# Patient Record
Sex: Male | Born: 1950 | Race: Black or African American | Hispanic: No | Marital: Married | Smoking: Former smoker
Health system: Southern US, Community
[De-identification: ages and names within clinical notes are randomized; demographics above are authoritative.]

## PROBLEM LIST (undated history)

## (undated) DIAGNOSIS — R0602 Shortness of breath: Secondary | ICD-10-CM

## (undated) DIAGNOSIS — I639 Cerebral infarction, unspecified: Secondary | ICD-10-CM

## (undated) DIAGNOSIS — I10 Essential (primary) hypertension: Secondary | ICD-10-CM

## (undated) DIAGNOSIS — R569 Unspecified convulsions: Secondary | ICD-10-CM

## (undated) DIAGNOSIS — R51 Headache: Secondary | ICD-10-CM

## (undated) DIAGNOSIS — F1010 Alcohol abuse, uncomplicated: Secondary | ICD-10-CM

## (undated) DIAGNOSIS — D649 Anemia, unspecified: Secondary | ICD-10-CM

## (undated) DIAGNOSIS — T8859XA Other complications of anesthesia, initial encounter: Secondary | ICD-10-CM

## (undated) DIAGNOSIS — J449 Chronic obstructive pulmonary disease, unspecified: Secondary | ICD-10-CM

## (undated) DIAGNOSIS — N189 Chronic kidney disease, unspecified: Secondary | ICD-10-CM

## (undated) DIAGNOSIS — M4800 Spinal stenosis, site unspecified: Secondary | ICD-10-CM

## (undated) DIAGNOSIS — K219 Gastro-esophageal reflux disease without esophagitis: Secondary | ICD-10-CM

## (undated) DIAGNOSIS — Z9889 Other specified postprocedural states: Secondary | ICD-10-CM

## (undated) DIAGNOSIS — F32A Depression, unspecified: Secondary | ICD-10-CM

## (undated) HISTORY — PX: HERNIA REPAIR: SHX51

## (undated) HISTORY — PX: COLON SURGERY: SHX602

---
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
## 2010-03-25 ENCOUNTER — Inpatient Hospital Stay (HOSPITAL_COMMUNITY)
Admission: EM | Admit: 2010-03-25 | Discharge: 2010-03-27 | Payer: Self-pay | Source: Home / Self Care | Attending: Pulmonary Disease | Admitting: Pulmonary Disease

## 2010-03-26 ENCOUNTER — Encounter (INDEPENDENT_AMBULATORY_CARE_PROVIDER_SITE_OTHER): Payer: Self-pay | Admitting: Pulmonary Disease

## 2010-06-09 LAB — CULTURE, BLOOD (ROUTINE X 2): Culture  Setup Time: 201112272320

## 2010-06-09 LAB — BASIC METABOLIC PANEL
BUN: 17 mg/dL (ref 6–23)
BUN: 19 mg/dL (ref 6–23)
BUN: 26 mg/dL — ABNORMAL HIGH (ref 6–23)
BUN: 35 mg/dL — ABNORMAL HIGH (ref 6–23)
BUN: 45 mg/dL — ABNORMAL HIGH (ref 6–23)
BUN: 8 mg/dL (ref 6–23)
CO2: 14 mEq/L — ABNORMAL LOW (ref 19–32)
CO2: 17 mEq/L — ABNORMAL LOW (ref 19–32)
CO2: 18 mEq/L — ABNORMAL LOW (ref 19–32)
CO2: 19 mEq/L (ref 19–32)
CO2: 19 mEq/L (ref 19–32)
CO2: 7 mEq/L — CL (ref 19–32)
Calcium: 8.8 mg/dL (ref 8.4–10.5)
Calcium: 8.8 mg/dL (ref 8.4–10.5)
Calcium: 8.9 mg/dL (ref 8.4–10.5)
Calcium: 8.9 mg/dL (ref 8.4–10.5)
Chloride: 108 mEq/L (ref 96–112)
Chloride: 111 mEq/L (ref 96–112)
Chloride: 117 mEq/L — ABNORMAL HIGH (ref 96–112)
Chloride: 118 mEq/L — ABNORMAL HIGH (ref 96–112)
Creatinine, Ser: 1.07 mg/dL (ref 0.4–1.5)
Creatinine, Ser: 1.19 mg/dL (ref 0.4–1.5)
Creatinine, Ser: 1.44 mg/dL (ref 0.4–1.5)
Creatinine, Ser: 2.14 mg/dL — ABNORMAL HIGH (ref 0.4–1.5)
GFR calc Af Amer: >60 mL/min (ref >60)
GFR calc non Af Amer: 41 mL/min — ABNORMAL LOW (ref >60)
GFR calc non Af Amer: >60 mL/min (ref >60)
GFR calc non Af Amer: >60 mL/min (ref >60)
GFR calc non Af Amer: >60 mL/min (ref >60)
Glucose, Bld: 107 mg/dL — ABNORMAL HIGH (ref 70–99)
Glucose, Bld: 159 mg/dL — ABNORMAL HIGH (ref 70–99)
Glucose, Bld: 161 mg/dL — ABNORMAL HIGH (ref 70–99)
Glucose, Bld: 190 mg/dL — ABNORMAL HIGH (ref 70–99)
Glucose, Bld: 254 mg/dL — ABNORMAL HIGH (ref 70–99)
Glucose, Bld: 92 mg/dL (ref 70–99)
Potassium: 3.1 mEq/L — ABNORMAL LOW (ref 3.5–5.1)
Potassium: 3.5 mEq/L (ref 3.5–5.1)
Potassium: 4.4 mEq/L (ref 3.5–5.1)
Sodium: 137 mEq/L (ref 135–145)
Sodium: 137 mEq/L (ref 135–145)
Sodium: 145 mEq/L (ref 135–145)

## 2010-06-09 LAB — RAPID URINE DRUG SCREEN, HOSP PERFORMED
Amphetamines: NOT DETECTED
Barbiturates: NOT DETECTED
Opiates: NOT DETECTED
Tetrahydrocannabinol: NOT DETECTED

## 2010-06-09 LAB — COMPREHENSIVE METABOLIC PANEL
ALT: 21 U/L (ref 0–53)
Albumin: 3.6 g/dL (ref 3.5–5.2)
BUN: 53 mg/dL — ABNORMAL HIGH (ref 6–23)
Calcium: 9.7 mg/dL (ref 8.4–10.5)
Total Bilirubin: 1.9 mg/dL — ABNORMAL HIGH (ref 0.3–1.2)
Total Protein: 8.3 g/dL (ref 6.0–8.3)

## 2010-06-09 LAB — URINE CULTURE

## 2010-06-09 LAB — PROTIME-INR
INR: 1.03 (ref 0.00–1.49)
Prothrombin Time: 13.7 seconds (ref 11.6–15.2)

## 2010-06-09 LAB — GLUCOSE, CAPILLARY
Glucose-Capillary: 124 mg/dL — ABNORMAL HIGH (ref 70–99)
Glucose-Capillary: 127 mg/dL — ABNORMAL HIGH (ref 70–99)
Glucose-Capillary: 150 mg/dL — ABNORMAL HIGH (ref 70–99)
Glucose-Capillary: 157 mg/dL — ABNORMAL HIGH (ref 70–99)
Glucose-Capillary: 159 mg/dL — ABNORMAL HIGH (ref 70–99)
Glucose-Capillary: 162 mg/dL — ABNORMAL HIGH (ref 70–99)
Glucose-Capillary: 171 mg/dL — ABNORMAL HIGH (ref 70–99)
Glucose-Capillary: 196 mg/dL — ABNORMAL HIGH (ref 70–99)
Glucose-Capillary: 208 mg/dL — ABNORMAL HIGH (ref 70–99)
Glucose-Capillary: 246 mg/dL — ABNORMAL HIGH (ref 70–99)
Glucose-Capillary: 255 mg/dL — ABNORMAL HIGH (ref 70–99)
Glucose-Capillary: 269 mg/dL — ABNORMAL HIGH (ref 70–99)
Glucose-Capillary: 353 mg/dL — ABNORMAL HIGH (ref 70–99)
Glucose-Capillary: 368 mg/dL — ABNORMAL HIGH (ref 70–99)
Glucose-Capillary: >600 mg/dL (ref 70–99)

## 2010-06-09 LAB — CBC
HCT: 40.3 % (ref 39.0–52.0)
Hemoglobin: 14 g/dL (ref 13.0–17.0)
Hemoglobin: 16.1 g/dL (ref 13.0–17.0)
MCH: 32.4 pg (ref 26.0–34.0)
MCHC: 33.9 g/dL (ref 30.0–36.0)
MCV: 92 fL (ref 78.0–100.0)
Platelets: 245 10*3/uL (ref 150–400)
RBC: 4.38 MIL/uL (ref 4.22–5.81)
RBC: 4.97 MIL/uL (ref 4.22–5.81)
RDW: 13.7 % (ref 11.5–15.5)
WBC: 14.8 10*3/uL — ABNORMAL HIGH (ref 4.0–10.5)
WBC: 19.7 10*3/uL — ABNORMAL HIGH (ref 4.0–10.5)

## 2010-06-09 LAB — POCT I-STAT, CHEM 8
BUN: 53 mg/dL — ABNORMAL HIGH (ref 6–23)
Calcium, Ion: 1.14 mmol/L (ref 1.12–1.32)
Glucose, Bld: 691 mg/dL (ref 70–99)
Hemoglobin: 17.3 g/dL — ABNORMAL HIGH (ref 13.0–17.0)
Sodium: 131 mEq/L — ABNORMAL LOW (ref 135–145)
TCO2: 8 mmol/L (ref 0–100)

## 2010-06-09 LAB — BRAIN NATRIURETIC PEPTIDE: Pro B Natriuretic peptide (BNP): 66 pg/mL (ref 0.0–100.0)

## 2010-06-09 LAB — DIFFERENTIAL
Basophils Absolute: 0 10*3/uL (ref 0.0–0.1)
Eosinophils Relative: 0 % (ref 0–5)
Lymphocytes Relative: 10 % — ABNORMAL LOW (ref 12–46)

## 2010-06-09 LAB — AMYLASE: Amylase: 146 U/L — ABNORMAL HIGH (ref 0–105)

## 2010-06-09 LAB — PHOSPHORUS: Phosphorus: 3.6 mg/dL (ref 2.3–4.6)

## 2010-06-09 LAB — CARDIAC PANEL(CRET KIN+CKTOT+MB+TROPI)
Relative Index: 2.8 — ABNORMAL HIGH (ref 0.0–2.5)
Troponin I: 0.06 ng/mL (ref 0.00–0.06)

## 2010-06-09 LAB — POCT I-STAT 3, ART BLOOD GAS (G3+)
TCO2: 5 mmol/L (ref 0–100)
pCO2 arterial: 13.4 mmHg — CL (ref 35.0–45.0)
pO2, Arterial: 148 mmHg — ABNORMAL HIGH (ref 80.0–100.0)

## 2010-06-09 LAB — MAGNESIUM: Magnesium: 1.8 mg/dL (ref 1.5–2.5)

## 2010-06-09 LAB — LEGIONELLA ANTIGEN, URINE

## 2010-06-09 LAB — URINALYSIS, ROUTINE W REFLEX MICROSCOPIC: Leukocytes, UA: NEGATIVE

## 2010-06-09 LAB — TROPONIN I: Troponin I: 0.06 ng/mL (ref 0.00–0.06)

## 2010-06-09 LAB — URINE MICROSCOPIC-ADD ON

## 2010-06-09 LAB — CK TOTAL AND CKMB (NOT AT ARMC): Total CK: 282 U/L — ABNORMAL HIGH (ref 7–232)

## 2010-06-26 ENCOUNTER — Encounter (INDEPENDENT_AMBULATORY_CARE_PROVIDER_SITE_OTHER): Payer: Self-pay | Admitting: Family Medicine

## 2010-06-26 LAB — CONVERTED CEMR LAB
ALT: 15 units/L (ref 0–53)
AST: 24 units/L (ref 0–37)
Albumin: 4.3 g/dL (ref 3.5–5.2)
Alkaline Phosphatase: 49 units/L (ref 39–117)
LDL Cholesterol: 99 mg/dL (ref 0–99)
PSA: 1.78 ng/mL (ref <=4.00)
Potassium: 4.8 meq/L (ref 3.5–5.3)
Sodium: 139 meq/L (ref 135–145)
Total Bilirubin: 0.6 mg/dL (ref 0.3–1.2)
Total Protein: 6.9 g/dL (ref 6.0–8.3)
Triglycerides: 57 mg/dL (ref <150)
VLDL: 11 mg/dL (ref 0–40)

## 2011-06-04 ENCOUNTER — Observation Stay (HOSPITAL_COMMUNITY)
Admission: EM | Admit: 2011-06-04 | Discharge: 2011-06-04 | Disposition: A | Discharge status: Home Health | Payer: Self-pay | Attending: Emergency Medicine | Admitting: Emergency Medicine

## 2011-06-04 ENCOUNTER — Encounter (HOSPITAL_COMMUNITY): Payer: Self-pay | Admitting: Emergency Medicine

## 2011-06-04 DIAGNOSIS — Z9119 Patient's noncompliance with other medical treatment and regimen: Secondary | ICD-10-CM | POA: Insufficient documentation

## 2011-06-04 DIAGNOSIS — Z794 Long term (current) use of insulin: Secondary | ICD-10-CM | POA: Insufficient documentation

## 2011-06-04 DIAGNOSIS — E860 Dehydration: Principal | ICD-10-CM | POA: Insufficient documentation

## 2011-06-04 DIAGNOSIS — I10 Essential (primary) hypertension: Secondary | ICD-10-CM | POA: Insufficient documentation

## 2011-06-04 DIAGNOSIS — R112 Nausea with vomiting, unspecified: Secondary | ICD-10-CM | POA: Insufficient documentation

## 2011-06-04 DIAGNOSIS — Z79899 Other long term (current) drug therapy: Secondary | ICD-10-CM | POA: Insufficient documentation

## 2011-06-04 DIAGNOSIS — Z91199 Patient's noncompliance with other medical treatment and regimen due to unspecified reason: Secondary | ICD-10-CM | POA: Insufficient documentation

## 2011-06-04 DIAGNOSIS — E119 Type 2 diabetes mellitus without complications: Secondary | ICD-10-CM | POA: Insufficient documentation

## 2011-06-04 DIAGNOSIS — R51 Headache: Secondary | ICD-10-CM | POA: Insufficient documentation

## 2011-06-04 HISTORY — DX: Essential (primary) hypertension: I10

## 2011-06-04 LAB — POCT I-STAT 3, VENOUS BLOOD GAS (G3P V)
Bicarbonate: 27.3 mEq/L — ABNORMAL HIGH (ref 20.0–24.0)
TCO2: 29 mmol/L (ref 0–100)
pH, Ven: 7.354 — ABNORMAL HIGH (ref 7.250–7.300)
pO2, Ven: 19 mmHg — CL (ref 30.0–45.0)

## 2011-06-04 LAB — DIFFERENTIAL
Basophils Absolute: 0 10*3/uL (ref 0.0–0.1)
Basophils Relative: 0 % (ref 0–1)
Eosinophils Absolute: 0.1 10*3/uL (ref 0.0–0.7)
Monocytes Relative: 7 % (ref 3–12)
Neutro Abs: 4.1 10*3/uL (ref 1.7–7.7)
Neutrophils Relative %: 66 % (ref 43–77)

## 2011-06-04 LAB — GLUCOSE, RANDOM: Glucose, Bld: 228 mg/dL — ABNORMAL HIGH (ref 70–99)

## 2011-06-04 LAB — GLUCOSE, CAPILLARY
Glucose-Capillary: 211 mg/dL — ABNORMAL HIGH (ref 70–99)
Glucose-Capillary: 211 mg/dL — ABNORMAL HIGH (ref 70–99)

## 2011-06-04 LAB — URINE MICROSCOPIC-ADD ON

## 2011-06-04 LAB — COMPREHENSIVE METABOLIC PANEL
AST: 15 U/L (ref 0–37)
Albumin: 3.7 g/dL (ref 3.5–5.2)
Alkaline Phosphatase: 78 U/L (ref 39–117)
BUN: 14 mg/dL (ref 6–23)
Chloride: 94 mEq/L — ABNORMAL LOW (ref 96–112)
Creatinine, Ser: 0.99 mg/dL (ref 0.50–1.35)
Potassium: 3.8 mEq/L (ref 3.5–5.1)
Total Bilirubin: 0.5 mg/dL (ref 0.3–1.2)
Total Protein: 7.5 g/dL (ref 6.0–8.3)

## 2011-06-04 LAB — CBC
MCH: 33 pg (ref 26.0–34.0)
MCHC: 35.6 g/dL (ref 30.0–36.0)
RDW: 13.4 % (ref 11.5–15.5)

## 2011-06-04 LAB — URINALYSIS, ROUTINE W REFLEX MICROSCOPIC
Glucose, UA: >1000 mg/dL — AB
Leukocytes, UA: NEGATIVE
pH: 6.5 (ref 5.0–8.0)

## 2011-06-04 MED ORDER — SODIUM CHLORIDE 0.9 % IV SOLN
INTRAVENOUS | Status: DC
  Administered 2011-06-04: 12:00:00 via INTRAVENOUS

## 2011-06-04 MED ORDER — SODIUM CHLORIDE 0.9 % IV BOLUS (SEPSIS)
1000.0000 mL | Freq: Once | INTRAVENOUS | Status: AC
  Administered 2011-06-04: 1000 mL via INTRAVENOUS

## 2011-06-04 MED ORDER — ONDANSETRON HCL 4 MG/2ML IJ SOLN
4.0000 mg | Freq: Once | INTRAMUSCULAR | Status: AC
  Administered 2011-06-04: 4 mg via INTRAVENOUS
  Filled 2011-06-04: qty 2

## 2011-06-04 MED ORDER — SODIUM CHLORIDE 0.9 % IV SOLN
INTRAVENOUS | Status: DC
  Filled 2011-06-04: qty 1

## 2011-06-04 MED ORDER — SODIUM CHLORIDE 0.9 % IV SOLN: INTRAVENOUS | Status: DC

## 2011-06-04 MED ORDER — ACETAMINOPHEN 500 MG PO TABS
1000.0000 mg | ORAL_TABLET | ORAL | Status: AC
  Administered 2011-06-04: 1000 mg via ORAL
  Filled 2011-06-04: qty 2

## 2011-06-04 MED ORDER — ONDANSETRON HCL 4 MG/2ML IJ SOLN: 4.0000 mg | Freq: Four times a day (QID) | INTRAMUSCULAR | Status: DC | PRN

## 2011-06-04 MED ORDER — METFORMIN HCL 1000 MG PO TABS: 1000.0000 mg | ORAL_TABLET | Freq: Two times a day (BID) | ORAL | Status: DC

## 2011-06-04 MED ORDER — DEXTROSE 50 % IV SOLN: 25.0000 mL | INTRAVENOUS | Status: DC | PRN

## 2011-06-04 MED ORDER — DEXTROSE-NACL 5-0.45 % IV SOLN: INTRAVENOUS | Status: DC

## 2011-06-04 MED ORDER — HYDROMORPHONE HCL PF 1 MG/ML IJ SOLN
1.0000 mg | Freq: Once | INTRAMUSCULAR | Status: AC
  Administered 2011-06-04: 1 mg via INTRAVENOUS
  Filled 2011-06-04: qty 1

## 2011-06-04 MED ORDER — ONDANSETRON HCL 4 MG/2ML IJ SOLN
4.0000 mg | Freq: Once | INTRAMUSCULAR | Status: AC
  Administered 2011-06-04: 4 mg via INTRAVENOUS

## 2011-06-04 MED ORDER — GLYBURIDE 5 MG PO TABS: 5.0000 mg | ORAL_TABLET | Freq: Two times a day (BID) | ORAL | Status: DC

## 2011-06-04 MED ORDER — LABETALOL HCL 100 MG PO TABS: 100.0000 mg | ORAL_TABLET | Freq: Two times a day (BID) | ORAL | Status: DC

## 2011-06-04 MED ORDER — LOSARTAN POTASSIUM-HCTZ 50-12.5 MG PO TABS: 1.0000 | ORAL_TABLET | Freq: Every day | ORAL | Status: DC

## 2011-06-04 MED ORDER — ONDANSETRON HCL 4 MG/2ML IJ SOLN
INTRAMUSCULAR | Status: AC
  Administered 2011-06-04: 4 mg via INTRAVENOUS
  Filled 2011-06-04: qty 2

## 2011-06-04 MED ORDER — INSULIN REGULAR BOLUS VIA INFUSION
0.0000 [IU] | Freq: Three times a day (TID) | INTRAVENOUS | Status: DC
  Filled 2011-06-04: qty 10

## 2011-06-04 NOTE — ED Notes (Signed)
Pt was moved to CDU 1 , pt claimed that his headache is better, denies any nausea at present. Family is with the patient

## 2011-06-04 NOTE — ED Notes (Signed)
MD at bedside. 

## 2011-06-04 NOTE — ED Notes (Signed)
cbg 211

## 2011-06-04 NOTE — ED Provider Notes (Signed)
History     CSN: [id number]  Arrival date & time 06/04/11  0711   First MD Initiated Contact with Patient 06/04/11 [phone]      Chief Complaint  Patient presents with  . Hyperglycemia    HPI The patient presents with headache and concerns over his hyperglycemia.  The patient is an insulin-dependent diabetic.  He notes that over the past weeks, possibly for as long as one month he has not been able to obtain all his medication.  He notes no notable changes in his health until yesterday, when he gradually developed a headache.  This occurred approximately 12 hours ago.  Since onset the headache has been persistent, diffuse, throbbing.  No relief with anything.  No confusion, no disorientation.  Patient does complain of nausea, and vomited one time, just prior to arrival.  He denies any new abdominal pain, dyspnea chest pain, ataxia, extremity weakness. Notably, the patient had one similar episode in the distant past, but notes that he generally is in good health. Past Medical History  Diagnosis Date  . Diabetes mellitus   . Hypertension     History reviewed. No pertinent past surgical history.  No family history on file.  History  Substance Use Topics  . Smoking status: Never Smoker   . Smokeless tobacco: Not on file  . Alcohol Use: Yes      Review of Systems  Constitutional:       Per HPI, otherwise negative  HENT:       Per HPI, otherwise negative  Eyes: Negative.   Respiratory:       Per HPI, otherwise negative  Cardiovascular:       Per HPI, otherwise negative  Gastrointestinal: Positive for nausea and vomiting. Negative for diarrhea.  Genitourinary: Negative.   Musculoskeletal:       Per HPI, otherwise negative  Skin: Negative.   Neurological: Negative for syncope.    Allergies  Other  Home Medications   Current Outpatient Rx  Name Route Sig Dispense Refill  . GLYBURIDE 5 MG PO TABS Oral Take 5 mg by mouth 2 (two) times daily with a meal.    . INSULIN  ISOPHANE & REGULAR (70-30) 100 UNIT/ML [place] SUSP Subcutaneous Inject 10 Units into the skin 2 (two) times daily with a meal.    . LABETALOL HCL 100 MG PO TABS Oral Take 100 mg by mouth 2 (two) times daily.    Marland Kitchen LOSARTAN POTASSIUM-HCTZ 50-12.5 MG PO TABS Oral Take 1 tablet by mouth daily.    Marland Kitchen METFORMIN HCL 500 MG PO TABS Oral Take 1,000 mg by mouth 2 (two) times daily with a meal.      BP 184/102  Pulse 92  Temp(Src) 97.4 F (36.3 C) (Oral)  Resp 20  SpO2 100%  Physical Exam  Nursing note and vitals reviewed. Constitutional: He is oriented to person, place, and time. He appears well-developed. No distress.  HENT:  Head: Normocephalic and atraumatic.  Eyes: Conjunctivae and EOM are normal.  Cardiovascular: Normal rate and regular rhythm.   Pulmonary/Chest: Effort normal. No stridor. No respiratory distress.  Abdominal: He exhibits no distension.  Musculoskeletal: He exhibits no edema.  Neurological: He is alert and oriented to person, place, and time. He exhibits normal muscle tone. Coordination normal.  Skin: Skin is warm and dry.  Psychiatric: He has a normal mood and affect.    ED Course  Procedures (including critical care time)  Labs Reviewed  GLUCOSE, CAPILLARY - Abnormal; Notable for  the following:    Glucose-Capillary 211 (*)    All other components within normal limits  CBC  DIFFERENTIAL  COMPREHENSIVE METABOLIC PANEL  URINALYSIS, ROUTINE W REFLEX MICROSCOPIC   No results found.   No diagnosis found.  Pulse oximetry 100% room air normal   MDM  This insulin-dependent diabetic presents with concerns over hyperglycemia, headache.  The patient notes a blood glucose of nearly 500 at home prior to arrival.  On my exam the patient is in no distress, though he is mildly hypertensive.  Patient's initial blood glucose here is greater than 250.  Patient is no neurologic findings.  The patient's labs are notable for electrolyte abnormalities consistent with dehydration,  prolonged hyperglycemia, though the patient is not notably acidotic.  The patient's endorsement of chronic medication noncompliance is consistent with these findings.  The patient had some relief of his headache with fluids, narcotics, but remained symptomatically after initial IV fluid resuscitation.  This progression, or lack thereof is consistent with the aforementioned medication noncompliance, dehydration.  Although the patient's symptoms are likely directly attributable to his hyperglycemia, he does not meet the strict criteria for hyperglycemia protocol.  He was transferred to the clinical decision unit under the dehydration protocol, under which he qualifies.  He was transferred to this unit in stable condition.       Carmin Muskrat, MD 06/04/11 [phone]

## 2011-06-04 NOTE — ED Provider Notes (Signed)
Please see my initial note.  Carmin Muskrat, MD 06/04/11 1526

## 2011-06-04 NOTE — ED Notes (Signed)
Patient is resting comfortably. 

## 2011-06-04 NOTE — ED Provider Notes (Signed)
Patient was sent to CDU for holding for ongoing IV fluid hydration and for ongoing pain management of his headache. Patient states his headache has resolved. He has received 2 and half liters of fluid thus far. His blood sugars have decreased significantly. Patient is requesting refills of his diabetes by mouth medications as well as his hypertension medications. Patient states she's feeling much better in light home. He is afebrile, nontoxic-appearing, no neuro focal findings. He denies chest pain or abdominal pain.Eben Burow, PA 06/04/11 1144

## 2011-06-04 NOTE — ED Notes (Signed)
PT. REPORTS ELEVATED BLOOD SUGAR THIS MORNING AT HOME = 478 , ALSO REPORTS HEADACHE / LEFT EYE PAIN .

## 2011-06-04 NOTE — ED Notes (Signed)
Pt cbg 254 

## 2011-06-04 NOTE — ED Notes (Signed)
Pt received to RM 9 with c/o headache onset last night, vomiting this morning and elevated blood sugar x a couple of days. Pt denies any abdominal pain, no fever nor diarrhea. Pt is A/A/Ox4, skin is warm and dry, respiration is even and unlabored. Family is at the bedside.

## 2011-06-04 NOTE — ED Notes (Signed)
Report given to Annette RN

## 2011-06-04 NOTE — Discharge Instructions (Signed)
It is very important to follow up with Dr Leward Quan for ongoing management of your diabetes and high blood pressure but return to ER for emergent changing or worsening of symptoms.   Diabetes, Frequently Asked Questions WHAT IS DIABETES? Most of the food we eat is turned into glucose (sugar). Our bodies use it for energy. The pancreas makes a hormone called insulin. It helps glucose get into the cells of our bodies. When you have diabetes, your body either does not make enough insulin or cannot use its own insulin as well as it should. This causes sugars to build up in your blood. WHAT ARE THE SYMPTOMS OF DIABETES?  Frequent urination.   Excessive thirst.   Unexplained weight loss.   Extreme hunger.   Blurred vision.   Tingling or numbness in hands or feet.   Feeling very tired much of the time.   Dry, itchy skin.   Sores that are slow to heal.   Yeast infections.  WHAT ARE THE TYPES OF DIABETES? Type 1 Diabetes   About 10% of affected people have this type.   Usually occurs before the age of 3.   Usually occurs in thin to normal weight people.  Type 2 Diabetes  About 90% of affected people have this type.   Usually occurs after the age of 53.   Usually occurs in overweight people.   More likely to have:   A family history of diabetes.   A history of diabetes during pregnancy (gestational diabetes).   High blood pressure.   High cholesterol and triglycerides.  Gestational Diabetes  Occurs in about 4% of pregnancies.   Usually goes away after the baby is born.   More likely to occur in women with:   Family history of diabetes.   Previous gestational diabetes.   Obese.   Over 84 years old.  WHAT IS PRE-DIABETES? Pre-diabetes means your blood glucose is higher than normal, but lower than the diabetes range. It also means you are at risk of getting type 2 diabetes and heart disease. If you are told you have pre-diabetes, have your blood glucose checked  again in 1 to 2 years. WHAT IS THE TREATMENT FOR DIABETES? Treatment is aimed at keeping blood glucose near normal levels at all times. Learning how to manage this yourself is important in treating diabetes. Depending on the type of diabetes you have, your treatment will include one or more of the following:  Monitoring your blood glucose.   Meal planning.   Exercise.   Oral medicine (pills) or insulin.  CAN DIABETES BE PREVENTED? With type 1 diabetes, prevention is more difficult, because the triggers that cause it are not yet known. With type 2 diabetes, prevention is more likely, with lifestyle changes:  Maintain a healthy weight.   Eat healthy.   Exercise.  IS THERE A CURE FOR DIABETES? No, there is no cure for diabetes. There is a lot of research going on that is looking for a cure, and progress is being made. Diabetes can be treated and controlled. People with diabetes can manage their diabetes and lead normal, active lives. SHOULD I BE TESTED FOR DIABETES? If you are at least 61 years old, you should be tested for diabetes. You should be tested again every 3 years. If you are 59 or older and overweight, you may want to get tested more often. If you are younger than 30, overweight, and have one or more of the following risk factors, you should be tested:  Family history of diabetes.   Inactive lifestyle.   High blood pressure.  WHAT ARE SOME OTHER SOURCES FOR INFORMATION ON DIABETES? The following organizations may help in your search for more information on diabetes: National Diabetes Education Program (NDEP) Internet: BloggerBowl.es American Diabetes Association Internet: http://www.diabetes.org  Juvenile Diabetes Foundation International Internet: AffordableSalon.es Document Released: 03/19/2003 Document Revised: 03/05/2011 Document Reviewed: 01/11/2009 [facility] Patient Information 2012 Lupus.

## 2011-08-19 ENCOUNTER — Observation Stay (HOSPITAL_COMMUNITY)
Admission: EM | Admit: 2011-08-19 | Discharge: 2011-08-21 | Disposition: A | Discharge status: Home / Self Care | Payer: Self-pay | Attending: Internal Medicine | Admitting: Internal Medicine

## 2011-08-19 ENCOUNTER — Inpatient Hospital Stay (HOSPITAL_COMMUNITY): Payer: Self-pay

## 2011-08-19 ENCOUNTER — Encounter (HOSPITAL_COMMUNITY): Payer: Self-pay | Admitting: *Deleted

## 2011-08-19 ENCOUNTER — Emergency Department (HOSPITAL_COMMUNITY): Payer: Self-pay

## 2011-08-19 DIAGNOSIS — F17200 Nicotine dependence, unspecified, uncomplicated: Secondary | ICD-10-CM | POA: Insufficient documentation

## 2011-08-19 DIAGNOSIS — Z23 Encounter for immunization: Secondary | ICD-10-CM | POA: Insufficient documentation

## 2011-08-19 DIAGNOSIS — F1010 Alcohol abuse, uncomplicated: Secondary | ICD-10-CM | POA: Insufficient documentation

## 2011-08-19 DIAGNOSIS — I10 Essential (primary) hypertension: Secondary | ICD-10-CM | POA: Insufficient documentation

## 2011-08-19 DIAGNOSIS — E1165 Type 2 diabetes mellitus with hyperglycemia: Secondary | ICD-10-CM

## 2011-08-19 DIAGNOSIS — R5381 Other malaise: Secondary | ICD-10-CM | POA: Insufficient documentation

## 2011-08-19 DIAGNOSIS — R55 Syncope and collapse: Principal | ICD-10-CM | POA: Insufficient documentation

## 2011-08-19 DIAGNOSIS — R0602 Shortness of breath: Secondary | ICD-10-CM | POA: Insufficient documentation

## 2011-08-19 DIAGNOSIS — E119 Type 2 diabetes mellitus without complications: Secondary | ICD-10-CM | POA: Diagnosis present

## 2011-08-19 DIAGNOSIS — R51 Headache: Secondary | ICD-10-CM | POA: Insufficient documentation

## 2011-08-19 HISTORY — DX: Headache: R51

## 2011-08-19 HISTORY — DX: Shortness of breath: R06.02

## 2011-08-19 HISTORY — DX: Gastro-esophageal reflux disease without esophagitis: K21.9

## 2011-08-19 LAB — CARDIAC PANEL(CRET KIN+CKTOT+MB+TROPI)
CK, MB: 1.9 ng/mL (ref 0.3–4.0)
Relative Index: INVALID (ref 0.0–2.5)
Total CK: 69 U/L (ref 7–232)
Total CK: 69 U/L (ref 7–232)
Troponin I: <0.3 ng/mL (ref <0.30)

## 2011-08-19 LAB — GLUCOSE, CAPILLARY
Glucose-Capillary: 327 mg/dL — ABNORMAL HIGH (ref 70–99)
Glucose-Capillary: 206 mg/dL — ABNORMAL HIGH (ref 70–99)
Glucose-Capillary: 327 mg/dL — ABNORMAL HIGH (ref 70–99)

## 2011-08-19 LAB — CBC
Hemoglobin: 14 g/dL (ref 13.0–17.0)
Hemoglobin: 14.1 g/dL (ref 13.0–17.0)
MCH: 32 pg (ref 26.0–34.0)
MCH: 32.2 pg (ref 26.0–34.0)
MCHC: 35.3 g/dL (ref 30.0–36.0)
MCV: 91.1 fL (ref 78.0–100.0)
Platelets: 249 10*3/uL (ref 150–400)
RBC: 4.38 MIL/uL (ref 4.22–5.81)
RBC: 4.38 MIL/uL (ref 4.22–5.81)
WBC: 3.6 10*3/uL — ABNORMAL LOW (ref 4.0–10.5)

## 2011-08-19 LAB — DIFFERENTIAL
Eosinophils Absolute: 0 10*3/uL (ref 0.0–0.7)
Lymphs Abs: 1.6 10*3/uL (ref 0.7–4.0)
Neutro Abs: 1.6 10*3/uL — ABNORMAL LOW (ref 1.7–7.7)
Neutrophils Relative %: 43 % (ref 43–77)

## 2011-08-19 LAB — BASIC METABOLIC PANEL
CO2: 27 mEq/L (ref 19–32)
Calcium: 9.6 mg/dL (ref 8.4–10.5)
Chloride: 93 mEq/L — ABNORMAL LOW (ref 96–112)
Creatinine, Ser: 0.98 mg/dL (ref 0.50–1.35)
Glucose, Bld: 305 mg/dL — ABNORMAL HIGH (ref 70–99)

## 2011-08-19 LAB — CREATININE, SERUM: Creatinine, Ser: 0.97 mg/dL (ref 0.50–1.35)

## 2011-08-19 MED ORDER — LABETALOL HCL 100 MG PO TABS
100.0000 mg | ORAL_TABLET | Freq: Two times a day (BID) | ORAL | Status: DC
  Administered 2011-08-19 – 2011-08-21 (×4): 100 mg via ORAL
  Filled 2011-08-19 (×5): qty 1

## 2011-08-19 MED ORDER — ENOXAPARIN SODIUM 40 MG/0.4ML ~~LOC~~ SOLN
40.0000 mg | SUBCUTANEOUS | Status: DC
  Administered 2011-08-19 – 2011-08-20 (×2): 40 mg via SUBCUTANEOUS
  Filled 2011-08-19 (×3): qty 0.4

## 2011-08-19 MED ORDER — HYDROCHLOROTHIAZIDE 12.5 MG PO CAPS
12.5000 mg | ORAL_CAPSULE | Freq: Every day | ORAL | Status: DC
  Administered 2011-08-19 – 2011-08-21 (×3): 12.5 mg via ORAL
  Filled 2011-08-19 (×3): qty 1

## 2011-08-19 MED ORDER — VITAMIN B-1 100 MG PO TABS
100.0000 mg | ORAL_TABLET | Freq: Every day | ORAL | Status: DC
  Administered 2011-08-19 – 2011-08-21 (×3): 100 mg via ORAL
  Filled 2011-08-19 (×3): qty 1

## 2011-08-19 MED ORDER — SODIUM CHLORIDE 0.9 % IJ SOLN: 3.0000 mL | Freq: Two times a day (BID) | INTRAMUSCULAR | Status: DC

## 2011-08-19 MED ORDER — ACETAMINOPHEN 325 MG PO TABS: 650.0000 mg | ORAL_TABLET | Freq: Four times a day (QID) | ORAL | Status: DC | PRN

## 2011-08-19 MED ORDER — PNEUMOCOCCAL VAC POLYVALENT 25 MCG/0.5ML IJ INJ
0.5000 mL | INJECTION | Freq: Once | INTRAMUSCULAR | Status: AC
  Administered 2011-08-19: 0.5 mL via INTRAMUSCULAR
  Filled 2011-08-19: qty 0.5

## 2011-08-19 MED ORDER — LOSARTAN POTASSIUM 50 MG PO TABS
50.0000 mg | ORAL_TABLET | Freq: Every day | ORAL | Status: DC
  Administered 2011-08-19 – 2011-08-21 (×3): 50 mg via ORAL
  Filled 2011-08-19 (×3): qty 1

## 2011-08-19 MED ORDER — SODIUM CHLORIDE 0.9 % IV SOLN: 250.0000 mL | INTRAVENOUS | Status: DC | PRN

## 2011-08-19 MED ORDER — SODIUM CHLORIDE 0.9 % IJ SOLN
3.0000 mL | Freq: Two times a day (BID) | INTRAMUSCULAR | Status: DC
  Administered 2011-08-19 – 2011-08-21 (×4): 3 mL via INTRAVENOUS

## 2011-08-19 MED ORDER — ALUM & MAG HYDROXIDE-SIMETH 200-200-20 MG/5ML PO SUSP: 30.0000 mL | Freq: Four times a day (QID) | ORAL | Status: DC | PRN

## 2011-08-19 MED ORDER — ONDANSETRON HCL 4 MG/2ML IJ SOLN: 4.0000 mg | Freq: Four times a day (QID) | INTRAMUSCULAR | Status: DC | PRN

## 2011-08-19 MED ORDER — FOLIC ACID 1 MG PO TABS
1.0000 mg | ORAL_TABLET | Freq: Every day | ORAL | Status: DC
  Administered 2011-08-19 – 2011-08-21 (×3): 1 mg via ORAL
  Filled 2011-08-19 (×3): qty 1

## 2011-08-19 MED ORDER — LOSARTAN POTASSIUM-HCTZ 50-12.5 MG PO TABS: 1.0000 | ORAL_TABLET | Freq: Every day | ORAL | Status: DC

## 2011-08-19 MED ORDER — ACETAMINOPHEN 650 MG RE SUPP: 650.0000 mg | Freq: Four times a day (QID) | RECTAL | Status: DC | PRN

## 2011-08-19 MED ORDER — INSULIN ASPART 100 UNIT/ML ~~LOC~~ SOLN: 0.0000 [IU] | Freq: Three times a day (TID) | SUBCUTANEOUS | Status: DC

## 2011-08-19 MED ORDER — ASPIRIN EC 81 MG PO TBEC
81.0000 mg | DELAYED_RELEASE_TABLET | Freq: Every day | ORAL | Status: DC
  Administered 2011-08-19 – 2011-08-21 (×3): 81 mg via ORAL
  Filled 2011-08-19 (×3): qty 1

## 2011-08-19 MED ORDER — INSULIN GLARGINE 100 UNIT/ML ~~LOC~~ SOLN
5.0000 [IU] | Freq: Every day | SUBCUTANEOUS | Status: DC
  Administered 2011-08-19: 5 [IU] via SUBCUTANEOUS

## 2011-08-19 MED ORDER — INSULIN ASPART 100 UNIT/ML ~~LOC~~ SOLN
0.0000 [IU] | Freq: Three times a day (TID) | SUBCUTANEOUS | Status: DC
  Administered 2011-08-19: 3 [IU] via SUBCUTANEOUS
  Administered 2011-08-20: 5 [IU] via SUBCUTANEOUS

## 2011-08-19 MED ORDER — ADULT MULTIVITAMIN W/MINERALS CH
1.0000 | ORAL_TABLET | Freq: Every day | ORAL | Status: DC
  Administered 2011-08-19 – 2011-08-21 (×3): 1 via ORAL
  Filled 2011-08-19 (×3): qty 1

## 2011-08-19 MED ORDER — ONDANSETRON HCL 4 MG PO TABS: 4.0000 mg | ORAL_TABLET | Freq: Four times a day (QID) | ORAL | Status: DC | PRN

## 2011-08-19 MED ORDER — HYDROCODONE-ACETAMINOPHEN 5-325 MG PO TABS: 1.0000 | ORAL_TABLET | ORAL | Status: DC | PRN

## 2011-08-19 MED ORDER — SENNA 8.6 MG PO TABS
1.0000 | ORAL_TABLET | Freq: Two times a day (BID) | ORAL | Status: DC
  Administered 2011-08-19 – 2011-08-21 (×4): 8.6 mg via ORAL
  Filled 2011-08-19 (×5): qty 1

## 2011-08-19 MED ORDER — MORPHINE SULFATE 4 MG/ML IJ SOLN
4.0000 mg | Freq: Once | INTRAMUSCULAR | Status: AC
  Administered 2011-08-19: 4 mg via INTRAVENOUS
  Filled 2011-08-19: qty 1

## 2011-08-19 MED ORDER — INSULIN ASPART 100 UNIT/ML ~~LOC~~ SOLN
0.0000 [IU] | Freq: Once | SUBCUTANEOUS | Status: AC
  Administered 2011-08-19: 7 [IU] via SUBCUTANEOUS

## 2011-08-19 MED ORDER — SODIUM CHLORIDE 0.9 % IJ SOLN: 3.0000 mL | INTRAMUSCULAR | Status: DC | PRN

## 2011-08-19 NOTE — ED Provider Notes (Signed)
History     CSN: [id number]  Arrival date & time 08/19/11  [id number]   First MD Initiated Contact with Patient 08/19/11 [phone]      Chief Complaint  Patient presents with  . Loss of Consciousness  . Dizziness    Patient is a 61 y.o. male presenting with syncope. The history is provided by the patient.  Loss of Consciousness This is a new problem. Episode onset: this morning. The problem occurs constantly. The problem has been gradually improving. Associated symptoms include headaches and shortness of breath. Pertinent negatives include no chest pain and no abdominal pain. The symptoms are aggravated by nothing. The symptoms are relieved by nothing. He has tried rest for the symptoms. The treatment provided mild relief.  This patient reports he got up in the middle of night for water, and the next thing he knew " I was in the floor" He does not recall anything prior to falling He now feels well, but does report mild headache recently and felt SOB while getting water. No current CP No abdominal pain No focal weakness No new visual changes No drug abuse reported He is unsure if he had a seizure  Past Medical History  Diagnosis Date  . Diabetes mellitus   . Hypertension     History reviewed. No pertinent past surgical history.  No family history on file.  History  Substance Use Topics  . Smoking status: Never Smoker   . Smokeless tobacco: Not on file  . Alcohol Use: Yes     occ      Review of Systems  Respiratory: Positive for shortness of breath.   Cardiovascular: Positive for syncope. Negative for chest pain.  Gastrointestinal: Negative for abdominal pain.  Neurological: Positive for headaches.  All other systems reviewed and are negative.    Allergies  Other  Home Medications   Current Outpatient Rx  Name Route Sig Dispense Refill  . INSULIN GLARGINE 100 UNIT/ML [place] SOLN Subcutaneous Inject 5 Units into the skin at bedtime.    . INSULIN ISOPHANE & REGULAR  (70-30) 100 UNIT/ML [place] SUSP Subcutaneous Inject 10 Units into the skin 2 (two) times daily with a meal.    . LABETALOL HCL 100 MG PO TABS Oral Take 100 mg by mouth 2 (two) times daily.    Marland Kitchen LOSARTAN POTASSIUM-HCTZ 50-12.5 MG PO TABS Oral Take 1 tablet by mouth daily.    Marland Kitchen METFORMIN HCL 500 MG PO TABS Oral Take 1,000 mg by mouth 2 (two) times daily with a meal.      BP 152/89  Pulse 81  Temp(Src) 98.2 F (36.8 C) (Oral)  Resp 11  SpO2 100%  Physical Exam CONSTITUTIONAL: Well developed/well nourished HEAD AND FACE: Normocephalic/atraumatic EYES: EOMI/PERRL ENMT: Mucous membranes moist, no visible signs of trauma NECK: supple no meningeal signs SPINE:entire spine nontender, No bruising/crepitance/stepoffs noted to spine CV: S1/S2 noted, no murmurs/rubs/gallops noted LUNGS: Lungs are clear to auscultation bilaterally, no apparent distress ABDOMEN: soft, nontender, no rebound or guarding GU:no cva tenderness NEURO: Pt is awake/alert, moves all extremitiesx4 No arm/leg drift.  Face is symmetric.   EXTREMITIES: pulses normal, full ROM, no tenderness, no deformity SKIN: warm, color normal PSYCH: no abnormalities of mood noted  ED Course  Procedures   Labs Reviewed  CBC  DIFFERENTIAL  BASIC METABOLIC PANEL  POCT I-STAT TROPONIN I   Dg Chest 2 View  08/19/2011  *RADIOLOGY REPORT*  Clinical Data: Shortness of breath and headache.  CHEST - 2 VIEW  Comparison: 03/18/2010  Findings: The lungs are clear without focal consolidation, edema, effusion or pneumothorax.  Cardiopericardial silhouette is within normal limits for size.  Imaged bony structures of the thorax are intact.  IMPRESSION: Normal exam.  Original Report Authenticated By: ERIC A. MANSELL, M.D.   9:52 AM Wife arrived, she reports he was lying on floor when she found him, he has some "twitching" of his left arm/leg and she had to "shake him" to arouse him, and he was confused afterwards.  She reports he has headaches for  "awhile"   Will obtain CT head Pt stable at this time, no distress Also, of note last echo in system reports EF at 60%  11:29 AM Initial workup negative but given syncopal event without prodrome will admit D/w dr Sloan Leiter, triad to admit      MDM  Nursing notes reviewed and considered in documentation xrays reviewed and considered All labs/vitals reviewed and considered        Date: 08/19/2011  Rate: 70  Rhythm: normal sinus rhythm  QRS Axis: normal  Intervals: normal  ST/T Wave abnormalities: nonspecific ST changes  Conduction Disutrbances:none  Narrative Interpretation:   Old EKG Reviewed: changes noted No STEMI noted, but does have some elevation in V3, but no reciprocal changes No CP at time of interpretation   Sharyon Cable, MD 08/19/11 1130

## 2011-08-19 NOTE — Progress Notes (Signed)
  Echocardiogram 2D Echocardiogram has been performed.  Loys Hoselton, Orlena Sheldon 08/19/2011, 6:38 PM

## 2011-08-19 NOTE — ED Notes (Signed)
Pt states last nite he just became sob all the sudden and then it resolved.  This morning he got hot and had syncopal event unwitnessed, but wife heard him fall.  She states he was out for 15 minutes.   Pt cannot remember event.  12 lead negative.  [id number].

## 2011-08-19 NOTE — H&P (Signed)
PATIENT DETAILS Name: Tyler Patterson Age: 61 y.o. Sex: male Date of Birth: 1950-10-02 Admit Date: 08/19/2011 PCP:Pcp Not In System   CHIEF COMPLAINT:  Loss of consciousness  HPI: Patient is a 61 year old black male with a past medical history of diabetes, hypertension, former heavy alcoholic who presents to the hospital today with the above-noted complaints. The patient woke up early this morning, walked up to the kitchen and passed out. He claims when he woke up he was feeling slightly dizzy. He denies any chest pain or shortness of breath. Per the history obtained by the emergency department physician, there was some questionable jerky movement of his right extremities. He apparently was confused for a few minutes, however during my evaluation he is completely awake and alert. His speech is clear. He claims that he used to drink heavily he has passed out a couple of times he denies any prior history of seizure or significant head injury. He  also denies any headache, neck pain, blurry vision, chest pain, abdominal pain, nausea, vomiting, diarrhea or dysuria.  ALLERGIES:   Allergies  Allergen Reactions  . Other     Unknown medication that he is allergic to. Starts with an F    PAST MEDICAL HISTORY: Past Medical History  Diagnosis Date  . Diabetes mellitus   . Hypertension     PAST SURGICAL HISTORY: History reviewed. No pertinent past surgical history.  MEDICATIONS AT HOME: Prior to Admission medications   Medication Sig Start Date End Date Taking? Authorizing Provider  insulin glargine (LANTUS) 100 UNIT/ML injection Inject 5 Units into the skin at bedtime.   Yes Historical Provider, MD  insulin NPH-insulin regular (NOVOLIN 70/30) (70-30) 100 UNIT/ML injection Inject 10 Units into the skin 2 (two) times daily with a meal.   Yes Historical Provider, MD  labetalol (NORMODYNE) 100 MG tablet Take 100 mg by mouth 2 (two) times daily.   Yes Historical Provider, MD    losartan-hydrochlorothiazide (HYZAAR) 50-12.5 MG per tablet Take 1 tablet by mouth daily.   Yes Historical Provider, MD  metFORMIN (GLUCOPHAGE) 500 MG tablet Take 1,000 mg by mouth 2 (two) times daily with a meal.   Yes Historical Provider, MD    FAMILY HISTORY: No family history on file.  SOCIAL HISTORY:  reports that he has never smoked. He does not have any smokeless tobacco history on file. He reports that he drinks alcohol. He reports that he does not use illicit drugs.  REVIEW OF SYSTEMS:  Constitutional:   No  weight loss, night sweats,  Fevers, chills, fatigue.  HEENT:    No headaches, Difficulty swallowing,Tooth/dental problems,Sore throat,  No sneezing, itching, ear ache, nasal congestion, post nasal drip,   Cardio-vascular: No chest pain,  Orthopnea, PND, swelling in lower extremities, anasarca, dizziness, palpitations  GI:  No heartburn, indigestion, abdominal pain, nausea, vomiting, diarrhea, change in  bowel habits, loss of appetite  Resp: No shortness of breath with exertion or at rest.  No excess mucus, no productive cough, No non-productive cough,  No coughing up of blood.No change in color of mucus.No wheezing.No chest wall deformity  Skin:  no rash or lesions.  GU:  no dysuria, change in color of urine, no urgency or frequency.  No flank pain.  Musculoskeletal: No joint pain or swelling.  No decreased range of motion.  No back pain.  Psych: No change in mood or affect. No depression or anxiety.  No memory loss.   PHYSICAL EXAM: Blood pressure 147/78, pulse 68, temperature  98.5 F (36.9 C), temperature source Oral, resp. rate 18, weight 80.4 kg (177 lb 4 oz), SpO2 100.00%.  General appearance :Awake, alert, not in any distress. Speech Clear. Not toxic Looking HEENT: Atraumatic and Normocephalic, pupils equally reactive to light and accomodation Neck: supple, no JVD. No cervical lymphadenopathy.  Chest:Good air entry bilaterally, no added sounds   CVS: S1 S2 regular, no murmurs.  Abdomen: Bowel sounds present, Non tender and not distended with no gaurding, rigidity or rebound. Extremities: B/L Lower Ext shows no edema, both legs are warm to touch, with  dorsalis pedis pulses palpable. Neurology: Awake alert, and oriented X 3, CN II-XII intact, Non focal, Deep Tendon Reflex-2+ all over, plantar's downgoing B/L, sensory exam is grossly intact.  Skin:No Rash Wounds:N/A  LABS ON ADMISSION:   Basename 08/19/11 0852  NA 133*  K 4.3  CL 93*  CO2 27  GLUCOSE 305*  BUN 17  CREATININE 0.98  CALCIUM 9.6  MG --  PHOS --   No results found for this basename: AST:2,ALT:2,ALKPHOS:2,BILITOT:2,PROT:2,ALBUMIN:2 in the last 72 hours No results found for this basename: LIPASE:2,AMYLASE:2 in the last 72 hours  Basename 08/19/11 1357 08/19/11 0852  WBC 4.6 3.6*  NEUTROABS -- 1.6*  HGB 14.1 14.0  HCT 39.9 40.2  MCV 91.1 91.8  PLT 231 249   No results found for this basename: CKTOTAL:3,CKMB:3,CKMBINDEX:3,TROPONINI:3 in the last 72 hours No results found for this basename: DDIMER:2 in the last 72 hours No components found with this basename: POCBNP:3   RADIOLOGIC STUDIES ON ADMISSION: Dg Chest 2 View  08/19/2011  *RADIOLOGY REPORT*  Clinical Data: Shortness of breath and headache.  CHEST - 2 VIEW  Comparison: 03/18/2010  Findings: The lungs are clear without focal consolidation, edema, effusion or pneumothorax.  Cardiopericardial silhouette is within normal limits for size.  Imaged bony structures of the thorax are intact.  IMPRESSION: Normal exam.  Original Report Authenticated By: ERIC A. MANSELL, M.D.   Ct Head Wo Contrast  08/19/2011  *RADIOLOGY REPORT*  Clinical Data: Unwitnessed syncopal episode.  CT HEAD WITHOUT CONTRAST  Technique:  Contiguous axial images were obtained from the base of the skull through the vertex without contrast.  Comparison: None.  Findings: There is no evidence of acute intracranial hemorrhage, mass lesion,  brain edema or extra-axial fluid collection.  The ventricles and subarachnoid spaces are appropriately sized for age. There is no CT evidence of acute cortical infarction.  The visualized paranasal sinuses are clear. The calvarium is intact.  IMPRESSION: No significant or reversible intracranial process.  Original Report Authenticated By: Vivia Ewing, M.D.    ASSESSMENT AND PLAN: Present on Admission:  .Syncope -Not sure whether this is secondary to orthostatics, or possibly a seizure episode-related to ?ETOH use -Patient will be admitted, he will be monitored in the telemetry unit.  -MRI of the brain, EEG and a 2-D echocardiogram will be obtained.  -Further plans will depend on these results.   .DM (diabetes mellitus) -We'll place him on his usual dosing of Lantus and sliding scale insulin. Hold metformin on admission and only resume on discharge.  Marland KitchenHTN (hypertension) -We'll resume both the labetolol and losartan/hydrochlorothiazide  History of EtOH use -Patient claims that he used to drink much more a few years ago, he currently drinks only the weekends. He claims that his last drink was Saturday, currently is awake and alert, and has no clinical signs of any alcohol withdrawal.  Further plan will depend as patient's clinical course evolves and further  radiologic and laboratory data become available. Patient will be monitored closely.   DVT Prophylaxis: Lovenox  Code Status: Full code  Total time spent for admission equals 45 minutes.  Oren Binet 08/19/2011, 2:20 PM

## 2011-08-19 NOTE — Progress Notes (Signed)
Utilization review completed.  

## 2011-08-20 ENCOUNTER — Observation Stay (HOSPITAL_COMMUNITY): Payer: Self-pay

## 2011-08-20 DIAGNOSIS — I10 Essential (primary) hypertension: Secondary | ICD-10-CM

## 2011-08-20 DIAGNOSIS — R55 Syncope and collapse: Secondary | ICD-10-CM

## 2011-08-20 DIAGNOSIS — E1165 Type 2 diabetes mellitus with hyperglycemia: Secondary | ICD-10-CM

## 2011-08-20 LAB — COMPREHENSIVE METABOLIC PANEL
ALT: 11 U/L (ref 0–53)
Alkaline Phosphatase: 66 U/L (ref 39–117)
CO2: 25 mEq/L (ref 19–32)
Calcium: 9.9 mg/dL (ref 8.4–10.5)
Chloride: 89 mEq/L — ABNORMAL LOW (ref 96–112)
GFR calc Af Amer: >90 mL/min (ref >90)
GFR calc non Af Amer: >90 mL/min (ref >90)
Glucose, Bld: 296 mg/dL — ABNORMAL HIGH (ref 70–99)
Potassium: 3.4 mEq/L — ABNORMAL LOW (ref 3.5–5.1)
Sodium: 131 mEq/L — ABNORMAL LOW (ref 135–145)
Total Bilirubin: 0.6 mg/dL (ref 0.3–1.2)

## 2011-08-20 LAB — CBC
MCH: 32.1 pg (ref 26.0–34.0)
MCHC: 34.4 g/dL (ref 30.0–36.0)
Platelets: 242 10*3/uL (ref 150–400)
RDW: 13.4 % (ref 11.5–15.5)

## 2011-08-20 LAB — PROTIME-INR
INR: 0.9 (ref 0.00–1.49)
Prothrombin Time: 12.3 seconds (ref 11.6–15.2)

## 2011-08-20 LAB — GLUCOSE, CAPILLARY
Glucose-Capillary: 267 mg/dL — ABNORMAL HIGH (ref 70–99)
Glucose-Capillary: 438 mg/dL — ABNORMAL HIGH (ref 70–99)
Glucose-Capillary: 193 mg/dL — ABNORMAL HIGH (ref 70–99)

## 2011-08-20 LAB — CARDIAC PANEL(CRET KIN+CKTOT+MB+TROPI): Relative Index: INVALID (ref 0.0–2.5)

## 2011-08-20 MED ORDER — INSULIN ASPART 100 UNIT/ML ~~LOC~~ SOLN
0.0000 [IU] | Freq: Three times a day (TID) | SUBCUTANEOUS | Status: DC
  Administered 2011-08-20: 3 [IU] via SUBCUTANEOUS
  Administered 2011-08-21: 5 [IU] via SUBCUTANEOUS
  Administered 2011-08-21: 8 [IU] via SUBCUTANEOUS

## 2011-08-20 MED ORDER — POTASSIUM CHLORIDE CRYS ER 20 MEQ PO TBCR
40.0000 meq | EXTENDED_RELEASE_TABLET | Freq: Every day | ORAL | Status: DC
  Administered 2011-08-20 – 2011-08-21 (×2): 40 meq via ORAL
  Filled 2011-08-20 (×2): qty 2

## 2011-08-20 MED ORDER — INSULIN ASPART 100 UNIT/ML ~~LOC~~ SOLN: 0.0000 [IU] | Freq: Every day | SUBCUTANEOUS | Status: DC

## 2011-08-20 MED ORDER — INSULIN GLARGINE 100 UNIT/ML ~~LOC~~ SOLN
10.0000 [IU] | Freq: Every day | SUBCUTANEOUS | Status: DC
  Administered 2011-08-20: 10 [IU] via SUBCUTANEOUS

## 2011-08-20 MED ORDER — INSULIN ASPART 100 UNIT/ML ~~LOC~~ SOLN
15.0000 [IU] | Freq: Once | SUBCUTANEOUS | Status: AC
  Administered 2011-08-20: 15 [IU] via SUBCUTANEOUS

## 2011-08-20 MED ORDER — INSULIN NPH (HUMAN) (ISOPHANE) 100 UNIT/ML ~~LOC~~ SUSP
15.0000 [IU] | Freq: Once | SUBCUTANEOUS | Status: AC
  Administered 2011-08-20: 15 [IU] via SUBCUTANEOUS
  Filled 2011-08-20: qty 10

## 2011-08-20 NOTE — Progress Notes (Signed)
Inpatient Diabetes Program Recommendations  AACE/ADA: New Consensus Statement on Inpatient Glycemic Control (2009)  Target Ranges:  Prepandial:   less than 140 mg/dL      Peak postprandial:   less than 180 mg/dL (1-2 hours)      Critically ill patients:  140 - 180 mg/dL   Reason for Visit: Hyperglycemia in 300's and 400's Patient may benefit from IV insulin drip at this point, otherwise:  Inpatient Diabetes Program Recommendations Insulin - Meal Coverage: Please add 4 units tidwc HgbA1C: Please check current value.  Last value at 11.9% on 02/2010 Diet: Added carb modified med to diet orders, as heart does not have any carbohydrate restriction.  Note: Thank you, Rosita Kea, RN, CNS, Diabetes Coordinator [phone])

## 2011-08-20 NOTE — Progress Notes (Signed)
Call received from Marcelino Scot with [facility]- pt does have orange card (to be recertified) with [facility] and has upcoming appointment with [facility] MD in June. CM with [facility] to follow pt as outpt.

## 2011-08-20 NOTE — Procedures (Signed)
EEG NUMBER:  REFERRING PHYSICIAN:  Oren Binet, MD  HISTORY:  A 61 year old male with episode of loss of consciousness.  MEDICATIONS:  Aspirin, Lovenox, Folvite, Microzide, NovoLog, Lantus, Normodyne, Cozaar.  CONDITIONS OF RECORDING:  This is a 16 channel EEG carried out with the patient in the awake and drowsy states.  DESCRIPTION:  The waking background activity consists of a low-voltage, symmetrical, fairly well-organized, 9 Hz alpha activity seen from the parieto-occipital and posterotemporal regions.  Low-voltage, fast activity, poorly organized was seen anteriorly at times, superimposed on more posterior rhythms.  A mixture of theta and alpha was seen from the central and temporal regions.  The patient drowses briefly with slowing to low voltage theta and beta activity.  Stage II sleep was not obtained.  Hypoventilation was performed and produced a moderate buildup, but failed to elicit any abnormalities.  Intermittent photic stimulation was performed, but failed to elicit any change in the tracing.  IMPRESSION:  This is a normal EEG.          ______________________________ Alexis Goodell, MD    [id number] D:  08/20/2011 18:45:14  T:  08/20/2011 [id number]  Job #:  [id number]

## 2011-08-20 NOTE — Care Management Note (Signed)
  Page 1 of 1   08/20/2011     2:51:50 PM   CARE MANAGEMENT NOTE 08/20/2011  Patient:  Tyler Patterson, Tyler Patterson   Account Number:  [id number]  Date Initiated:  08/20/2011  Documentation initiated by:  Marvetta Gibbons  Subjective/Objective Assessment:   Pt admwitted with syncope     Action/Plan:   PTA pt lived at home, independent with ADLs   Anticipated DC Date:  08/21/2011   Anticipated DC Plan:  [place]  CM consult  Medication Assistance      Choice offered to / List presented to:             Status of service:  In process, will continue to follow Medicare Important Message given?   (If response is "NO", the following Medicare IM given date fields will be blank) Date Medicare IM given:   Date Additional Medicare IM given:    Discharge Disposition:    Per UR Regulation:  Reviewed for med. necessity/level of care/duration of stay  If discussed at [place] of Stay Meetings, dates discussed:    Comments:  PCP- [facility]-  08/20/11- 1445- Marvetta Gibbons RN, BSN [phone] Spoke with pt at bedside- per conversation pt did confirm that he is followed by [facility] services- pt states that his eligibity appointment for his orange card is next wed, and his upcoming MD appointment is for June 10. Pt states that he should have transportation home. Pt does express that he needs help with medications until he is seen by [facility]- checked with pharmacy and pt is eligible for assistance with indigent fund-  **MD please write for both 3 day and 30 day on any po medications at discharge. NCM will follow for d/c needs.

## 2011-08-20 NOTE — Progress Notes (Signed)
Subjective: No new issues.  Objective: Vital signs in last 24 hours: Temp:  [97.4 F (36.3 C)-98.5 F (36.9 C)] 98.1 F (36.7 C) (05/23 1146) Pulse Rate:  [63-84] 78  (05/23 1146) Resp:  [16-20] 16  (05/23 1146) BP: (104-148)/(67-82) 124/72 mmHg (05/23 1146) SpO2:  [99 %-100 %] 100 % (05/23 1146) Weight:  [80.287 kg (177 lb)] 80.287 kg (177 lb) (05/23 0511) Weight change:  Last BM Date: 08/20/11  Intake/Output from previous day: 05/22 0701 - 05/23 0700 In: 360 [P.O.:360] Out: -      Physical Exam: General: Comfortable, alert, communicative, fully oriented, not short of breath at rest.  HEENT:  No clinical pallor, no jaundice, no conjunctival injection or discharge. Hydration status is fair. NECK:  Supple, JVP not seen, no carotid bruits, no palpable lymphadenopathy, no palpable goiter. CHEST:  Clinically clear to auscultation, no wheezes, no crackles. HEART:  Sounds 1 and 2 heard, normal, regular, no murmurs. ABDOMEN:  Full, soft, non-tender, no palpable organomegaly, no palpable masses, normal bowel sounds. GENITALIA:  Not examined. LOWER EXTREMITIES:  No pitting edema, palpable peripheral pulses. MUSCULOSKELETAL SYSTEM:  Unremarkable. CENTRAL NERVOUS SYSTEM:  No focal neurologic deficit on gross examination.  Lab Results:  Basename 08/20/11 0608 08/19/11 1357  WBC 4.4 4.6  HGB 14.2 14.1  HCT 41.3 39.9  PLT 242 231    Basename 08/20/11 0608 08/19/11 1357 08/19/11 0852  NA 131* -- 133*  K 3.4* -- 4.3  CL 89* -- 93*  CO2 25 -- 27  GLUCOSE 296* -- 305*  BUN 13 -- 17  CREATININE 0.90 0.97 --  CALCIUM 9.9 -- 9.6   No results found for this or any previous visit (from the past 240 hour(s)).   Studies/Results: Dg Chest 2 View  08/19/2011  *RADIOLOGY REPORT*  Clinical Data: Shortness of breath and headache.  CHEST - 2 VIEW  Comparison: 03/18/2010  Findings: The lungs are clear without focal consolidation, edema, effusion or pneumothorax.  Cardiopericardial  silhouette is within normal limits for size.  Imaged bony structures of the thorax are intact.  IMPRESSION: Normal exam.  Original Report Authenticated By: ERIC A. MANSELL, M.D.   Ct Head Wo Contrast  08/19/2011  *RADIOLOGY REPORT*  Clinical Data: Unwitnessed syncopal episode.  CT HEAD WITHOUT CONTRAST  Technique:  Contiguous axial images were obtained from the base of the skull through the vertex without contrast.  Comparison: None.  Findings: There is no evidence of acute intracranial hemorrhage, mass lesion, brain edema or extra-axial fluid collection.  The ventricles and subarachnoid spaces are appropriately sized for age. There is no CT evidence of acute cortical infarction.  The visualized paranasal sinuses are clear. The calvarium is intact.  IMPRESSION: No significant or reversible intracranial process.  Original Report Authenticated By: Vivia Ewing, M.D.   Mr Brain Wo Contrast  08/20/2011  *RADIOLOGY REPORT*  Clinical Data: Dizziness.  Passing out.  High blood pressure. Diabetes.  MRI HEAD WITHOUT CONTRAST  Technique:  Multiplanar, multiecho pulse sequences of the brain and surrounding structures were obtained according to standard protocol without intravenous contrast.  Comparison: 08/19/2011.  Findings: No acute infarct.  No intracranial hemorrhage.  Prominent white matter type changes most notable periventricular region suggestive of result of small vessel disease in this diabetic hypertensive patient.  Global mild atrophy without hydrocephalus.  Major intracranial vascular structures are patent.  No intracranial mass lesion detected on this unenhanced exam.  Opacification left maxillary sinus with central inspissated material.  This demonstrates restricted motion  suggesting this may represent acute on top of chronic inflammation.  Cervical medullary junction, pituitary region and pineal region unremarkable.  Minimal exophthalmos.  IMPRESSION: No acute infarct.  Prominent white matter type  changes most notable periventricular region suggestive of result of small vessel disease in this diabetic hypertensive patient.  Opacification left maxillary sinus with central inspissated material.  This demonstrates restricted motion suggesting this may represent acute on top of chronic inflammation.  Original Report Authenticated By: Doug Sou, M.D.   Portable Chest 1 View  08/19/2011  *RADIOLOGY REPORT*  Clinical Data: Shortness of breath and weakness.  PORTABLE CHEST - 1 VIEW  Comparison: 08/19/2011  Findings: 1423 hours. The lungs are clear without focal infiltrate, edema, pneumothorax or pleural effusion. The cardiopericardial silhouette is within normal limits for size. Imaged bony structures of the thorax are intact. Telemetry leads overlie the chest.  IMPRESSION: Low volume film without acute cardiopulmonary process.  Original Report Authenticated By: ERIC A. MANSELL, M.D.    Medications: Scheduled Meds:   . aspirin EC  81 mg Oral Daily  . enoxaparin  40 mg Subcutaneous Q24H  . folic acid  1 mg Oral Daily  . hydrochlorothiazide  12.5 mg Oral Daily  . insulin aspart  0-15 Units Subcutaneous TID WC  . insulin aspart  0-5 Units Subcutaneous QHS  . insulin aspart  0-9 Units Subcutaneous Once  . insulin aspart  15 Units Subcutaneous Once  . insulin glargine  10 Units Subcutaneous QHS  . insulin NPH  15 Units Subcutaneous Once  . labetalol  100 mg Oral BID  . losartan  50 mg Oral Daily  . mulitivitamin with minerals  1 tablet Oral Daily  . pneumococcal 23 valent vaccine  0.5 mL Intramuscular Once  . senna  1 tablet Oral BID  . sodium chloride  3 mL Intravenous Q12H  . sodium chloride  3 mL Intravenous Q12H  . thiamine  100 mg Oral Daily  . DISCONTD: insulin aspart  0-9 Units Subcutaneous TID WC  . DISCONTD: insulin glargine  5 Units Subcutaneous QHS  . DISCONTD: losartan-hydrochlorothiazide  1 tablet Oral Daily   Continuous Infusions:  PRN Meds:.sodium chloride,  acetaminophen, acetaminophen, alum & mag hydroxide-simeth, HYDROcodone-acetaminophen, ondansetron (ZOFRAN) IV, ondansetron, sodium chloride  Assessment/Plan: Active Problems. 1. Syncope: Patient presented following a syncopal episode. Circumstances are unclear, as there is no observer account, but patient had no tongue-biting or incontinence. He has had syncopal episodes in the past, when according to him, he was a "heavy " drinker. He now claims to have markedly reduced, but drinks about a 12-pack of beer on weekends. Last ETOH intake was on 08/15/11. Perhaps this was a vaso-vagal episode, or indeed a seizure episode. Brain MRI is devoid of acute abnormalities, 2D echocardiogram showed normal left ventricular cavity size was normal, ejection fraction was 60% and there were no regional wall motion abnormalities. Awaiting Carotid/Vertebral artery duplex, and EEG. No arrhythmias have been recorded on telemetry. 2. DM (diabetes mellitus): This is uncontrolled. We are adjusting SSI/Lantus accordingly. Checking HBA1C. Patient apparently follows up at [facility]. Metformin will be restarted.  3. HTN (hypertension): This is controlled on pre-admission Labetolol and Losartan/Hydrochlorothiazide.  4. ETOH abuse: Patient still drinks rather heavily, about a 12-pack of beer on weekends. Last ETOH intake was on 08/15/11. He has shown no evidence of alcohol withdrawal phenomena, during this hospitalization.  Comment: Possible discharge on 08/21/11.    LOS: 1 day   Rhapsody Wolven,CHRISTOPHER 08/20/2011, 1:22 PM

## 2011-08-20 NOTE — Progress Notes (Signed)
CSW received referral that patient has trouble paying for medications. CSW will inform rn case Freight forwarder. No further csw needs at this time, sign off.   Marland KitchenDorathy Kinsman, [place] .08/20/2011 13:27pm

## 2011-08-20 NOTE — Progress Notes (Signed)
Pt CBG elevated at 327. NP on call made aware. No new orders received will cont to monitor pt.

## 2011-08-20 NOTE — Progress Notes (Signed)
Utilization review completed.  

## 2011-08-21 DIAGNOSIS — E1165 Type 2 diabetes mellitus with hyperglycemia: Secondary | ICD-10-CM

## 2011-08-21 DIAGNOSIS — R55 Syncope and collapse: Secondary | ICD-10-CM

## 2011-08-21 DIAGNOSIS — I10 Essential (primary) hypertension: Secondary | ICD-10-CM

## 2011-08-21 LAB — BASIC METABOLIC PANEL
BUN: 15 mg/dL (ref 6–23)
CO2: 27 mEq/L (ref 19–32)
Calcium: 9.8 mg/dL (ref 8.4–10.5)
Chloride: 93 mEq/L — ABNORMAL LOW (ref 96–112)
Creatinine, Ser: 1.01 mg/dL (ref 0.50–1.35)

## 2011-08-21 LAB — GLUCOSE, CAPILLARY
Glucose-Capillary: 219 mg/dL — ABNORMAL HIGH (ref 70–99)
Glucose-Capillary: 255 mg/dL — ABNORMAL HIGH (ref 70–99)

## 2011-08-21 LAB — HEMOGLOBIN A1C: Hgb A1c MFr Bld: 11.1 % — ABNORMAL HIGH (ref <5.7)

## 2011-08-21 MED ORDER — FOLIC ACID 1 MG PO TABS: 1.0000 mg | ORAL_TABLET | Freq: Every day | ORAL | Status: AC

## 2011-08-21 MED ORDER — INSULIN GLARGINE 100 UNIT/ML ~~LOC~~ SOLN: 10.0000 [IU] | Freq: Every day | SUBCUTANEOUS | Status: DC

## 2011-08-21 MED ORDER — THIAMINE HCL 100 MG PO TABS: 100.0000 mg | ORAL_TABLET | Freq: Every day | ORAL | Status: AC

## 2011-08-21 MED ORDER — INSULIN GLARGINE 100 UNIT/ML ~~LOC~~ SOLN
10.0000 [IU] | Freq: Two times a day (BID) | SUBCUTANEOUS | Status: DC
  Administered 2011-08-21: 10 [IU] via SUBCUTANEOUS

## 2011-08-21 MED ORDER — INSULIN ASPART 100 UNIT/ML ~~LOC~~ SOLN
4.0000 [IU] | Freq: Three times a day (TID) | SUBCUTANEOUS | Status: DC
  Administered 2011-08-21: 4 [IU] via SUBCUTANEOUS

## 2011-08-21 MED ORDER — ASPIRIN 81 MG PO TBEC: 81.0000 mg | DELAYED_RELEASE_TABLET | Freq: Every day | ORAL | Status: AC

## 2011-08-21 NOTE — Progress Notes (Signed)
Pt received discharge instructions with no further questions. Stable for DC. Will follow up with [facility] - has appointment on June 10. Knows when to call the doctor/911. IV DC'd. Transported by volunteer via wheelchair. Friend to drive home. Lillia Mountain, RN

## 2011-08-21 NOTE — Discharge Summary (Signed)
Physician Discharge Summary  Patient ID: Tyler Patterson MRN: [id number] DOB/AGE: [birth year] 61 y.o.  Admit date: 08/19/2011 Discharge date: 08/21/2011  Primary Care Physician:  Pcp Not In System [facility]. Discharge Diagnoses:    Patient Active Problem List  Diagnoses  . Syncope  . DM (diabetes mellitus)  . HTN (hypertension)    Medication List  As of 08/21/2011 12:39 PM   STOP taking these medications         insulin NPH-insulin regular (70-30) 100 UNIT/ML injection         TAKE these medications         aspirin 81 MG EC tablet   Take 1 tablet (81 mg total) by mouth daily.      folic acid 1 MG tablet   Commonly known as: FOLVITE   Take 1 tablet (1 mg total) by mouth daily.      insulin glargine 100 UNIT/ML injection   Commonly known as: LANTUS   Inject 10 Units into the skin at bedtime.      labetalol 100 MG tablet   Commonly known as: NORMODYNE   Take 100 mg by mouth 2 (two) times daily.      losartan-hydrochlorothiazide 50-12.5 MG per tablet   Commonly known as: HYZAAR   Take 1 tablet by mouth daily.      metFORMIN 500 MG tablet   Commonly known as: GLUCOPHAGE   Take 1,000 mg by mouth 2 (two) times daily with a meal.      thiamine 100 MG tablet   Take 1 tablet (100 mg total) by mouth daily.             Disposition and Follow-up:  Follow up with PMD.  Consults:  None.   Significant Diagnostic Studies:  Dg Chest 2 View  08/19/2011  *RADIOLOGY REPORT*  Clinical Data: Shortness of breath and headache.  CHEST - 2 VIEW  Comparison: 03/18/2010  Findings: The lungs are clear without focal consolidation, edema, effusion or pneumothorax.  Cardiopericardial silhouette is within normal limits for size.  Imaged bony structures of the thorax are intact.  IMPRESSION: Normal exam.  Original Report Authenticated By: ERIC A. MANSELL, M.D.   Ct Head Wo Contrast  08/19/2011  *RADIOLOGY REPORT*  Clinical Data: Unwitnessed syncopal episode.  CT HEAD WITHOUT CONTRAST   Technique:  Contiguous axial images were obtained from the base of the skull through the vertex without contrast.  Comparison: None.  Findings: There is no evidence of acute intracranial hemorrhage, mass lesion, brain edema or extra-axial fluid collection.  The ventricles and subarachnoid spaces are appropriately sized for age. There is no CT evidence of acute cortical infarction.  The visualized paranasal sinuses are clear. The calvarium is intact.  IMPRESSION: No significant or reversible intracranial process.  Original Report Authenticated By: Vivia Ewing, M.D.   Mr Brain Wo Contrast  08/20/2011  *RADIOLOGY REPORT*  Clinical Data: Dizziness.  Passing out.  High blood pressure. Diabetes.  MRI HEAD WITHOUT CONTRAST  Technique:  Multiplanar, multiecho pulse sequences of the brain and surrounding structures were obtained according to standard protocol without intravenous contrast.  Comparison: 08/19/2011.  Findings: No acute infarct.  No intracranial hemorrhage.  Prominent white matter type changes most notable periventricular region suggestive of result of small vessel disease in this diabetic hypertensive patient.  Global mild atrophy without hydrocephalus.  Major intracranial vascular structures are patent.  No intracranial mass lesion detected on this unenhanced exam.  Opacification left maxillary sinus with central inspissated material.  This demonstrates restricted motion suggesting this may represent acute on top of chronic inflammation.  Cervical medullary junction, pituitary region and pineal region unremarkable.  Minimal exophthalmos.  IMPRESSION: No acute infarct.  Prominent white matter type changes most notable periventricular region suggestive of result of small vessel disease in this diabetic hypertensive patient.  Opacification left maxillary sinus with central inspissated material.  This demonstrates restricted motion suggesting this may represent acute on top of chronic inflammation.   Original Report Authenticated By: Doug Sou, M.D.   Portable Chest 1 View  08/19/2011  *RADIOLOGY REPORT*  Clinical Data: Shortness of breath and weakness.  PORTABLE CHEST - 1 VIEW  Comparison: 08/19/2011  Findings: 1423 hours. The lungs are clear without focal infiltrate, edema, pneumothorax or pleural effusion. The cardiopericardial silhouette is within normal limits for size. Imaged bony structures of the thorax are intact. Telemetry leads overlie the chest.  IMPRESSION: Low volume film without acute cardiopulmonary process.  Original Report Authenticated By: ERIC A. MANSELL, M.D.    Brief H and P: For complete details, refer to admission H and P. However, in brief, this is a 61 year old male,  with history of diabetes, hypertension, alcohol abuse, presenting following a syncopal episode, which occurred after woke up early in the morning, walked up to the kitchen and passed out. He claims when he woke up he was feeling slightly dizzy. Symptoms had resulved at the time of evaluation, by Hospitalist. He was admitted for further evaluation, investigation and management.  Physical Exam: On 08/21/11. General: Comfortable, alert, communicative, fully oriented, not short of breath at rest.  HEENT: No clinical pallor, no jaundice, no conjunctival injection or discharge. Hydration status is fair.  NECK: Supple, JVP not seen, no carotid bruits, no palpable lymphadenopathy, no palpable goiter.  CHEST: Clinically clear to auscultation, no wheezes, no crackles.  HEART: Sounds 1 and 2 heard, normal, regular, no murmurs.  ABDOMEN: Full, soft, non-tender, no palpable organomegaly, no palpable masses, normal bowel sounds.  GENITALIA: Not examined.  LOWER EXTREMITIES: No pitting edema, palpable peripheral pulses.  MUSCULOSKELETAL SYSTEM: Unremarkable.  CENTRAL NERVOUS SYSTEM: No focal neurologic deficit on gross examination.  Hospital Course:  Active Problems.  1. Syncope: Patient presented following a  syncopal episode. Circumstances are unclear, as there is no observer account, but patient had no tongue-biting or incontinence. He has had syncopal episodes in the past, when according to him, he was a "heavy " drinker. He now claims to have markedly reduced, but drinks about a 12-pack of beer on weekends. Last ETOH intake was on 08/15/11. Perhaps this was a vaso-vagal episode, or indeed a seizure episode. Brain MRI is devoid of acute abnormalities, 2D echocardiogram showed normal left ventricular cavity size was normal, ejection fraction was 60% and there were no regional wall motion abnormalities. Carotid/Vertebral artery duplex showed no evidence of significant ICA stenosis and EEG was normal. No arrhythmias have been recorded on telemetry. 2. DM (diabetes mellitus): This was uncontrolled at presentation, with CBGs, up into the 400s, on 08/20/11. We are adjusted SSI/Lantus accordingly, and as of 08/21/11, CBS were better controlled, in the low 200s. HBA1C was 11.1. Patient apparently follows up at [facility]. Metformin was continued.  3. HTN (hypertension): This is controlled on pre-admission Labetolol and Losartan/Hydrochlorothiazide.  4. ETOH abuse: Patient still drinks rather heavily, about a 12-pack of beer on weekends. Last ETOH intake was on 08/15/11. He has shown no evidence of alcohol withdrawal phenomena, during this hospitalization.   Comment: Stable for discharge  on 08/21/11.   Time spent on Discharge: 40 mins.  Signed: Jessi Jessop,CHRISTOPHER 08/21/2011, 12:39 PM

## 2011-08-21 NOTE — Consult Note (Signed)
Pt says he smokes 3 black and mild cigars a day and they are wine flavored. He states that he knows he has to quit but not certain when he is ready to do this. Pt in contemplation stage. He voices understanding of risk factors. Referred to 1-800 quit now for f/u and support. Discussed oral fixation substitutes, second hand smoke and in home smoking policy. Reviewed and gave pt Written education/contact information.

## 2011-08-21 NOTE — Progress Notes (Signed)
Bilateral carotid artery duplex completed.  Preliminary report is no evidence of significant ICA stenosis.

## 2013-05-13 ENCOUNTER — Inpatient Hospital Stay (HOSPITAL_COMMUNITY)
Admission: EM | Admit: 2013-05-13 | Discharge: 2013-05-15 | DRG: 312 | Disposition: A | Discharge status: Home / Self Care | Payer: Medicaid Other | Attending: Internal Medicine | Admitting: Internal Medicine

## 2013-05-13 ENCOUNTER — Emergency Department (HOSPITAL_COMMUNITY): Payer: Medicaid Other

## 2013-05-13 ENCOUNTER — Encounter (HOSPITAL_COMMUNITY): Payer: Self-pay | Admitting: Emergency Medicine

## 2013-05-13 DIAGNOSIS — R531 Weakness: Secondary | ICD-10-CM

## 2013-05-13 DIAGNOSIS — R519 Headache, unspecified: Secondary | ICD-10-CM

## 2013-05-13 DIAGNOSIS — Z794 Long term (current) use of insulin: Secondary | ICD-10-CM

## 2013-05-13 DIAGNOSIS — R55 Syncope and collapse: Principal | ICD-10-CM | POA: Diagnosis present

## 2013-05-13 DIAGNOSIS — R51 Headache: Secondary | ICD-10-CM

## 2013-05-13 DIAGNOSIS — K219 Gastro-esophageal reflux disease without esophagitis: Secondary | ICD-10-CM | POA: Diagnosis present

## 2013-05-13 DIAGNOSIS — I10 Essential (primary) hypertension: Secondary | ICD-10-CM

## 2013-05-13 DIAGNOSIS — M6281 Muscle weakness (generalized): Secondary | ICD-10-CM

## 2013-05-13 DIAGNOSIS — E119 Type 2 diabetes mellitus without complications: Secondary | ICD-10-CM | POA: Diagnosis present

## 2013-05-13 DIAGNOSIS — Z79899 Other long term (current) drug therapy: Secondary | ICD-10-CM

## 2013-05-13 DIAGNOSIS — F17200 Nicotine dependence, unspecified, uncomplicated: Secondary | ICD-10-CM | POA: Diagnosis present

## 2013-05-13 DIAGNOSIS — W19XXXA Unspecified fall, initial encounter: Secondary | ICD-10-CM

## 2013-05-13 DIAGNOSIS — F1010 Alcohol abuse, uncomplicated: Secondary | ICD-10-CM | POA: Diagnosis present

## 2013-05-13 DIAGNOSIS — M542 Cervicalgia: Secondary | ICD-10-CM

## 2013-05-13 HISTORY — DX: Headache: R51

## 2013-05-13 HISTORY — DX: Headache, unspecified: R51.9

## 2013-05-13 LAB — RAPID URINE DRUG SCREEN, HOSP PERFORMED
Amphetamines: NOT DETECTED
Barbiturates: NOT DETECTED
Benzodiazepines: NOT DETECTED
Cocaine: NOT DETECTED
Opiates: NOT DETECTED
Tetrahydrocannabinol: NOT DETECTED

## 2013-05-13 LAB — POCT I-STAT, CHEM 8
BUN: 13 mg/dL (ref 6–23)
Calcium, Ion: 1.18 mmol/L (ref 1.13–1.30)
Chloride: 98 mEq/L (ref 96–112)
Creatinine, Ser: 1 mg/dL (ref 0.50–1.35)
Glucose, Bld: 302 mg/dL — ABNORMAL HIGH (ref 70–99)
HCT: 43 % (ref 39.0–52.0)
Hemoglobin: 14.6 g/dL (ref 13.0–17.0)
Potassium: 4.2 mEq/L (ref 3.7–5.3)
SODIUM: 135 meq/L — AB (ref 137–147)
TCO2: 26 mmol/L (ref 0–100)

## 2013-05-13 LAB — CBC
HEMATOCRIT: 38.4 % — AB (ref 39.0–52.0)
HEMATOCRIT: 39.3 % (ref 39.0–52.0)
HEMOGLOBIN: 13.6 g/dL (ref 13.0–17.0)
HEMOGLOBIN: 14.4 g/dL (ref 13.0–17.0)
MCH: 33.7 pg (ref 26.0–34.0)
MCH: 34.4 pg — ABNORMAL HIGH (ref 26.0–34.0)
MCHC: 35.4 g/dL (ref 30.0–36.0)
MCHC: 36.6 g/dL — ABNORMAL HIGH (ref 30.0–36.0)
MCV: 95 fL (ref 78.0–100.0)
MCV: 94 fL (ref 78.0–100.0)
Platelets: 178 10*3/uL (ref 150–400)
Platelets: 186 10*3/uL (ref 150–400)
RBC: 4.04 MIL/uL — AB (ref 4.22–5.81)
RBC: 4.18 MIL/uL — ABNORMAL LOW (ref 4.22–5.81)
RDW: 12.9 % (ref 11.5–15.5)
RDW: 12.8 % (ref 11.5–15.5)
WBC: 3.5 10*3/uL — ABNORMAL LOW (ref 4.0–10.5)
WBC: 4.8 10*3/uL (ref 4.0–10.5)

## 2013-05-13 LAB — COMPREHENSIVE METABOLIC PANEL
ALK PHOS: 84 U/L (ref 39–117)
ALT: 12 U/L (ref 0–53)
AST: 15 U/L (ref 0–37)
Albumin: 3.4 g/dL — ABNORMAL LOW (ref 3.5–5.2)
BILIRUBIN TOTAL: 0.7 mg/dL (ref 0.3–1.2)
BUN: 13 mg/dL (ref 6–23)
CHLORIDE: 97 meq/L (ref 96–112)
CO2: 25 mEq/L (ref 19–32)
Calcium: 8.9 mg/dL (ref 8.4–10.5)
Creatinine, Ser: 0.89 mg/dL (ref 0.50–1.35)
GFR, EST NON AFRICAN AMERICAN: 90 mL/min — AB (ref >90)
Glucose, Bld: 298 mg/dL — ABNORMAL HIGH (ref 70–99)
POTASSIUM: 4.3 meq/L (ref 3.7–5.3)
Sodium: 135 mEq/L — ABNORMAL LOW (ref 137–147)
Total Protein: 7.1 g/dL (ref 6.0–8.3)

## 2013-05-13 LAB — ETHANOL: Alcohol, Ethyl (B): <11 mg/dL (ref 0–11)

## 2013-05-13 LAB — DIFFERENTIAL
Basophils Absolute: 0 10*3/uL (ref 0.0–0.1)
Basophils Relative: 0 % (ref 0–1)
Eosinophils Absolute: 0.1 10*3/uL (ref 0.0–0.7)
Eosinophils Relative: 2 % (ref 0–5)
LYMPHS ABS: 1.4 10*3/uL (ref 0.7–4.0)
Lymphocytes Relative: 39 % (ref 12–46)
Monocytes Absolute: 0.4 10*3/uL (ref 0.1–1.0)
Monocytes Relative: 11 % (ref 3–12)
NEUTROS PCT: 48 % (ref 43–77)
Neutro Abs: 1.7 10*3/uL (ref 1.7–7.7)

## 2013-05-13 LAB — GLUCOSE, CAPILLARY
GLUCOSE-CAPILLARY: 52 mg/dL — AB (ref 70–99)
Glucose-Capillary: 353 mg/dL — ABNORMAL HIGH (ref 70–99)
Glucose-Capillary: 81 mg/dL (ref 70–99)

## 2013-05-13 LAB — URINALYSIS, ROUTINE W REFLEX MICROSCOPIC
Bilirubin Urine: NEGATIVE
Ketones, ur: NEGATIVE mg/dL
LEUKOCYTES UA: NEGATIVE
Nitrite: NEGATIVE
PH: 7 (ref 5.0–8.0)
Protein, ur: NEGATIVE mg/dL
SPECIFIC GRAVITY, URINE: 1.016 (ref 1.005–1.030)
Urobilinogen, UA: 0.2 mg/dL (ref 0.0–1.0)

## 2013-05-13 LAB — HEMOGLOBIN A1C
HEMOGLOBIN A1C: 9.4 % — AB (ref <5.7)
Mean Plasma Glucose: 223 mg/dL — ABNORMAL HIGH (ref <117)

## 2013-05-13 LAB — URINE MICROSCOPIC-ADD ON

## 2013-05-13 LAB — PROTIME-INR
INR: 0.91 (ref 0.00–1.49)
Prothrombin Time: 12.1 seconds (ref 11.6–15.2)

## 2013-05-13 LAB — TSH: TSH: 1.002 u[IU]/mL (ref 0.350–4.500)

## 2013-05-13 LAB — POCT I-STAT TROPONIN I: TROPONIN I, POC: 0.01 ng/mL (ref 0.00–0.08)

## 2013-05-13 LAB — CREATININE, SERUM
CREATININE: 0.8 mg/dL (ref 0.50–1.35)
GFR calc Af Amer: >90 mL/min (ref >90)

## 2013-05-13 LAB — APTT: APTT: 27 s (ref 24–37)

## 2013-05-13 LAB — MAGNESIUM: Magnesium: 1.6 mg/dL (ref 1.5–2.5)

## 2013-05-13 LAB — TROPONIN I

## 2013-05-13 MED ORDER — MORPHINE SULFATE 2 MG/ML IJ SOLN: 2.0000 mg | INTRAMUSCULAR | Status: DC | PRN

## 2013-05-13 MED ORDER — MAGNESIUM SULFATE 50 % IJ SOLN
3.0000 g | Freq: Once | INTRAVENOUS | Status: AC
  Administered 2013-05-13: 3 g via INTRAVENOUS
  Filled 2013-05-13: qty 6

## 2013-05-13 MED ORDER — DOCUSATE SODIUM 100 MG PO CAPS
100.0000 mg | ORAL_CAPSULE | Freq: Two times a day (BID) | ORAL | Status: DC
  Administered 2013-05-13 – 2013-05-14 (×4): 100 mg via ORAL
  Filled 2013-05-13 (×6): qty 1

## 2013-05-13 MED ORDER — ONDANSETRON HCL 4 MG/2ML IJ SOLN: 4.0000 mg | Freq: Four times a day (QID) | INTRAMUSCULAR | Status: DC | PRN

## 2013-05-13 MED ORDER — VITAMIN B-1 100 MG PO TABS
100.0000 mg | ORAL_TABLET | Freq: Every day | ORAL | Status: DC
  Administered 2013-05-13 – 2013-05-15 (×3): 100 mg via ORAL
  Filled 2013-05-13 (×3): qty 1

## 2013-05-13 MED ORDER — GABAPENTIN 100 MG PO CAPS
100.0000 mg | ORAL_CAPSULE | Freq: Three times a day (TID) | ORAL | Status: DC
  Administered 2013-05-13 – 2013-05-15 (×6): 100 mg via ORAL
  Filled 2013-05-13 (×8): qty 1

## 2013-05-13 MED ORDER — MAGNESIUM CITRATE PO SOLN
1.0000 | Freq: Once | ORAL | Status: AC | PRN
  Filled 2013-05-13: qty 296

## 2013-05-13 MED ORDER — CARVEDILOL 12.5 MG PO TABS
12.5000 mg | ORAL_TABLET | Freq: Two times a day (BID) | ORAL | Status: DC
  Administered 2013-05-13 – 2013-05-15 (×4): 12.5 mg via ORAL
  Filled 2013-05-13 (×6): qty 1

## 2013-05-13 MED ORDER — ONDANSETRON HCL 4 MG PO TABS: 4.0000 mg | ORAL_TABLET | Freq: Four times a day (QID) | ORAL | Status: DC | PRN

## 2013-05-13 MED ORDER — PANTOPRAZOLE SODIUM 40 MG PO TBEC
40.0000 mg | DELAYED_RELEASE_TABLET | Freq: Every day | ORAL | Status: DC
Start: 2013-05-14 — End: 2013-05-15
  Administered 2013-05-14 – 2013-05-15 (×2): 40 mg via ORAL
  Filled 2013-05-13 (×2): qty 1

## 2013-05-13 MED ORDER — INSULIN GLARGINE 100 UNIT/ML ~~LOC~~ SOLN
30.0000 [IU] | Freq: Every day | SUBCUTANEOUS | Status: DC
  Administered 2013-05-14: 30 [IU] via SUBCUTANEOUS
  Filled 2013-05-13 (×3): qty 0.3

## 2013-05-13 MED ORDER — ENOXAPARIN SODIUM 40 MG/0.4ML ~~LOC~~ SOLN
40.0000 mg | SUBCUTANEOUS | Status: DC
  Administered 2013-05-13 – 2013-05-14 (×2): 40 mg via SUBCUTANEOUS
  Filled 2013-05-13 (×3): qty 0.4

## 2013-05-13 MED ORDER — SORBITOL 70 % SOLN
30.0000 mL | Freq: Every day | Status: DC | PRN
  Filled 2013-05-13: qty 30

## 2013-05-13 MED ORDER — POLYETHYLENE GLYCOL 3350 17 G PO PACK
17.0000 g | PACK | Freq: Every day | ORAL | Status: DC | PRN
  Filled 2013-05-13: qty 1

## 2013-05-13 MED ORDER — SODIUM CHLORIDE 0.9 % IV SOLN
INTRAVENOUS | Status: AC
  Administered 2013-05-13: 75 mL/h via INTRAVENOUS
  Administered 2013-05-14: 04:00:00 via INTRAVENOUS

## 2013-05-13 MED ORDER — ACETAMINOPHEN 650 MG RE SUPP: 650.0000 mg | Freq: Four times a day (QID) | RECTAL | Status: DC | PRN

## 2013-05-13 MED ORDER — FOLIC ACID 1 MG PO TABS
1.0000 mg | ORAL_TABLET | Freq: Every day | ORAL | Status: DC
  Administered 2013-05-13 – 2013-05-15 (×3): 1 mg via ORAL
  Filled 2013-05-13 (×3): qty 1

## 2013-05-13 MED ORDER — ACETAMINOPHEN 325 MG PO TABS
650.0000 mg | ORAL_TABLET | Freq: Four times a day (QID) | ORAL | Status: DC | PRN
  Administered 2013-05-14: 650 mg via ORAL
  Filled 2013-05-13: qty 2

## 2013-05-13 MED ORDER — INSULIN ASPART 100 UNIT/ML ~~LOC~~ SOLN
0.0000 [IU] | Freq: Three times a day (TID) | SUBCUTANEOUS | Status: DC
  Administered 2013-05-13: 15 [IU] via SUBCUTANEOUS
  Administered 2013-05-14 (×3): 5 [IU] via SUBCUTANEOUS
  Administered 2013-05-15: 2 [IU] via SUBCUTANEOUS

## 2013-05-13 MED ORDER — ALUM & MAG HYDROXIDE-SIMETH 200-200-20 MG/5ML PO SUSP: 30.0000 mL | Freq: Four times a day (QID) | ORAL | Status: DC | PRN

## 2013-05-13 MED ORDER — SODIUM CHLORIDE 0.9 % IJ SOLN
3.0000 mL | Freq: Two times a day (BID) | INTRAMUSCULAR | Status: DC
  Administered 2013-05-14 – 2013-05-15 (×2): 3 mL via INTRAVENOUS

## 2013-05-13 MED ORDER — OXYCODONE HCL 5 MG PO TABS
5.0000 mg | ORAL_TABLET | ORAL | Status: DC | PRN
  Administered 2013-05-13: 5 mg via ORAL
  Filled 2013-05-13: qty 1

## 2013-05-13 MED ORDER — LISINOPRIL 40 MG PO TABS
40.0000 mg | ORAL_TABLET | Freq: Every day | ORAL | Status: DC
  Administered 2013-05-14 – 2013-05-15 (×2): 40 mg via ORAL
  Filled 2013-05-13 (×2): qty 1

## 2013-05-13 MED ORDER — HYDRALAZINE HCL 20 MG/ML IJ SOLN: 10.0000 mg | Freq: Four times a day (QID) | INTRAMUSCULAR | Status: DC | PRN

## 2013-05-13 NOTE — Evaluation (Addendum)
Physical Therapy Evaluation Patient Details Name: Tyler Patterson MRN: [id number] DOB: 14-May-1950 Today's Date: 05/13/2013 Time: [id number] PT Time Calculation (min): 20 min  PT Assessment / Plan / Recommendation History of Present Illness  Tyler Patterson is an 63 y.o. male that has had falls over the past 6-7 years.  He reports on Tuesday falling in the hallway and hitting his head and again this morning falling on his head and right shoulder.  He reports LOC with these episodes. He reports weakness to his right leg and arm for the last 3 years, but admits it has worsened "since the last fall." He states my "leg just gives out" and has to "drag it" often.    Clinical Impression  Patient presents with decreased independence and safety with mobility with reports of 8 falls in 6 months with head trauma and right UE pain/weakness after falling.  Feel he will benefit from skilled PT in the acute setting to allow return home with family assist and HHPT for safety and HEP education.  Did educate patient on fall risk reduction techniques for home.    PT Assessment  Patient needs continued PT services    Follow Up Recommendations  Supervision/Assistance - 24 hour;Home health PT    Does the patient have the potential to tolerate intense rehabilitation    N/A  Barriers to Discharge  None      Equipment Recommendations  None recommended by PT    Recommendations for Other Services   None  Frequency Min 3X/week    Precautions / Restrictions Precautions Required Braces or Orthoses: Other Brace/Splint Other Brace/Splint: [place] collar   Pertinent Vitals/Pain C/o neck pain, not rated      Mobility  Bed Mobility Overal bed mobility: Modified Independent Transfers Overall transfer level: Modified independent Ambulation/Gait Ambulation/Gait assistance: Min guard Ambulation Distance (Feet): 200 Feet Assistive device:  (pushing IV pole versus using wall rail) Gait Pattern/deviations:  Step-through pattern;Decreased step length - right;Wide base of support;Shuffle General Gait Details: hip external rotation on right and foot dragging floor, no loss of balance or episodes of knee buckling or recurvatum noted.          PT Diagnosis: Abnormality of gait;Generalized weakness  PT Problem List: Decreased strength;Decreased mobility;Decreased balance;Decreased safety awareness;Decreased knowledge of use of DME PT Treatment Interventions: DME instruction;Therapeutic exercise;Gait training;Balance training;Functional mobility training;Therapeutic activities;Patient/family education     PT Goals(Current goals can be found in the care plan section) Acute Rehab PT Goals Patient Stated Goal: To get neck brace off PT Goal Formulation: With patient/family Time For Goal Achievement: 05/27/13 Potential to Achieve Goals: Good  Visit Information  Last PT Received On: 05/13/13 Assistance Needed: +1 History of Present Illness: Tyler Patterson is an 63 y.o. male that has had falls over the past 6-7 years.  He reports on Tuesday falling in the hallway and hitting his head and again this morning falling on his head and right shoulder.  He reports LOC with these episodes. He reports weakness to his right leg and arm for the last 3 years, but admits it has worsened "since the last fall." He states my "leg just gives out" and has to "drag it" often.         Prior [place] expects to be discharged to:: Private residence Living Arrangements: Spouse/significant other Available Help at Discharge: Family Type of Home: Apartment Home Access: Level entry Home Layout: One Wise: Environmental consultant - 2 wheels;Cane - single point Prior  Function Level of Independence: Independent with assistive device(s) Comments: reports 8 falls in 6 months Communication Communication: No difficulties Dominant Hand: Right    Cognition  Cognition Arousal/Alertness:  Awake/alert Behavior During Therapy: WFL for tasks assessed/performed Overall Cognitive Status: Within Functional Limits for tasks assessed    Extremity/Trunk Assessment Upper Extremity Assessment Upper Extremity Assessment: RUE deficits/detail RUE Deficits / Details: shoulder flexion to about 100 degrees with pain along deltoid insertion, strength shoulder flexion 3+/5 , elbow flexion 4-/5, grip decreased compared to left Lower Extremity Assessment Lower Extremity Assessment: RLE deficits/detail RLE Deficits / Details: hip flexion 3-/5, knee extension 3+/5, ankle DF 3+/5 RLE Sensation: decreased light touch (anterior thigh up to hip)   Balance Balance Overall balance assessment: History of Falls;Needs assistance Standing balance-Leahy Scale: Fair Standing balance comment: able to stand unsupported, but needs UE assist for ambulation  End of Session PT - End of Session Equipment Utilized During Treatment: Gait belt Activity Tolerance: Patient tolerated treatment well Patient left: in bed;with call bell/phone within reach;with family/visitor present  GP     [facility] 05/13/2013, 4:59 PM Tyler Patterson, [place] 05/13/2013

## 2013-05-13 NOTE — ED Provider Notes (Signed)
CSN: [id number]     Arrival date & time 05/13/13  0820 History   First MD Initiated Contact with Patient 05/13/13 [phone]     Chief Complaint  Patient presents with  . Fall  . Weakness  . Blurred Vision     (Consider location/radiation/quality/duration/timing/severity/associated sxs/prior Treatment) Patient is a 63 y.o. male presenting with fall and weakness.  Fall Associated symptoms include chest pain and headaches. Pertinent negatives include no abdominal pain and no shortness of breath.  Weakness Associated symptoms include chest pain and headaches. Pertinent negatives include no abdominal pain and no shortness of breath.   patient presents with syncope and fall 2 days ago. He states he has some chronic weakness of his right side that he's had for the last couple years. He states his gout worse over the last while, but worse last couple days. States she's been having difficulty using his right hand right leg. He states his leg is not work quite right but began to drag more. He states that 2 days ago he developed a severe headache and passed out. No seizure activity. He reportedly hit his head on the way down. Since then he has had increasing weakness on the right side. He has some pain in his neck and head. No numbness. He states he's also had occasional chest pain. No cough. He also states he has had some blurred vision in the right eye.  Past Medical History  Diagnosis Date  . Diabetes mellitus   . Hypertension   . Shortness of breath   . GERD (gastroesophageal reflux disease)   . Headache(784.0)   . Headache 05/13/2013   Past Surgical History  Procedure Laterality Date  . Hernia repair     History reviewed. No pertinent family history. History  Substance Use Topics  . Smoking status: Current Some Day Smoker    Types: Cigars  . Smokeless tobacco: Never Used     Comment: smokes black & mild cigars on the weekends  . Alcohol Use: Yes     Comment: occ    Review of Systems   Constitutional: Negative for activity change and appetite change.  Eyes: Positive for visual disturbance.  Respiratory: Positive for cough. Negative for shortness of breath.   Cardiovascular: Positive for chest pain.  Gastrointestinal: Negative for abdominal pain.  Musculoskeletal: Positive for neck pain. Negative for back pain.  Skin: Negative for wound.  Neurological: Positive for syncope, weakness and headaches.  Psychiatric/Behavioral: Negative for behavioral problems.      Allergies  Other  Home Medications   No current outpatient prescriptions on file. BP 177/93  Pulse 66  Temp(Src) 97.4 F (36.3 C) (Oral)  Resp 16  Ht 5\' 8"  (1.727 m)  Wt 155 lb 13.8 oz (70.7 kg)  BMI 23.70 kg/m2  SpO2 100% Physical Exam  Constitutional: He is oriented to person, place, and time. He appears well-developed.  HENT:  Head: Normocephalic.  Eyes: EOM are normal. Pupils are equal, round, and reactive to light.  Neck: Normal range of motion.  Cardiovascular: Normal rate.   Pulmonary/Chest: Effort normal and breath sounds normal.  Abdominal: Soft.  Musculoskeletal: He exhibits no edema.  Neurological: He is alert and oriented to person, place, and time.  Bases symmetric. Extraocular was intact. Decreased strength on right side, involving upper lower extremities. Has approximately 4+ or 5 straight and in the right arm, but is not as strong as contralateral. He is decreased straight leg raise with strength on the right lower extremity. Finger-nose  intact bilaterally.  Skin: Skin is warm.    ED Course  Procedures (including critical care time) Labs Review Labs Reviewed  CBC - Abnormal; Notable for the following:    WBC 3.5 (*)    RBC 4.04 (*)    HCT 38.4 (*)    All other components within normal limits  COMPREHENSIVE METABOLIC PANEL - Abnormal; Notable for the following:    Sodium 135 (*)    Glucose, Bld 298 (*)    Albumin 3.4 (*)    GFR calc non Af Amer 90 (*)    All other  components within normal limits  URINALYSIS, ROUTINE W REFLEX MICROSCOPIC - Abnormal; Notable for the following:    Glucose, UA >1000 (*)    Hgb urine dipstick SMALL (*)    All other components within normal limits  CBC - Abnormal; Notable for the following:    RBC 4.18 (*)    MCH 34.4 (*)    MCHC 36.6 (*)    All other components within normal limits  POCT I-STAT, CHEM 8 - Abnormal; Notable for the following:    Sodium 135 (*)    Glucose, Bld 302 (*)    All other components within normal limits  ETHANOL  PROTIME-INR  APTT  DIFFERENTIAL  TROPONIN I  URINE RAPID DRUG SCREEN (HOSP PERFORMED)  URINE MICROSCOPIC-ADD ON  CREATININE, SERUM  MAGNESIUM  HEMOGLOBIN A1C  TSH  POCT I-STAT TROPONIN I   Imaging Review Ct Head Wo Contrast  05/13/2013   CLINICAL DATA:  [place] Tuesday. Loss of consciousness at that time. Also fell today but did not hit head. Blurred vision. Chronic right-sided weakness. Neck pain.  EXAM: CT HEAD WITHOUT CONTRAST  CT CERVICAL SPINE WITHOUT CONTRAST  TECHNIQUE: Multidetector CT imaging of the head and cervical spine was performed following the standard protocol without intravenous contrast. Multiplanar CT image reconstructions of the cervical spine were also generated.  COMPARISON:  MR brain 07/2011.  CT head 08/19/2011.  FINDINGS: CT HEAD FINDINGS  Premature for age cerebral and cerebellar atrophy. Chronic microvascular ischemic change affects the periventricular and subcortical white matter. No evidence for acute infarction, hemorrhage, mass lesion, hydrocephalus, or extra-axial fluid. Calvarium intact. Chronic left maxillary sinus disease. Negative orbits.  CT CERVICAL SPINE FINDINGS  There is no visible cervical spine fracture, traumatic subluxation, prevertebral soft tissue swelling, or intraspinal hematoma. Intervertebral disc spaces are preserved. Limbus vertebra C5-7. No lung apex lesion. No visible neck masses.  IMPRESSION: No acute intracranial findings. Atrophy  with small vessel disease is stable from 2013.  No cervical spine fracture or traumatic subluxation.   Electronically Signed   By: Rolla Flatten M.D.   On: 05/13/2013 10:48   Ct Cervical Spine Wo Contrast  05/13/2013   CLINICAL DATA:  [place] Tuesday. Loss of consciousness at that time. Also fell today but did not hit head. Blurred vision. Chronic right-sided weakness. Neck pain.  EXAM: CT HEAD WITHOUT CONTRAST  CT CERVICAL SPINE WITHOUT CONTRAST  TECHNIQUE: Multidetector CT imaging of the head and cervical spine was performed following the standard protocol without intravenous contrast. Multiplanar CT image reconstructions of the cervical spine were also generated.  COMPARISON:  MR brain 07/2011.  CT head 08/19/2011.  FINDINGS: CT HEAD FINDINGS  Premature for age cerebral and cerebellar atrophy. Chronic microvascular ischemic change affects the periventricular and subcortical white matter. No evidence for acute infarction, hemorrhage, mass lesion, hydrocephalus, or extra-axial fluid. Calvarium intact. Chronic left maxillary sinus disease. Negative orbits.  CT CERVICAL SPINE  FINDINGS  There is no visible cervical spine fracture, traumatic subluxation, prevertebral soft tissue swelling, or intraspinal hematoma. Intervertebral disc spaces are preserved. Limbus vertebra C5-7. No lung apex lesion. No visible neck masses.  IMPRESSION: No acute intracranial findings. Atrophy with small vessel disease is stable from 2013.  No cervical spine fracture or traumatic subluxation.   Electronically Signed   By: Rolla Flatten M.D.   On: 05/13/2013 10:48      MDM   Final diagnoses:  Syncope  Fall  Right sided weakness    Patient with fall. Possible syncope. Has had acute on chronic right-sided weakness that got worse after the fall. Head CT reassuring. Cervical spine CT also done. Since weakness increased after the fall may need more cervical spine evaluation. Admitted to internal medicine with consult to neurology and  trauma surgery.    Jasper Riling. Alvino Chapel, MD 05/13/13 1534

## 2013-05-13 NOTE — ED Notes (Signed)
MD at bedside. 

## 2013-05-13 NOTE — Consult Note (Signed)
Reason for Consult: fall, right sided weakness Referring Physician: Davonna Belling    HPI: Tyler Patterson is a 63 year old male with a history of diabetes mellitus, hypertension, GERD and hernia repair.  He presents to [facility] today following on Tuesday and again this morning.  He reports on Tuesday falling in the hallway and hitting his head and again this morning falling on his head and right shoulder.  He called the [place] and they advised for him to seek immediate attention.  The patient reports falls over the past 6-7 years.  He reports LOC with these episodes.  He reports weakness to his right leg and arm for the last 3 years, but admits it has worsened "since the last fall."  He states my "leg just gives out" and has to "drag it" often.  He was admitted in 2013 following a fall and a syncopal episode, a complete work up including an MRI, EEG, echocardiogram was unrevealing.  He drinks a 12 pack on the weekends, last time he had alcohol was Friday.  During the last 2 falls, he reports LOC, he was found by his family.  He reports that the family did not witness the last 2 falls, but found him to be confused, slurring his words.  This eventually resolved.  He denies loss of bowel or bladder control.  Denies a history of stroke, seizures or heart disease.  Apparently he was worked up for syncope in recent months, I am unable to find records of the supposed cardiology visit.    Past Medical History  Diagnosis Date  . Diabetes mellitus   . Hypertension   . Shortness of breath   . GERD (gastroesophageal reflux disease)   . [id number](127.5)     Past Surgical History  Procedure Laterality Date  . Hernia repair      History reviewed. No pertinent family history.  Social History:  reports that he has been smoking Cigars.  He has never used smokeless tobacco. He reports that he drinks alcohol. He reports that he does not use illicit drugs.  Allergies:  Allergies  Allergen Reactions  . Other      Unknown medication that he is allergic to. Starts with an [place]    Medications: Scheduled Meds: Continuous Infusions: PRN Meds:.    Results for orders placed during the hospital encounter of 05/13/13 (from the past 48 hour(s))  TROPONIN I     Status: None   Collection Time    05/13/13  8:54 AM      Result Value Ref Range   Troponin I <0.30  <0.30 ng/mL   Comment:            Due to the release kinetics of cTnI,     a negative result within the first hours     of the onset of symptoms does not rule out     myocardial infarction with certainty.     If myocardial infarction is still suspected,     repeat the test at appropriate intervals.  ETHANOL     Status: None   Collection Time    05/13/13  9:02 AM      Result Value Ref Range   Alcohol, Ethyl (B) <11  0 - 11 mg/dL   Comment:            LOWEST DETECTABLE LIMIT FOR     SERUM ALCOHOL IS 11 mg/dL     FOR MEDICAL PURPOSES ONLY  PROTIME-INR     Status:  None   Collection Time    05/13/13  9:02 AM      Result Value Ref Range   Prothrombin Time 12.1  11.6 - 15.2 seconds   INR 0.91  0.00 - 1.49  APTT     Status: None   Collection Time    05/13/13  9:02 AM      Result Value Ref Range   aPTT 27  24 - 37 seconds  CBC     Status: Abnormal   Collection Time    05/13/13  9:02 AM      Result Value Ref Range   WBC 3.5 (*) 4.0 - 10.5 K/uL   RBC 4.04 (*) 4.22 - 5.81 MIL/uL   Hemoglobin 13.6  13.0 - 17.0 g/dL   HCT 38.4 (*) 39.0 - 52.0 %   MCV 95.0  78.0 - 100.0 fL   MCH 33.7  26.0 - 34.0 pg   MCHC 35.4  30.0 - 36.0 g/dL   RDW 12.9  11.5 - 15.5 %   Platelets 178  150 - 400 K/uL  DIFFERENTIAL     Status: None   Collection Time    05/13/13  9:02 AM      Result Value Ref Range   Neutrophils Relative % 48  43 - 77 %   Neutro Abs 1.7  1.7 - 7.7 K/uL   Lymphocytes Relative 39  12 - 46 %   Lymphs Abs 1.4  0.7 - 4.0 K/uL   Monocytes Relative 11  3 - 12 %   Monocytes Absolute 0.4  0.1 - 1.0 K/uL   Eosinophils Relative 2  0 - 5 %    Eosinophils Absolute 0.1  0.0 - 0.7 K/uL   Basophils Relative 0  0 - 1 %   Basophils Absolute 0.0  0.0 - 0.1 K/uL  COMPREHENSIVE METABOLIC PANEL     Status: Abnormal   Collection Time    05/13/13  9:02 AM      Result Value Ref Range   Sodium 135 (*) 137 - 147 mEq/L   Potassium 4.3  3.7 - 5.3 mEq/L   Chloride 97  96 - 112 mEq/L   CO2 25  19 - 32 mEq/L   Glucose, Bld 298 (*) 70 - 99 mg/dL   BUN 13  6 - 23 mg/dL   Creatinine, Ser 0.89  0.50 - 1.35 mg/dL   Calcium 8.9  8.4 - 10.5 mg/dL   Total Protein 7.1  6.0 - 8.3 g/dL   Albumin 3.4 (*) 3.5 - 5.2 g/dL   AST 15  0 - 37 U/L   ALT 12  0 - 53 U/L   Alkaline Phosphatase 84  39 - 117 U/L   Total Bilirubin 0.7  0.3 - 1.2 mg/dL   GFR calc non Af Amer 90 (*) >90 mL/min   GFR calc Af Amer >90  >90 mL/min   Comment: (NOTE)     The eGFR has been calculated using the CKD EPI equation.     This calculation has not been validated in all clinical situations.     eGFR's persistently <90 mL/min signify possible Chronic Kidney     Disease.  POCT I-STAT TROPONIN I     Status: None   Collection Time    05/13/13  9:35 AM      Result Value Ref Range   Troponin i, poc 0.01  0.00 - 0.08 ng/mL   Comment 3  Comment: Due to the release kinetics of cTnI,     a negative result within the first hours     of the onset of symptoms does not rule out     myocardial infarction with certainty.     If myocardial infarction is still suspected,     repeat the test at appropriate intervals.  POCT I-STAT, CHEM 8     Status: Abnormal   Collection Time    05/13/13  9:36 AM      Result Value Ref Range   Sodium 135 (*) 137 - 147 mEq/L   Potassium 4.2  3.7 - 5.3 mEq/L   Chloride 98  96 - 112 mEq/L   BUN 13  6 - 23 mg/dL   Creatinine, Ser 1.00  0.50 - 1.35 mg/dL   Glucose, Bld 302 (*) 70 - 99 mg/dL   Calcium, Ion 1.18  1.13 - 1.30 mmol/L   TCO2 26  0 - 100 mmol/L   Hemoglobin 14.6  13.0 - 17.0 g/dL   HCT 43.0  39.0 - 52.0 %  URINE RAPID DRUG SCREEN  (HOSP PERFORMED)     Status: None   Collection Time    05/13/13  9:44 AM      Result Value Ref Range   Opiates NONE DETECTED  NONE DETECTED   Cocaine NONE DETECTED  NONE DETECTED   Benzodiazepines NONE DETECTED  NONE DETECTED   Amphetamines NONE DETECTED  NONE DETECTED   Tetrahydrocannabinol NONE DETECTED  NONE DETECTED   Barbiturates NONE DETECTED  NONE DETECTED   Comment:            DRUG SCREEN FOR MEDICAL PURPOSES     ONLY.  IF CONFIRMATION IS NEEDED     FOR ANY PURPOSE, NOTIFY LAB     WITHIN 5 DAYS.                LOWEST DETECTABLE LIMITS     FOR URINE DRUG SCREEN     Drug Class       Cutoff (ng/mL)     Amphetamine      1000     Barbiturate      200     Benzodiazepine   035     Tricyclics       009     Opiates          300     Cocaine          300     THC              50  URINALYSIS, ROUTINE W REFLEX MICROSCOPIC     Status: Abnormal   Collection Time    05/13/13  9:44 AM      Result Value Ref Range   Color, Urine YELLOW  YELLOW   APPearance CLEAR  CLEAR   Specific Gravity, Urine 1.016  1.005 - 1.030   pH 7.0  5.0 - 8.0   Glucose, UA >1000 (*) NEGATIVE mg/dL   Hgb urine dipstick SMALL (*) NEGATIVE   Bilirubin Urine NEGATIVE  NEGATIVE   Ketones, ur NEGATIVE  NEGATIVE mg/dL   Protein, ur NEGATIVE  NEGATIVE mg/dL   Urobilinogen, UA 0.2  0.0 - 1.0 mg/dL   Nitrite NEGATIVE  NEGATIVE   Leukocytes, UA NEGATIVE  NEGATIVE  URINE MICROSCOPIC-ADD ON     Status: None   Collection Time    05/13/13  9:44 AM      Result Value Ref Range   Squamous  Epithelial / LPF RARE  RARE   WBC, UA 0-2  <3 WBC/hpf   RBC / HPF 0-2  <3 RBC/hpf    Ct Head Wo Contrast  05/13/2013   CLINICAL DATA:  [place] Tuesday. Loss of consciousness at that time. Also fell today but did not hit head. Blurred vision. Chronic right-sided weakness. Neck pain.  EXAM: CT HEAD WITHOUT CONTRAST  CT CERVICAL SPINE WITHOUT CONTRAST  TECHNIQUE: Multidetector CT imaging of the head and cervical spine was performed  following the standard protocol without intravenous contrast. Multiplanar CT image reconstructions of the cervical spine were also generated.  COMPARISON:  MR brain 07/2011.  CT head 08/19/2011.  FINDINGS: CT HEAD FINDINGS  Premature for age cerebral and cerebellar atrophy. Chronic microvascular ischemic change affects the periventricular and subcortical white matter. No evidence for acute infarction, hemorrhage, mass lesion, hydrocephalus, or extra-axial fluid. Calvarium intact. Chronic left maxillary sinus disease. Negative orbits.  CT CERVICAL SPINE FINDINGS  There is no visible cervical spine fracture, traumatic subluxation, prevertebral soft tissue swelling, or intraspinal hematoma. Intervertebral disc spaces are preserved. Limbus vertebra C5-7. No lung apex lesion. No visible neck masses.  IMPRESSION: No acute intracranial findings. Atrophy with small vessel disease is stable from 2013.  No cervical spine fracture or traumatic subluxation.   Electronically Signed   By: Rolla Flatten M.D.   On: 05/13/2013 10:48   Ct Cervical Spine Wo Contrast  05/13/2013   CLINICAL DATA:  [place] Tuesday. Loss of consciousness at that time. Also fell today but did not hit head. Blurred vision. Chronic right-sided weakness. Neck pain.  EXAM: CT HEAD WITHOUT CONTRAST  CT CERVICAL SPINE WITHOUT CONTRAST  TECHNIQUE: Multidetector CT imaging of the head and cervical spine was performed following the standard protocol without intravenous contrast. Multiplanar CT image reconstructions of the cervical spine were also generated.  COMPARISON:  MR brain 07/2011.  CT head 08/19/2011.  FINDINGS: CT HEAD FINDINGS  Premature for age cerebral and cerebellar atrophy. Chronic microvascular ischemic change affects the periventricular and subcortical white matter. No evidence for acute infarction, hemorrhage, mass lesion, hydrocephalus, or extra-axial fluid. Calvarium intact. Chronic left maxillary sinus disease. Negative orbits.  CT CERVICAL SPINE  FINDINGS  There is no visible cervical spine fracture, traumatic subluxation, prevertebral soft tissue swelling, or intraspinal hematoma. Intervertebral disc spaces are preserved. Limbus vertebra C5-7. No lung apex lesion. No visible neck masses.  IMPRESSION: No acute intracranial findings. Atrophy with small vessel disease is stable from 2013.  No cervical spine fracture or traumatic subluxation.   Electronically Signed   By: Rolla Flatten M.D.   On: 05/13/2013 10:48    Review of Systems  All other systems reviewed and are negative.   Blood pressure 149/119, pulse 64, temperature 97.9 F (36.6 C), resp. rate 23, height 5' 8"  (1.727 m), weight 80.74 kg (178 lb), SpO2 100.00%. Physical Exam  Constitutional: He is oriented to person, place, and time. He appears well-developed and well-nourished. No distress.  HENT:  Head: Normocephalic and atraumatic.  Right Ear: External ear normal.  Left Ear: External ear normal.  Nose: Nose normal.  Mouth/Throat: Oropharynx is clear and moist. No oropharyngeal exudate.  Eyes: Conjunctivae and EOM are normal. Pupils are equal, round, and reactive to light. Right eye exhibits no discharge. Left eye exhibits no discharge. No scleral icterus.  Neck: Normal range of motion. Neck supple.  Cardiovascular: Normal rate, regular rhythm, normal heart sounds and intact distal pulses.  Exam reveals no gallop and no friction rub.  No murmur heard. Respiratory: Effort normal and breath sounds normal. No respiratory distress. He has no wheezes. He has no rales. He exhibits no tenderness.  GI: Soft. Bowel sounds are normal. He exhibits no distension and no mass. There is no tenderness. There is no rebound and no guarding.  Musculoskeletal: He exhibits no edema and no tenderness.  ttp cervical spine, in c collar  Lymphadenopathy:    He has no cervical adenopathy.  Neurological: He is alert and oriented to person, place, and time. No cranial nerve deficit. Coordination  normal.  Push and pulls are strong and equal, right arm grip 4/5, left arm is 5/5.  No neurological deficits  Skin: Skin is warm. He is not diaphoretic. No pallor.  moist  Psychiatric: He has a normal mood and affect. His behavior is normal. Judgment and thought content normal.    Assessment/Plan: Alcohol abuse Diabetes mellitus  Hypertension Questionable syncopal episode Ground level fall Neck pain  CT of head and neck are negative from acute abnormalities.  He  Does not exhibit abdominal tenderness, neurological deficits and the rest of his physical exam is fairly benign.  He has stable vitals signs, stable hemoglobin and hematocrit.  The weakness is unimpressive and from what the patient states is chronic.  He did mention that may be a bit worse since the fall on Tuesday.  With this, may obtain a MRI of spine to rule out cervical spine injury, if unable may also do flexion and extension films to ensure that there is no c spine instability.  I am not sure whether he is honest with his alcohol consumption, but could certainly contribute to the frequency of falls.  He has had a negative syncopal work up in 2013.  The hospitalist team will be seeing the patient to make further recommendations for this.  Thank you for the consult.  Dr. Redmond Pulling to evaluate the patient shortly.     Tyler Patterson ANP-BC 05/13/2013, 12:33 PM

## 2013-05-13 NOTE — ED Notes (Signed)
PT reports hx of R sided weakness with syncopal episode Tues and another episode this AM. PT reported blurred vision in R eye, but upon snellen chart exam L eye 20/200 R eye 20/50. Reports vision seems to be back in R eye, but L is blurrier. PT reports L eye bluriness > R eye is his baseline.

## 2013-05-13 NOTE — ED Notes (Signed)
Spoke to MD regarding PT's c-collar and advised to leave on for more testing. Also mentioned Glu 302 and PT's wife reports CBG's averaging ~400 normally. PT's current DM mgmt includes Lantus 10un @ bedtime, Metformin 850mg  TID, Novalog 8un before meals BID. PT's wife reports that provider suggested a 2un increase in Lantus to help manage DM, but PT woke diaphoretic and barely able to speak with low CBG and returned dose to 10units.

## 2013-05-13 NOTE — Consult Note (Signed)
I saw the patient, participated in the history, exam and medical decision making, and concur with the physician assistant's note above.  Alert, ox 3 MAE.  Maybe subtle decrease in RUE strength compared to LUE C/o some neck pain. But also has vague multiple complaints. On palpation of C spine - has some point TTP at c7/t1. Reports some discomfort in center when asked to flex neck. On neck rotation - c/o some pain along SCM.   Syncope w/u per neurology and int medicine Neck pain - Maintain C spine precautions; Will check flex/ext cspine   Leighton Ruff. Redmond Pulling, MD, FACS General, Bariatric, & Minimally Invasive Surgery [facility], [place]

## 2013-05-13 NOTE — ED Notes (Addendum)
Pt reports right leg gave out on him on Tuesday causing him to fall and hit his head on the wall. Family reports +LOC at that time about 5-10 mins. Pt reports same happened this morning while ambulating down hall but did not hit his head today. Pt does not take blood thinners at this time. Pt reports blurred vision and pain to right arm. Pt reports vision is like wax paper over his right eye. Pt also has weakness to right hand for some time.

## 2013-05-13 NOTE — Consult Note (Signed)
Reason for Consult:Syncope Referring Physician: Grandville Silos  CC: Syncope  HPI: Tyler Patterson is an 63 y.o. male that has had falls over the past 6-7 years.  He reports on Tuesday falling in the hallway and hitting his head and again this morning falling on his head and right shoulder. He called the [place] and they advised for him to seek immediate attention.  He reports LOC with these episodes. He reports weakness to his right leg and arm for the last 3 years, but admits it has worsened "since the last fall." He states my "leg just gives out" and has to "drag it" often.  He reports the episodes are random.  He has recently has these two and it may not happen again for a few months.  He has no warning and no recollection of what happens.  He was admitted in 2013 following a fall and a syncopal episode, a complete work up including an MRI, EEG, echocardiogram was unrevealing. He drinks a 12 pack on the weekends, last time he had alcohol was Friday. During the last 2 falls, he reports LOC, he was found by his family. He reports that the family did not witness the last 2 falls, but found him to be confused, slurring his words. This eventually resolved. He denies loss of bowel or bladder control. Denies a history of stroke, seizures or heart disease. Apparently he was worked up for syncope in recent months.    Past Medical History  Diagnosis Date  . Diabetes mellitus   . Hypertension   . Shortness of breath   . GERD (gastroesophageal reflux disease)   . Headache(784.0)   . Headache 05/13/2013    Past Surgical History  Procedure Laterality Date  . Hernia repair     Family history: Hypertension  Social History:  reports that he has been smoking Cigars.  He has never used smokeless tobacco. He reports that he drinks alcohol. He reports that he does not use illicit drugs.  Allergies  Allergen Reactions  . Other     Unknown medication that he is allergic to. Starts with an [place]    Medications:  I  have reviewed the patient's current medications. Scheduled: . carvedilol  12.5 mg Oral BID WC  . docusate sodium  100 mg Oral BID  . enoxaparin (LOVENOX) injection  40 mg Subcutaneous Q24H  . folic acid  1 mg Oral Daily  . gabapentin  100 mg Oral TID  . insulin aspart  0-15 Units Subcutaneous TID WC  . insulin glargine  30 Units Subcutaneous QHS  . [START ON 05/14/2013] lisinopril  40 mg Oral Daily  . [START ON 05/14/2013] pantoprazole  40 mg Oral Q0600  . sodium chloride  3 mL Intravenous Q12H  . thiamine  100 mg Oral Daily    ROS: History obtained from the patient  General ROS: negative for - chills, fatigue, fever, night sweats, weight gain or weight loss Psychological ROS: negative for - behavioral disorder, hallucinations, memory difficulties, mood swings or suicidal ideation Ophthalmic ROS: negative for - blurry vision, double vision, eye pain or loss of vision ENT ROS: negative for - epistaxis, nasal discharge, oral lesions, sore throat, tinnitus or vertigo Allergy and Immunology ROS: negative for - hives or itchy/watery eyes Hematological and Lymphatic ROS: negative for - bleeding problems, bruising or swollen lymph nodes Endocrine ROS: negative for - galactorrhea, hair pattern changes, polydipsia/polyuria or temperature intolerance Respiratory ROS: negative for - cough, hemoptysis, shortness of breath or wheezing Cardiovascular  ROS: negative for - chest pain, dyspnea on exertion, edema or irregular heartbeat Gastrointestinal ROS: negative for - abdominal pain, diarrhea, hematemesis, nausea/vomiting or stool incontinence Genito-Urinary ROS: negative for - dysuria, hematuria, incontinence or urinary frequency/urgency Musculoskeletal ROS: negative for - joint swelling or muscular weakness Neurological ROS: as noted in HPI, headache Dermatological ROS: negative for rash and skin lesion changes  Physical Examination: Blood pressure 177/93, pulse 66, temperature 97.4 F (36.3 C),  temperature source Oral, resp. rate 16, height 5\' 8"  (1.727 m), weight 70.7 kg (155 lb 13.8 oz), SpO2 100.00%.  Neurologic Examination Mental Status: Alert, oriented, thought content appropriate.  Speech fluent without evidence of aphasia.  Able to follow 3 step commands without difficulty. Cranial Nerves: II: Discs flat bilaterally; Visual fields grossly normal, pupils equal, round, reactive to light and accommodation III,IV, VI: ptosis not present, extra-ocular motions intact bilaterally V,VII: smile symmetric, facial light touch sensation normal bilaterally VIII: hearing normal bilaterally IX,X: gag reflex present XI: bilateral shoulder shrug XII: midline tongue extension Motor: Right : Upper extremity   5-/5    Left:     Upper extremity   5/5  Lower extremity   5/5     Lower extremity   5/5 Tone and bulk:normal tone throughout; no atrophy noted Sensory: Pinprick and light touch intact throughout, bilaterally Deep Tendon Reflexes: 2+ and symmetric throughout Plantars: Right: mute   Left: mute Cerebellar: normal finger-to-nose and normal heel-to-shin test Gait: Unable to test CV: pulses palpable throughout     Laboratory Studies:   Basic Metabolic Panel:  Recent Labs Lab 05/13/13 0902 05/13/13 0936 05/13/13 1430  NA 135* 135*  --   K 4.3 4.2  --   CL 97 98  --   CO2 25  --   --   GLUCOSE 298* 302*  --   BUN 13 13  --   CREATININE 0.89 1.00 0.80  CALCIUM 8.9  --   --   MG  --   --  1.6    Liver Function Tests:  Recent Labs Lab 05/13/13 0902  AST 15  ALT 12  ALKPHOS 84  BILITOT 0.7  PROT 7.1  ALBUMIN 3.4*   No results found for this basename: LIPASE, AMYLASE,  in the last 168 hours No results found for this basename: AMMONIA,  in the last 168 hours  CBC:  Recent Labs Lab 05/13/13 0902 05/13/13 0936 05/13/13 1430  WBC 3.5*  --  4.8  NEUTROABS 1.7  --   --   HGB 13.6 14.6 14.4  HCT 38.4* 43.0 39.3  MCV 95.0  --  94.0  PLT 178  --  186     Cardiac Enzymes:  Recent Labs Lab 05/13/13 0854  TROPONINI <0.30    BNP: No components found with this basename: POCBNP,   CBG: No results found for this basename: GLUCAP,  in the last 168 hours  Microbiology: Results for orders placed during the hospital encounter of 03/25/10  URINE CULTURE     Status: None   Collection Time    03/25/10 11:38 AM      Result Value Ref Range Status   Specimen Description URINE, RANDOM   Final   Special Requests CX ADDED ON 03/25/10 @ 1700   Final   Culture  Setup Time [id number]   Final   Colony Count NO GROWTH   Final   Culture NO GROWTH   Final   Report Status 03/26/2010 FINAL   Final  CULTURE, BLOOD (ROUTINE  X 2)     Status: None   Collection Time    03/25/10  3:08 PM      Result Value Ref Range Status   Specimen Description BLOOD LEFT HAND   Final   Special Requests     Final   Value: BOTTLES DRAWN AEROBIC AND ANAEROBIC 10CC AER 5CC ANA   Culture  Setup Time [id number]   Final   Culture NO GROWTH 5 DAYS   Final   Report Status 03/31/2010 FINAL   Final  CULTURE, BLOOD (ROUTINE X 2)     Status: None   Collection Time    03/25/10  3:15 PM      Result Value Ref Range Status   Specimen Description BLOOD RIGHT HAND   Final   Special Requests BOTTLES DRAWN AEROBIC ONLY 5CC   Final   Culture  Setup Time [id number]   Final   Culture NO GROWTH 5 DAYS   Final   Report Status 03/31/2010 FINAL   Final  MRSA PCR SCREENING     Status: None   Collection Time    03/25/10  4:35 PM      Result Value Ref Range Status   MRSA by PCR    NEGATIVE Final   Value: NEGATIVE            The GeneXpert MRSA Assay (FDA     approved for NASAL specimens     only), is one component of a     comprehensive MRSA colonization     surveillance program. It is not     intended to diagnose MRSA     infection nor to guide or     monitor treatment for     MRSA infections.    Coagulation Studies:  Recent Labs  05/13/13 0902  LABPROT 12.1  INR 0.91     Urinalysis:  Recent Labs Lab 05/13/13 0944  COLORURINE YELLOW  LABSPEC 1.016  PHURINE 7.0  GLUCOSEU >1000*  HGBUR SMALL*  BILIRUBINUR NEGATIVE  KETONESUR NEGATIVE  PROTEINUR NEGATIVE  UROBILINOGEN 0.2  NITRITE NEGATIVE  LEUKOCYTESUR NEGATIVE    Lipid Panel:     Component Value Date/Time   CHOL 171 06/26/2010 2057   TRIG 57 06/26/2010 2057   HDL 61 06/26/2010 2057   CHOLHDL 2.8 Ratio 06/26/2010 2057   VLDL 11 06/26/2010 2057   LDLCALC 99 06/26/2010 2057    HgbA1C:  Lab Results  Component Value Date   HGBA1C 11.1* 08/20/2011    Urine Drug Screen:     Component Value Date/Time   LABOPIA NONE DETECTED 05/13/2013 0944   COCAINSCRNUR NONE DETECTED 05/13/2013 0944   LABBENZ NONE DETECTED 05/13/2013 0944   AMPHETMU NONE DETECTED 05/13/2013 0944   THCU NONE DETECTED 05/13/2013 0944   LABBARB NONE DETECTED 05/13/2013 0944    Alcohol Level:  Recent Labs Lab 05/13/13 0902  ETH <11    Imaging: Ct Head Wo Contrast  05/13/2013   CLINICAL DATA:  [place] Tuesday. Loss of consciousness at that time. Also fell today but did not hit head. Blurred vision. Chronic right-sided weakness. Neck pain.  EXAM: CT HEAD WITHOUT CONTRAST  CT CERVICAL SPINE WITHOUT CONTRAST  TECHNIQUE: Multidetector CT imaging of the head and cervical spine was performed following the standard protocol without intravenous contrast. Multiplanar CT image reconstructions of the cervical spine were also generated.  COMPARISON:  MR brain 07/2011.  CT head 08/19/2011.  FINDINGS: CT HEAD FINDINGS  Premature for age cerebral and cerebellar atrophy. Chronic microvascular ischemic  change affects the periventricular and subcortical white matter. No evidence for acute infarction, hemorrhage, mass lesion, hydrocephalus, or extra-axial fluid. Calvarium intact. Chronic left maxillary sinus disease. Negative orbits.  CT CERVICAL SPINE FINDINGS  There is no visible cervical spine fracture, traumatic subluxation, prevertebral soft tissue  swelling, or intraspinal hematoma. Intervertebral disc spaces are preserved. Limbus vertebra C5-7. No lung apex lesion. No visible neck masses.  IMPRESSION: No acute intracranial findings. Atrophy with small vessel disease is stable from 2013.  No cervical spine fracture or traumatic subluxation.   Electronically Signed   By: Rolla Flatten M.D.   On: 05/13/2013 10:48   Ct Cervical Spine Wo Contrast  05/13/2013   CLINICAL DATA:  [place] Tuesday. Loss of consciousness at that time. Also fell today but did not hit head. Blurred vision. Chronic right-sided weakness. Neck pain.  EXAM: CT HEAD WITHOUT CONTRAST  CT CERVICAL SPINE WITHOUT CONTRAST  TECHNIQUE: Multidetector CT imaging of the head and cervical spine was performed following the standard protocol without intravenous contrast. Multiplanar CT image reconstructions of the cervical spine were also generated.  COMPARISON:  MR brain 07/2011.  CT head 08/19/2011.  FINDINGS: CT HEAD FINDINGS  Premature for age cerebral and cerebellar atrophy. Chronic microvascular ischemic change affects the periventricular and subcortical white matter. No evidence for acute infarction, hemorrhage, mass lesion, hydrocephalus, or extra-axial fluid. Calvarium intact. Chronic left maxillary sinus disease. Negative orbits.  CT CERVICAL SPINE FINDINGS  There is no visible cervical spine fracture, traumatic subluxation, prevertebral soft tissue swelling, or intraspinal hematoma. Intervertebral disc spaces are preserved. Limbus vertebra C5-7. No lung apex lesion. No visible neck masses.  IMPRESSION: No acute intracranial findings. Atrophy with small vessel disease is stable from 2013.  No cervical spine fracture or traumatic subluxation.   Electronically Signed   By: Rolla Flatten M.D.   On: 05/13/2013 10:48     Assessment/Plan: 63 year old male with a long history of syncope.  Multiple work ups in the past have been unremarkable.  Patient complains of right sided weakness that is  negligible on examination at this time.  Head CT reviewed and shows no acute changes.  Patient continues to be a drinker.    Recommendation: 1.  Orthostatic BP with heart rate q shift 2.  MRI of the brain 3.  EEG 4. Telemetry  Alexis Goodell, MD [facility] [phone] 05/13/2013, 3:25 PM

## 2013-05-13 NOTE — H&P (Addendum)
[facility] History and Physical  CORYON BRUEGGER [id number] DOB: 02/16/1951 DOA: 05/13/2013  Referring physician: Dr. Alvino Chapel PCP: Philis Fendt, MD   Chief Complaint: Right-sided weakness/syncope  HPI: Tyler Patterson is a 63 y.o. male  With past medical history of diabetes, hypertension, polysubstance abuse, gastroesophageal reflux disease with chronic right-sided weakness and a prior history of syncope of unknown etiology. Patient states that 4 days prior to admission while walking through the hallway on the way to the bathroom his right leg suddenly gave out and he fell hitting his head on the wall with loss of consciousness. Patient stated that his wife heard him fall and came to pick him up and noted he had some slurred speech as well as unintelligible speech with some shaking on his right side. Patient stated that the day prior to admission had a similar episode in his right side is weaker than usual with some dragging on the right leg. Patient denies any fevers, no chills, no nausea, no vomiting, no chest pain, no change in shortness of breath, no diarrhea, no constipation, no dysuria. Patient does endorse a headache. No melena, no hematemesis, no hematochezia. Patient also complaining of right-sided pain radiating from his right shoulder to his right upper extremity. Patient was seen in the ED. Orthostatics which was done was negative. CT of the head and C-spine which were done was unremarkable. Patient was placed in a soft c-collar. Basic metabolic profile to have a glucose of 298 otherwise was within normal limits. LFTs are within normal limits. CBC within normal limits. Urinalysis which was done was unremarkable. Urine drug screen which was done was negative. Alcohol level was less than 11. Due to concern for trauma as well as syncope were called to admit the patient for further evaluation and management.   Review of Systems: As per history of present illness otherwise  negative. Constitutional:  No weight loss, night sweats, Fevers, chills, fatigue.  HEENT:  No headaches, Difficulty swallowing,Tooth/dental problems,Sore throat,  No sneezing, itching, ear ache, nasal congestion, post nasal drip,  Cardio-vascular:  No chest pain, Orthopnea, PND, swelling in lower extremities, anasarca, dizziness, palpitations  GI:  No heartburn, indigestion, abdominal pain, nausea, vomiting, diarrhea, change in bowel habits, loss of appetite  Resp:  No shortness of breath with exertion or at rest. No excess mucus, no productive cough, No non-productive cough, No coughing up of blood.No change in color of mucus.No wheezing.No chest wall deformity  Skin:  no rash or lesions.  GU:  no dysuria, change in color of urine, no urgency or frequency. No flank pain.  Musculoskeletal:  No joint pain or swelling. No decreased range of motion. No back pain.  Psych:  No change in mood or affect. No depression or anxiety. No memory loss.   Past Medical History  Diagnosis Date  . Diabetes mellitus   . Hypertension   . Shortness of breath   . GERD (gastroesophageal reflux disease)   . Headache(784.0)   . Headache 05/13/2013   Past Surgical History  Procedure Laterality Date  . Hernia repair     Social History:  reports that he has been smoking Cigars.  He has never used smokeless tobacco. He reports that he drinks alcohol. He reports that he does not use illicit drugs.  Allergies  Allergen Reactions  . Other     Unknown medication that he is allergic to. Starts with an [place]    History reviewed. No pertinent family history.   Prior to Admission  medications   Medication Sig Start Date End Date Taking? Authorizing Provider  carvedilol (COREG) 12.5 MG tablet Take 12.5 mg by mouth 2 (two) times daily with a meal.   Yes Historical Provider, MD  gabapentin (NEURONTIN) 100 MG capsule Take 100 mg by mouth 3 (three) times daily.   Yes Historical Provider, MD   hydrochlorothiazide (HYDRODIURIL) 25 MG tablet Take 25 mg by mouth daily.   Yes Historical Provider, MD  insulin aspart (NOVOLOG) 100 UNIT/ML injection Inject 8 Units into the skin 2 (two) times daily.   Yes Historical Provider, MD  insulin glargine (LANTUS) 100 UNIT/ML injection Inject 30 Units into the skin at bedtime. 08/21/11  Yes Monika Salk, MD  lisinopril (PRINIVIL,ZESTRIL) 40 MG tablet Take 40 mg by mouth daily.   Yes Historical Provider, MD  metFORMIN (GLUCOPHAGE) 850 MG tablet Take 850 mg by mouth 3 (three) times daily.   Yes Historical Provider, MD   Physical Exam: Filed Vitals:   05/13/13 1433  BP: 177/93  Pulse: 66  Temp: 97.4 F (36.3 C)  Resp: 16    BP 177/93  Pulse 66  Temp(Src) 97.4 F (36.3 C) (Oral)  Resp 16  Ht 5\' 8"  (1.727 m)  Wt 70.7 kg (155 lb 13.8 oz)  BMI 23.70 kg/m2  SpO2 100%  General:  Appears calm and comfortable. In c-collar in no acute cardiopulmonary distress.collar Eyes: PERRLA, normal lids, irises & conjunctiva. EOMI. ENT: grossly normal hearing, lips & tongue. Oropharynx is clear, no lesions, no exudates Neck: no LAD, masses or thyromegaly Cardiovascular: RRR, no m/r/g. No LE edema. Telemetry: SR, no arrhythmias  Respiratory: CTA bilaterally, no w/r/r. Normal respiratory effort. Abdomen: soft, ntnd, positive bowel sounds. Skin: no rash or induration seen on limited exam Musculoskeletal: grossly normal tone BUE/BLE. 5 out of 5 left upper extremity and left lower extremity strength. 4/5 right upper extremity and right lower extremity strength.  Psychiatric: grossly normal mood and affect, speech fluent and appropriate Neurologic:  alert and oriented x3. Cranial nerves II through XII are grossly intact. No focal deficits. Is intact. Unable to elicit reflexes symmetrically and diffusely. Gait not tested secondary to safety.           Labs on Admission:  Basic Metabolic Panel:  Recent Labs Lab 05/13/13 0902 05/13/13 0936 05/13/13 1430   NA 135* 135*  --   K 4.3 4.2  --   CL 97 98  --   CO2 25  --   --   GLUCOSE 298* 302*  --   BUN 13 13  --   CREATININE 0.89 1.00 0.80  CALCIUM 8.9  --   --   MG  --   --  1.6   Liver Function Tests:  Recent Labs Lab 05/13/13 0902  AST 15  ALT 12  ALKPHOS 84  BILITOT 0.7  PROT 7.1  ALBUMIN 3.4*   No results found for this basename: LIPASE, AMYLASE,  in the last 168 hours No results found for this basename: AMMONIA,  in the last 168 hours CBC:  Recent Labs Lab 05/13/13 0902 05/13/13 0936 05/13/13 1430  WBC 3.5*  --  4.8  NEUTROABS 1.7  --   --   HGB 13.6 14.6 14.4  HCT 38.4* 43.0 39.3  MCV 95.0  --  94.0  PLT 178  --  186   Cardiac Enzymes:  Recent Labs Lab 05/13/13 0854  TROPONINI <0.30    BNP (last 3 results) No results found for this basename:  PROBNP,  in the last 8760 hours CBG: No results found for this basename: GLUCAP,  in the last 168 hours  Radiological Exams on Admission: Ct Head Wo Contrast  05/13/2013   CLINICAL DATA:  [place] Tuesday. Loss of consciousness at that time. Also fell today but did not hit head. Blurred vision. Chronic right-sided weakness. Neck pain.  EXAM: CT HEAD WITHOUT CONTRAST  CT CERVICAL SPINE WITHOUT CONTRAST  TECHNIQUE: Multidetector CT imaging of the head and cervical spine was performed following the standard protocol without intravenous contrast. Multiplanar CT image reconstructions of the cervical spine were also generated.  COMPARISON:  MR brain 07/2011.  CT head 08/19/2011.  FINDINGS: CT HEAD FINDINGS  Premature for age cerebral and cerebellar atrophy. Chronic microvascular ischemic change affects the periventricular and subcortical white matter. No evidence for acute infarction, hemorrhage, mass lesion, hydrocephalus, or extra-axial fluid. Calvarium intact. Chronic left maxillary sinus disease. Negative orbits.  CT CERVICAL SPINE FINDINGS  There is no visible cervical spine fracture, traumatic subluxation, prevertebral soft  tissue swelling, or intraspinal hematoma. Intervertebral disc spaces are preserved. Limbus vertebra C5-7. No lung apex lesion. No visible neck masses.  IMPRESSION: No acute intracranial findings. Atrophy with small vessel disease is stable from 2013.  No cervical spine fracture or traumatic subluxation.   Electronically Signed   By: Rolla Flatten M.D.   On: 05/13/2013 10:48   Ct Cervical Spine Wo Contrast  05/13/2013   CLINICAL DATA:  [place]. Loss of consciousness at that time. Also fell today but did not hit head. Blurred vision. Chronic right-sided weakness. Neck pain.  EXAM: CT HEAD WITHOUT CONTRAST  CT CERVICAL SPINE WITHOUT CONTRAST  TECHNIQUE: Multidetector CT imaging of the head and cervical spine was performed following the standard protocol without intravenous contrast. Multiplanar CT image reconstructions of the cervical spine were also generated.  COMPARISON:  MR brain 07/2011.  CT head 08/19/2011.  FINDINGS: CT HEAD FINDINGS  Premature for age cerebral and cerebellar atrophy. Chronic microvascular ischemic change affects the periventricular and subcortical white matter. No evidence for acute infarction, hemorrhage, mass lesion, hydrocephalus, or extra-axial fluid. Calvarium intact. Chronic left maxillary sinus disease. Negative orbits.  CT CERVICAL SPINE FINDINGS  There is no visible cervical spine fracture, traumatic subluxation, prevertebral soft tissue swelling, or intraspinal hematoma. Intervertebral disc spaces are preserved. Limbus vertebra C5-7. No lung apex lesion. No visible neck masses.  IMPRESSION: No acute intracranial findings. Atrophy with small vessel disease is stable from 2013.  No cervical spine fracture or traumatic subluxation.   Electronically Signed   By: Rolla Flatten M.D.   On: 05/13/2013 10:48    EKG: Not done   Assessment/Plan Principal Problem:   Syncope Active Problems:   DM (diabetes mellitus)   HTN (hypertension)   Fall   Right sided weakness   GERD  (gastroesophageal reflux disease)   Headache   #1 syncope/right-sided weakness Questionable etiology. CT of the head was negative. Patient has had extensive workup in 2013 which was unrevealing. Patient with complaints of worsening right-sided weakness. Will admit to telemetry. Will get MRI of the head to rule out any acute infarction. Will check carotid Dopplers. EEG. Monitor on telemetry. No signs or symptoms of infection. Neurology has been consulted per ED MD.  #2 diabetes mellitus Check a hemoglobin A1c. Place on a sliding scale insulin.  #3 hypertension Resume home regimen of Coreg, lisinopril. Hydralazine as needed.  #4 fall Questionable etiology. PT/OT. Due to concern for trauma per ED physician, Trauma called  to assess the patient.  #5 gastroesophageal reflux disease PPI.  #6 prophylaxis PPI for GI prophylaxis. Lovenox for DVT prophylaxis.   Code Status: Full Family Communication: Updated patient no family at bedside. Disposition Plan: Admit to telemetry  Time spent: 65 minutes  [facility] M.D. [facility] Pager [phone]

## 2013-05-14 ENCOUNTER — Inpatient Hospital Stay (HOSPITAL_COMMUNITY): Payer: Medicaid Other

## 2013-05-14 DIAGNOSIS — W19XXXA Unspecified fall, initial encounter: Secondary | ICD-10-CM

## 2013-05-14 DIAGNOSIS — R55 Syncope and collapse: Secondary | ICD-10-CM

## 2013-05-14 LAB — BASIC METABOLIC PANEL
BUN: 13 mg/dL (ref 6–23)
CO2: 22 meq/L (ref 19–32)
Calcium: 8.4 mg/dL (ref 8.4–10.5)
Chloride: 98 mEq/L (ref 96–112)
Creatinine, Ser: 0.91 mg/dL (ref 0.50–1.35)
GFR calc Af Amer: >90 mL/min (ref >90)
GFR calc non Af Amer: 89 mL/min — ABNORMAL LOW (ref >90)
GLUCOSE: 267 mg/dL — AB (ref 70–99)
POTASSIUM: 4 meq/L (ref 3.7–5.3)
Sodium: 134 mEq/L — ABNORMAL LOW (ref 137–147)

## 2013-05-14 LAB — CBC
HCT: 36.2 % — ABNORMAL LOW (ref 39.0–52.0)
HEMOGLOBIN: 12.8 g/dL — AB (ref 13.0–17.0)
MCH: 33.6 pg (ref 26.0–34.0)
MCHC: 35.4 g/dL (ref 30.0–36.0)
MCV: 95 fL (ref 78.0–100.0)
Platelets: 177 10*3/uL (ref 150–400)
RBC: 3.81 MIL/uL — AB (ref 4.22–5.81)
RDW: 13 % (ref 11.5–15.5)
WBC: 5.3 10*3/uL (ref 4.0–10.5)

## 2013-05-14 LAB — GLUCOSE, CAPILLARY
GLUCOSE-CAPILLARY: 240 mg/dL — AB (ref 70–99)
GLUCOSE-CAPILLARY: 245 mg/dL — AB (ref 70–99)
GLUCOSE-CAPILLARY: 246 mg/dL — AB (ref 70–99)
GLUCOSE-CAPILLARY: 245 mg/dL — AB (ref 70–99)
GLUCOSE-CAPILLARY: 283 mg/dL — AB (ref 70–99)

## 2013-05-14 NOTE — Progress Notes (Signed)
*  PRELIMINARY RESULTS* Vascular Ultrasound Carotid Duplex (Doppler) has been completed.  Preliminary findings: Bilateral:  1-39% ICA stenosis.  Vertebral artery flow is antegrade.      Landry Mellow, RDMS, RVT  05/14/2013, 10:31 AM

## 2013-05-14 NOTE — Progress Notes (Signed)
Subjective: Stable and alert. Afebrile.Sitting up in bed playing with his cell phone. Family members in room joking around with him.  Flexion-extension views of cervical spine showed no instability. Denies any chest or abdominal pain. Tolerating diet.  Objective: Vital signs in last 24 hours: Temp:  [97.4 F (36.3 C)-98.3 F (36.8 C)] 98.3 F (36.8 C) (02/15 0537) Pulse Rate:  [63-74] 74 (02/15 0543) Resp:  [16-18] 18 (02/15 0537) BP: (131-177)/(69-93) 131/69 mmHg (02/15 1100) SpO2:  [100 %] 100 % (02/15 0537) FiO2 (%):  [21 %] 21 % (02/14 1342) Weight:  [155 lb 13.8 oz (70.7 kg)-185 lb 4.8 oz (84.052 kg)] 185 lb 4.8 oz (84.052 kg) (02/15 0628) Last BM Date: 05/14/13  Intake/Output from previous day: 02/14 0701 - 02/15 0700 In: 1400 [P.O.:240; I.V.:1160] Out: 1150 [Urine:1150] Intake/Output this shift:    Exam: Neck: Good range of motion active and passive. Minimal posterior tenderness. No bruising or swelling Lungs: Clear to auscultation Bilaterally. No chest wall tenderness. Abdomen: Soft and nontender Neurological: Alert and oriented. Speech normal. Moves all 4 extremities to command. Strength is equal bilaterally  Lab Results:   Recent Labs  05/13/13 1430 05/14/13 0408  WBC 4.8 5.3  HGB 14.4 12.8*  HCT 39.3 36.2*  PLT 186 177   BMET  Recent Labs  05/13/13 0902 05/13/13 0936 05/13/13 1430 05/14/13 0408  NA 135* 135*  --  134*  K 4.3 4.2  --  4.0  CL 97 98  --  98  CO2 25  --   --  22  GLUCOSE 298* 302*  --  267*  BUN 13 13  --  13  CREATININE 0.89 1.00 0.80 0.91  CALCIUM 8.9  --   --  8.4   PT/INR  Recent Labs  05/13/13 0902  LABPROT 12.1  INR 0.91   ABG No results found for this basename: PHART, PCO2, PO2, HCO3,  in the last 72 hours  Studies/Results: Ct Head Wo Contrast  05/13/2013   CLINICAL DATA:  [place] Tuesday. Loss of consciousness at that time. Also fell today but did not hit head. Blurred vision. Chronic right-sided weakness.  Neck pain.  EXAM: CT HEAD WITHOUT CONTRAST  CT CERVICAL SPINE WITHOUT CONTRAST  TECHNIQUE: Multidetector CT imaging of the head and cervical spine was performed following the standard protocol without intravenous contrast. Multiplanar CT image reconstructions of the cervical spine were also generated.  COMPARISON:  MR brain 07/2011.  CT head 08/19/2011.  FINDINGS: CT HEAD FINDINGS  Premature for age cerebral and cerebellar atrophy. Chronic microvascular ischemic change affects the periventricular and subcortical white matter. No evidence for acute infarction, hemorrhage, mass lesion, hydrocephalus, or extra-axial fluid. Calvarium intact. Chronic left maxillary sinus disease. Negative orbits.  CT CERVICAL SPINE FINDINGS  There is no visible cervical spine fracture, traumatic subluxation, prevertebral soft tissue swelling, or intraspinal hematoma. Intervertebral disc spaces are preserved. Limbus vertebra C5-7. No lung apex lesion. No visible neck masses.  IMPRESSION: No acute intracranial findings. Atrophy with small vessel disease is stable from 2013.  No cervical spine fracture or traumatic subluxation.   Electronically Signed   By: Rolla Flatten M.D.   On: 05/13/2013 10:48   Ct Cervical Spine Wo Contrast  05/13/2013   CLINICAL DATA:  [place] Tuesday. Loss of consciousness at that time. Also fell today but did not hit head. Blurred vision. Chronic right-sided weakness. Neck pain.  EXAM: CT HEAD WITHOUT CONTRAST  CT CERVICAL SPINE WITHOUT CONTRAST  TECHNIQUE: Multidetector CT imaging of  the head and cervical spine was performed following the standard protocol without intravenous contrast. Multiplanar CT image reconstructions of the cervical spine were also generated.  COMPARISON:  MR brain 07/2011.  CT head 08/19/2011.  FINDINGS: CT HEAD FINDINGS  Premature for age cerebral and cerebellar atrophy. Chronic microvascular ischemic change affects the periventricular and subcortical white matter. No evidence for acute  infarction, hemorrhage, mass lesion, hydrocephalus, or extra-axial fluid. Calvarium intact. Chronic left maxillary sinus disease. Negative orbits.  CT CERVICAL SPINE FINDINGS  There is no visible cervical spine fracture, traumatic subluxation, prevertebral soft tissue swelling, or intraspinal hematoma. Intervertebral disc spaces are preserved. Limbus vertebra C5-7. No lung apex lesion. No visible neck masses.  IMPRESSION: No acute intracranial findings. Atrophy with small vessel disease is stable from 2013.  No cervical spine fracture or traumatic subluxation.   Electronically Signed   By: Rolla Flatten M.D.   On: 05/13/2013 10:48   Dg Cerv Spine Flex&ext Only  05/14/2013   CLINICAL DATA:  Post recurrent fall with persistent neck pain  EXAM: CERVICAL SPINE - FLEXION AND EXTENSION VIEWS ONLY  COMPARISON:  CT C SPINE W/O CM dated 05/13/2013  FINDINGS: C1 to the superior endplate of C7 is visualized on all provided radiographs. There is obscuration of the cervical thoracic junction secondary to overlying osseous soft tissue structures.  Normal alignment of the cervical spine. No anterolisthesis or retrolisthesis. Normal alignment is maintained given the acquired degrees of flexion and extension.  Cervical vertebral body heights are preserved. Prevertebral soft tissues are normal.  There is mild multilevel cervical spine DDD, worse at C4-C5, C5-C6 and C6-C7 with disc space height loss, endplate irregularity and small posteriorly directed disc osteophyte complexes at these locations.  Regional soft tissues appear normal.  IMPRESSION: 1. No evidence of dynamic instability of the cervical spine given the acquired degrees of flexion and extension. 2. Mild multilevel cervical spine DDD.   Electronically Signed   By: Sandi Mariscal M.D.   On: 05/14/2013 08:31    Anti-infectives: Anti-infectives   None      Assessment/Plan:   Assessment/plan: Ground level fall and questionable syncopal episode. Defer evaluation and  treatment plan medical service. Neck pain. Resolving. No evidence of cervical spine injury. Cervical spine cleared. Collar removed Diabetes mellitus Hypertension Alcohol abuse  The trauma service will sign off. Please reconsult if trauma or surgical problems arise.   LOS: 1 day    Delene Morais M. Dalbert Batman, M.D., [facility] General and Minimally invasive Surgery Breast and Colorectal Surgery Trauma Office:   [phone]   05/14/2013

## 2013-05-14 NOTE — Progress Notes (Signed)
Inpatient Diabetes Program Recommendations  AACE/ADA: New Consensus Statement on Inpatient Glycemic Control (2013)  Target Ranges:  Prepandial:   less than 140 mg/dL      Peak postprandial:   less than 180 mg/dL (1-2 hours)      Critically ill patients:  140 - 180 mg/dL   Results for Tyler Patterson, NIM (MRN [id number]) as of 05/14/2013 13:01  Ref. Range 05/13/2013 16:24 05/13/2013 20:46 05/13/2013 21:27 05/14/2013 05:48 05/14/2013 07:47 05/14/2013 11:42  Glucose-Capillary Latest Range: 70-99 mg/dL 353 (H) 52 (L) 81 240 (H) 245 (H) 246 (H)    Diabetes history: DM2 Outpatient Diabetes medications: Lantus 30 units QHS, Novolog 8 units BID (with breakfast and supper,usually only eats two meals a day), and Metformin 850 mg TID Current orders for Inpatient glycemic control: Lantus 30 units QHS, Novolog 0-15 nits AC  Inpatient Diabetes Program Recommendations Insulin - Basal: Noted Lantus 30 units QHS was not given last night due to CBG of 52 mg/dl at 20:46 last night. Insulin - Meal Coverage: Please consider ordering Novolog 4 units TID with meals for meal coverage (as long as patient eats at least 50% of meal).  Note: Spoke with patient about diabetes and home regimen for diabetes control.  Patient reports that he takes Lantus 30 units QHS, Novolog 8 units BID (with breakfast and supper,usually only eats two meals a day), and Metformin 850 mg TID as an outpatient for diabetes control.  Inquired about compliance of medications and patient reports that he takes all of his diabetes medications as prescribed.  Patient states that he check his blood sugar at least twice a day and it generally runs 250-400's mg/dl.  Stressed importance of checking CBGs and maintaining good CBG control to prevent long-term and short-term complications related to uncontrolled diabetes. Patient verbalized understanding of information discussed and reports that he does not have any further questions related to his diabetes at this time.    Noted patient had hypoglycemic episode yesterday evening with CBG of 52 mg/dl at 20:46 (approximately 3 hours after receiving Novolog 15 units for CBG of 353 mg/dl at 17:36).  Patient reports that he did indeed feel low and reports feeling shaky and sweaty last night with CBG of 52 mg/dl.  Patient states that he rarely has low blood sugars at home but when he does he reports feeling low once his blood sugar gets to 70 mg/dl or less.  Due to being hypoglycemic near bedtime last night, Lantus 30 units QHS was not given last night.  Do not recommend changing Lantus dose at this time.  Please consider ordering Novolog 4 units TID with meals.  Will continue to follow.  Thanks, Barnie Alderman, RN, MSN, CCRN Diabetes Coordinator Inpatient Diabetes Program [phone] (Team Pager) [phone] (AP office) [phone] [facility] office)

## 2013-05-14 NOTE — Progress Notes (Signed)
[facility] PROGRESS NOTE  Tyler Patterson [id number] DOB: 08-May-1950 DOA: 05/13/2013 PCP: Philis Fendt, MD Brief HPI: Tyler Patterson is a 63 y.o. male  With past medical history of diabetes, hypertension, polysubstance abuse, gastroesophageal reflux disease with chronic right-sided weakness and a prior history of syncope of unknown etiology, comes in for syncope and fall hitting his neck. h e also had some slurred speech as per the wife. Orthostatics in ED, done was negative. CT of the head and C-spine which were done was unremarkable. Patient was placed in a soft c-collar. TRAUMA and neurology consulted and he was admitted for evaluation of stroke to the hospitalist service.    Assessment/Plan: #1 syncope/right-sided weakness  Questionable etiology. CT of the head was negative. Patient has had extensive workup in 2013 which was unrevealing. Patient with complaints of worsening right-sided weakness. Will get MRI of the head to rule out any acute infarction. Will check carotid Dopplers. EEG. Monitor on telemetry. No signs or symptoms of infection. Neurology has been consulted per ED MD.  #2 diabetes mellitus  Check a hemoglobin A1c. Place on a sliding scale insulin.  #3 hypertension  Resume home regimen of Coreg, lisinopril. Hydralazine as needed.  #4 fall  Questionable etiology. PT/OT. Due to concern for trauma per ED physician, Trauma called to assess the patient. No further recommendations.  #5 gastroesophageal reflux disease  PPI.  #6 prophylaxis  PPI for GI prophylaxis. Lovenox for DVT prophylaxis.      Code Status: full code.  Family Communication: discussed in detail witht he wife at bedside.  Disposition Plan: pending PT eval   Consultants:  Neurology  Trauma surgery.  Procedures:  MRI brain  Antibiotics:  none  HPI/Subjective: Reports clumsiness and weakness of the right hand. No other complailnts.  Objective: Filed Vitals:   05/14/13 1339  BP:  133/68  Pulse: 72  Temp: 98.6 F (37 C)  Resp: 20    Intake/Output Summary (Last 24 hours) at 05/14/13 1606 Last data filed at 05/14/13 1300  Gross per 24 hour  Intake   1760 ml  Output   1150 ml  Net    610 ml   Filed Weights   05/13/13 0831 05/13/13 1433 05/14/13 0628  Weight: 80.74 kg (178 lb) 70.7 kg (155 lb 13.8 oz) 84.052 kg (185 lb 4.8 oz)    Exam:   General:  Alert afebrile comfortable  Cardiovascular: s1s2  Respiratory: ctab  Abdomen: soft NT ND BS+  Musculoskeletal: no pedal edema.   Neuro: grossly normal.     Data Reviewed: Basic Metabolic Panel:  Recent Labs Lab 05/13/13 0902 05/13/13 0936 05/13/13 1430 05/14/13 0408  NA 135* 135*  --  134*  K 4.3 4.2  --  4.0  CL 97 98  --  98  CO2 25  --   --  22  GLUCOSE 298* 302*  --  267*  BUN 13 13  --  13  CREATININE 0.89 1.00 0.80 0.91  CALCIUM 8.9  --   --  8.4  MG  --   --  1.6  --    Liver Function Tests:  Recent Labs Lab 05/13/13 0902  AST 15  ALT 12  ALKPHOS 84  BILITOT 0.7  PROT 7.1  ALBUMIN 3.4*   No results found for this basename: LIPASE, AMYLASE,  in the last 168 hours No results found for this basename: AMMONIA,  in the last 168 hours CBC:  Recent Labs Lab 05/13/13 0902 05/13/13 0936  05/13/13 1430 05/14/13 0408  WBC 3.5*  --  4.8 5.3  NEUTROABS 1.7  --   --   --   HGB 13.6 14.6 14.4 12.8*  HCT 38.4* 43.0 39.3 36.2*  MCV 95.0  --  94.0 95.0  PLT 178  --  186 177   Cardiac Enzymes:  Recent Labs Lab 05/13/13 0854  TROPONINI <0.30   BNP (last 3 results) No results found for this basename: PROBNP,  in the last 8760 hours CBG:  Recent Labs Lab 05/13/13 2046 05/13/13 2127 05/14/13 0548 05/14/13 0747 05/14/13 1142  GLUCAP 52* 81 240* 245* 246*    No results found for this or any previous visit (from the past 240 hour(s)).   Studies: Ct Head Wo Contrast  05/13/2013   CLINICAL DATA:  [place] Tuesday. Loss of consciousness at that time. Also fell today but  did not hit head. Blurred vision. Chronic right-sided weakness. Neck pain.  EXAM: CT HEAD WITHOUT CONTRAST  CT CERVICAL SPINE WITHOUT CONTRAST  TECHNIQUE: Multidetector CT imaging of the head and cervical spine was performed following the standard protocol without intravenous contrast. Multiplanar CT image reconstructions of the cervical spine were also generated.  COMPARISON:  MR brain 07/2011.  CT head 08/19/2011.  FINDINGS: CT HEAD FINDINGS  Premature for age cerebral and cerebellar atrophy. Chronic microvascular ischemic change affects the periventricular and subcortical white matter. No evidence for acute infarction, hemorrhage, mass lesion, hydrocephalus, or extra-axial fluid. Calvarium intact. Chronic left maxillary sinus disease. Negative orbits.  CT CERVICAL SPINE FINDINGS  There is no visible cervical spine fracture, traumatic subluxation, prevertebral soft tissue swelling, or intraspinal hematoma. Intervertebral disc spaces are preserved. Limbus vertebra C5-7. No lung apex lesion. No visible neck masses.  IMPRESSION: No acute intracranial findings. Atrophy with small vessel disease is stable from 2013.  No cervical spine fracture or traumatic subluxation.   Electronically Signed   By: Rolla Flatten M.D.   On: 05/13/2013 10:48   Ct Cervical Spine Wo Contrast  05/13/2013   CLINICAL DATA:  [place]. Loss of consciousness at that time. Also fell today but did not hit head. Blurred vision. Chronic right-sided weakness. Neck pain.  EXAM: CT HEAD WITHOUT CONTRAST  CT CERVICAL SPINE WITHOUT CONTRAST  TECHNIQUE: Multidetector CT imaging of the head and cervical spine was performed following the standard protocol without intravenous contrast. Multiplanar CT image reconstructions of the cervical spine were also generated.  COMPARISON:  MR brain 07/2011.  CT head 08/19/2011.  FINDINGS: CT HEAD FINDINGS  Premature for age cerebral and cerebellar atrophy. Chronic microvascular ischemic change affects the  periventricular and subcortical white matter. No evidence for acute infarction, hemorrhage, mass lesion, hydrocephalus, or extra-axial fluid. Calvarium intact. Chronic left maxillary sinus disease. Negative orbits.  CT CERVICAL SPINE FINDINGS  There is no visible cervical spine fracture, traumatic subluxation, prevertebral soft tissue swelling, or intraspinal hematoma. Intervertebral disc spaces are preserved. Limbus vertebra C5-7. No lung apex lesion. No visible neck masses.  IMPRESSION: No acute intracranial findings. Atrophy with small vessel disease is stable from 2013.  No cervical spine fracture or traumatic subluxation.   Electronically Signed   By: Rolla Flatten M.D.   On: 05/13/2013 10:48   Dg Cerv Spine Flex&ext Only  05/14/2013   CLINICAL DATA:  Post recurrent fall with persistent neck pain  EXAM: CERVICAL SPINE - FLEXION AND EXTENSION VIEWS ONLY  COMPARISON:  CT C SPINE W/O CM dated 05/13/2013  FINDINGS: C1 to the superior endplate of C7 is  visualized on all provided radiographs. There is obscuration of the cervical thoracic junction secondary to overlying osseous soft tissue structures.  Normal alignment of the cervical spine. No anterolisthesis or retrolisthesis. Normal alignment is maintained given the acquired degrees of flexion and extension.  Cervical vertebral body heights are preserved. Prevertebral soft tissues are normal.  There is mild multilevel cervical spine DDD, worse at C4-C5, C5-C6 and C6-C7 with disc space height loss, endplate irregularity and small posteriorly directed disc osteophyte complexes at these locations.  Regional soft tissues appear normal.  IMPRESSION: 1. No evidence of dynamic instability of the cervical spine given the acquired degrees of flexion and extension. 2. Mild multilevel cervical spine DDD.   Electronically Signed   By: Sandi Mariscal M.D.   On: 05/14/2013 08:31    Scheduled Meds: . carvedilol  12.5 mg Oral BID WC  . docusate sodium  100 mg Oral BID  .  enoxaparin (LOVENOX) injection  40 mg Subcutaneous Q24H  . folic acid  1 mg Oral Daily  . gabapentin  100 mg Oral TID  . insulin aspart  0-15 Units Subcutaneous TID WC  . insulin glargine  30 Units Subcutaneous QHS  . lisinopril  40 mg Oral Daily  . pantoprazole  40 mg Oral Q0600  . sodium chloride  3 mL Intravenous Q12H  . thiamine  100 mg Oral Daily   Continuous Infusions:   Principal Problem:   Syncope Active Problems:   DM (diabetes mellitus)   HTN (hypertension)   Fall   Right sided weakness   GERD (gastroesophageal reflux disease)   Headache    Time spent: 28  min    Advika Mclelland  [facility] Pager [phone]. If 7PM-7AM, please contact night-coverage at [email or web address], password [email or web address] 05/14/2013, 4:06 PM  LOS: 1 day

## 2013-05-14 NOTE — Evaluation (Signed)
Occupational Therapy Evaluation Patient Details Name: Tyler Patterson MRN: [id number] DOB: [birth year] Today's Date: 05/14/2013 Time: [id number] OT Time Calculation (min): 23 min  OT Assessment / Plan / Recommendation History of present illness Tyler Patterson is an 63 y.o. male that has had falls over the past 6-7 years.  He reports on Tuesday falling in the hallway and hitting his head and again this morning falling on his head and right shoulder.  He reports LOC with these episodes. He reports weakness to his right leg and arm for the last 3 years, but admits it has worsened "since the last fall." He states my "leg just gives out" and has to "drag it" often.     Clinical Impression   Pt presents with below problem list. Pt independent with ADLs, PTA. Feel pt will benefit from acute OT to increase independence prior to d/c.     OT Assessment  Patient needs continued OT Services    Follow Up Recommendations  Supervision/Assistance - 24 hour;Home health OT    Barriers to Discharge      Equipment Recommendations  3 in 1 bedside comode    Recommendations for Other Services    Frequency  Min 2X/week    Precautions / Restrictions Precautions Precautions: Fall   Pertinent Vitals/Pain Pain in RUE, but not rated.     ADL  Upper Body Dressing: Set up Where Assessed - Upper Body Dressing: Unsupported sitting Lower Body Dressing: Min guard Where Assessed - Lower Body Dressing: Supported sit to stand Toilet Transfer: Modified independent Armed forces technical officer Method: Sit to Loss adjuster, chartered: Regular height toilet Tub/Shower Transfer: Nurse, learning disability Method: Therapist, art: Other (comment) (practiced stepping over) Equipment Used: Gait belt;Cane Transfers/Ambulation Related to ADLs: Min guard ADL Comments: Pt running into objects on left when in hallway. Told pt to look to left side. Recommended sitting for bathing and dressing. Recommended  wife being with pt getting in and out of shower. Recommended standing in front of bed/chair when pulling up LB clothing.  Educated on fall risk reduction techniques. Discussed dressing technique and use of button ups may be easier for him. Told pt to be using right hand during activities. Told pt to take time with ambulation.    OT Diagnosis: Acute pain;Other (comment) (decreased balance)  OT Problem List: Decreased strength;Decreased coordination;Impaired balance (sitting and/or standing);Decreased knowledge of use of DME or AE;Pain OT Treatment Interventions: Self-care/ADL training;Therapeutic exercise;DME and/or AE instruction;Therapeutic activities;Patient/family education;Balance training   OT Goals(Current goals can be found in the care plan section) Acute Rehab OT Goals Patient Stated Goal: not stated OT Goal Formulation: With patient Time For Goal Achievement: 05/21/13 Potential to Achieve Goals: Good ADL Goals Pt Will Perform Lower Body Bathing: with modified independence;sit to/from stand Pt Will Perform Lower Body Dressing: with modified independence;sit to/from stand Additional ADL Goal #1: Pt will be independent with HEP for RUE to increase strength and coordination.   Visit Information  Last OT Received On: 05/14/13 Assistance Needed: +1 History of Present Illness: Tyler Patterson is an 63 y.o. male that has had falls over the past 6-7 years.  He reports on Tuesday falling in the hallway and hitting his head and again this morning falling on his head and right shoulder.  He reports LOC with these episodes. He reports weakness to his right leg and arm for the last 3 years, but admits it has worsened "since the last fall." He states my "leg just gives out"  and has to "drag it" often.         Prior [place] expects to be discharged to:: Private residence Living Arrangements: Spouse/significant other Available Help at Discharge: Family Type of  Home: Apartment Home Access: Level entry Home Layout: One Wise: Environmental consultant - 2 wheels;Cane - single point Prior Function Level of Independence: Independent with assistive device(s) Comments: reports 8 falls in 6 months Communication Communication: No difficulties Dominant Hand: Right         Vision/Perception Vision - History Baseline Vision: Wears glasses all the time Patient Visual Report: Blurring of vision (at times) Vision - Assessment Vision Assessment: Vision tested Tracking/Visual Pursuits: Able to track stimulus in all quads without difficulty Visual Fields: No apparent deficits   Cognition  Cognition Arousal/Alertness: Awake/alert Behavior During Therapy: WFL for tasks assessed/performed Overall Cognitive Status: Within Functional Limits for tasks assessed    Extremity/Trunk Assessment Upper Extremity Assessment Upper Extremity Assessment: RUE deficits/detail RUE Deficits / Details: AROM shoulder flexion approximately 90 degrees; grip strength decreased compared to left. Pain in RUE RUE Coordination: decreased fine motor Lower Extremity Assessment Lower Extremity Assessment: Defer to PT evaluation     Mobility Transfers Overall transfer level: Modified independent     Exercise     Balance Balance Overall balance assessment: History of Falls   End of Session OT - End of Session Equipment Utilized During Treatment: Gait belt;Other (comment) (cane) Activity Tolerance: Patient tolerated treatment well Patient left: in bed;with call bell/phone within reach Nurse Communication: Other (comment) (saw him ambulating)  GO     Benito Mccreedy OTR/L [id number] 05/14/2013, 6:19 PM

## 2013-05-15 ENCOUNTER — Inpatient Hospital Stay (HOSPITAL_COMMUNITY): Payer: Medicaid Other

## 2013-05-15 DIAGNOSIS — R51 Headache: Secondary | ICD-10-CM

## 2013-05-15 DIAGNOSIS — K219 Gastro-esophageal reflux disease without esophagitis: Secondary | ICD-10-CM

## 2013-05-15 LAB — GLUCOSE, CAPILLARY
Glucose-Capillary: 141 mg/dL — ABNORMAL HIGH (ref 70–99)
Glucose-Capillary: 95 mg/dL (ref 70–99)

## 2013-05-15 MED ORDER — PANTOPRAZOLE SODIUM 40 MG PO TBEC: 40.0000 mg | DELAYED_RELEASE_TABLET | Freq: Every day | ORAL | Status: DC

## 2013-05-15 MED ORDER — DSS 100 MG PO CAPS: 100.0000 mg | ORAL_CAPSULE | Freq: Two times a day (BID) | ORAL | Status: DC | PRN

## 2013-05-15 MED ORDER — CYCLOBENZAPRINE HCL 10 MG PO TABS: 10.0000 mg | ORAL_TABLET | Freq: Two times a day (BID) | ORAL | Status: DC | PRN

## 2013-05-15 MED ORDER — OXYCODONE HCL 5 MG PO TABS: 5.0000 mg | ORAL_TABLET | Freq: Four times a day (QID) | ORAL | Status: DC | PRN

## 2013-05-15 NOTE — Progress Notes (Signed)
Subjective: Patient has had no further syncopal episodes while hospitalized.  No new complaints.  Objective: Current vital signs: BP 155/67  Pulse 60  Temp(Src) 98 F (36.7 C) (Oral)  Resp 18  Ht 5\' 8"  (1.727 m)  Wt 82.5 kg (181 lb 14.1 oz)  BMI 27.66 kg/m2  SpO2 100% Vital signs in last 24 hours: Temp:  [97.6 F (36.4 C)-98.6 F (37 C)] 98 F (36.7 C) (02/16 0601) Pulse Rate:  [59-72] 60 (02/16 0601) Resp:  [18-20] 18 (02/16 0601) BP: (131-155)/(66-68) 155/67 mmHg (02/16 0601) SpO2:  [100 %] 100 % (02/16 0601) Weight:  [82.5 kg (181 lb 14.1 oz)] 82.5 kg (181 lb 14.1 oz) (02/16 0500)  Intake/Output from previous day: 02/15 0701 - 02/16 0700 In: 360 [P.O.:360] Out: -  Intake/Output this shift:   Nutritional status: Carb Control  Neurologic Exam: Mental Status:  Alert, oriented, thought content appropriate. Speech fluent without evidence of aphasia. Able to follow 3 step commands without difficulty.  Cranial Nerves:  II: Discs flat bilaterally; Visual fields grossly normal, pupils equal, round, reactive to light and accommodation  III,IV, VI: ptosis not present, extra-ocular motions intact bilaterally  V,VII: smile symmetric, facial light touch sensation normal bilaterally  VIII: hearing normal bilaterally  IX,X: gag reflex present  XI: bilateral shoulder shrug  XII: midline tongue extension  Motor:  Right : Upper extremity 5-/5         Left: Upper extremity 5/5   Lower extremity 5/5      Lower extremity 5/5  Tone and bulk:normal tone throughout; no atrophy noted  Sensory: Pinprick and light touch intact throughout, bilaterally  Deep Tendon Reflexes: 2+ and symmetric throughout  Plantars:  Right: mute    Left: mute  Cerebellar:  normal finger-to-nose and normal heel-to-shin test   Lab Results: Basic Metabolic Panel:  Recent Labs Lab 05/13/13 0902 05/13/13 0936 05/13/13 1430 05/14/13 0408  NA 135* 135*  --  134*  K 4.3 4.2  --  4.0  CL 97 98  --  98   CO2 25  --   --  22  GLUCOSE 298* 302*  --  267*  BUN 13 13  --  13  CREATININE 0.89 1.00 0.80 0.91  CALCIUM 8.9  --   --  8.4  MG  --   --  1.6  --     Liver Function Tests:  Recent Labs Lab 05/13/13 0902  AST 15  ALT 12  ALKPHOS 84  BILITOT 0.7  PROT 7.1  ALBUMIN 3.4*   No results found for this basename: LIPASE, AMYLASE,  in the last 168 hours No results found for this basename: AMMONIA,  in the last 168 hours  CBC:  Recent Labs Lab 05/13/13 0902 05/13/13 0936 05/13/13 1430 05/14/13 0408  WBC 3.5*  --  4.8 5.3  NEUTROABS 1.7  --   --   --   HGB 13.6 14.6 14.4 12.8*  HCT 38.4* 43.0 39.3 36.2*  MCV 95.0  --  94.0 95.0  PLT 178  --  186 177    Cardiac Enzymes:  Recent Labs Lab 05/13/13 0854  TROPONINI <0.30    Lipid Panel: No results found for this basename: CHOL, TRIG, HDL, CHOLHDL, VLDL, LDLCALC,  in the last 168 hours  CBG:  Recent Labs Lab 05/14/13 1142 05/14/13 1704 05/14/13 2221 05/15/13 0744 05/15/13 1129  GLUCAP 246* 245* 283* 141* 95    Microbiology: Results for orders placed during the hospital encounter of 03/25/10  URINE CULTURE     Status: None   Collection Time    03/25/10 11:38 AM      Result Value Ref Range Status   Specimen Description URINE, RANDOM   Final   Special Requests CX ADDED ON 03/25/10 @ 1700   Final   Culture  Setup Time [id number]   Final   Colony Count NO GROWTH   Final   Culture NO GROWTH   Final   Report Status 03/26/2010 FINAL   Final  CULTURE, BLOOD (ROUTINE X 2)     Status: None   Collection Time    03/25/10  3:08 PM      Result Value Ref Range Status   Specimen Description BLOOD LEFT HAND   Final   Special Requests     Final   Value: BOTTLES DRAWN AEROBIC AND ANAEROBIC 10CC AER 5CC ANA   Culture  Setup Time [id number]   Final   Culture NO GROWTH 5 DAYS   Final   Report Status 03/31/2010 FINAL   Final  CULTURE, BLOOD (ROUTINE X 2)     Status: None   Collection Time    03/25/10  3:15 PM       Result Value Ref Range Status   Specimen Description BLOOD RIGHT HAND   Final   Special Requests BOTTLES DRAWN AEROBIC ONLY 5CC   Final   Culture  Setup Time [id number]   Final   Culture NO GROWTH 5 DAYS   Final   Report Status 03/31/2010 FINAL   Final  MRSA PCR SCREENING     Status: None   Collection Time    03/25/10  4:35 PM      Result Value Ref Range Status   MRSA by PCR    NEGATIVE Final   Value: NEGATIVE            The GeneXpert MRSA Assay (FDA     approved for NASAL specimens     only), is one component of a     comprehensive MRSA colonization     surveillance program. It is not     intended to diagnose MRSA     infection nor to guide or     monitor treatment for     MRSA infections.    Coagulation Studies:  Recent Labs  05/13/13 0902  LABPROT 12.1  INR 0.91    Imaging: Dg Thoracic Spine 4v  05/15/2013   CLINICAL DATA:  Right arm weakness, right leg weakness, falls  EXAM: THORACIC SPINE - 4+ VIEW  COMPARISON:  AP chest radiograph 08/19/2011  FINDINGS: Twelve pairs of ribs.  Vertebral body and disc space heights maintained.  No acute fracture, subluxation or bone destruction.  Tiny endplate spurs at lower thoracic spine.  Visualized posterior ribs intact.  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  No acute abnormalities.   Electronically Signed   By: Lavonia Dana M.D.   On: 05/15/2013 11:01   Dg Lumbar Spine Complete  05/15/2013   CLINICAL DATA:  Pain with right-sided weakness  EXAM: LUMBAR SPINE - COMPLETE 4+ VIEW  COMPARISON:  None.  FINDINGS: Frontal lateral, spot lumbosacral lateral, and bilateral oblique views were obtained. There are 4 non-rib-bearing lumbar type vertebral bodies. There is no fracture or spondylolisthesis. Disc spaces appear intact. There is no appreciable facet arthropathy.  IMPRESSION: No fracture or appreciable arthropathy.   Electronically Signed   By: Lowella Grip M.D.   On: 05/15/2013 11:08   Mr  Brain Wo Contrast  05/14/2013   CLINICAL DATA:  Chronic right-sided weakness. Syncope with fall. Stroke risk factors include diabetes, and hypertension.  EXAM: MRI HEAD WITHOUT CONTRAST  TECHNIQUE: Multiplanar, multiecho pulse sequences of the brain and surrounding structures were obtained without intravenous contrast.  COMPARISON:  DG CERV SPINE FLEX&EXT ONLY dated 05/14/2013; MR HEAD W/O CM dated 08/20/2011; CT HEAD W/O CM dated 05/13/2013; CT C SPINE W/O CM dated 05/13/2013  FINDINGS: No evidence for acute infarction, hemorrhage, mass lesion, hydrocephalus, or extra-axial fluid. Moderate premature for age cerebral and cerebellar atrophy. Moderately extensive chronic microvascular ischemic change affects the periventricular and subcortical white matter. No midline abnormalities. Flow voids are preserved. Negative orbits, sinuses, and mastoids. No osseous findings. Similar appearance to priors.  IMPRESSION: Atrophy and small vessel disease.  No acute intracranial findings.   Electronically Signed   By: Rolla Flatten M.D.   On: 05/14/2013 19:45   Dg Cerv Spine Flex&ext Only  05/14/2013   CLINICAL DATA:  Post recurrent fall with persistent neck pain  EXAM: CERVICAL SPINE - FLEXION AND EXTENSION VIEWS ONLY  COMPARISON:  CT C SPINE W/O CM dated 05/13/2013  FINDINGS: C1 to the superior endplate of C7 is visualized on all provided radiographs. There is obscuration of the cervical thoracic junction secondary to overlying osseous soft tissue structures.  Normal alignment of the cervical spine. No anterolisthesis or retrolisthesis. Normal alignment is maintained given the acquired degrees of flexion and extension.  Cervical vertebral body heights are preserved. Prevertebral soft tissues are normal.  There is mild multilevel cervical spine DDD, worse at C4-C5, C5-C6 and C6-C7 with disc space height loss, endplate irregularity and small posteriorly directed disc osteophyte complexes at these locations.  Regional soft tissues  appear normal.  IMPRESSION: 1. No evidence of dynamic instability of the cervical spine given the acquired degrees of flexion and extension. 2. Mild multilevel cervical spine DDD.   Electronically Signed   By: Sandi Mariscal M.D.   On: 05/14/2013 08:31    Medications:  I have reviewed the patient's current medications. Scheduled: . carvedilol  12.5 mg Oral BID WC  . docusate sodium  100 mg Oral BID  . enoxaparin (LOVENOX) injection  40 mg Subcutaneous Q24H  . folic acid  1 mg Oral Daily  . gabapentin  100 mg Oral TID  . insulin aspart  0-15 Units Subcutaneous TID WC  . insulin glargine  30 Units Subcutaneous QHS  . lisinopril  40 mg Oral Daily  . pantoprazole  40 mg Oral Q0600  . sodium chloride  3 mL Intravenous Q12H  . thiamine  100 mg Oral Daily    Assessment/Plan: No further syncopal events.  Work up has been unremarkable.  MRI of the brain has been reviewed and shows no acute changes.  Orthostatic testing has been normal.  EEG is unremarkable as well.    Recommendations: 1.  No further neurologic intervention is recommended at this time.  If further questions arise, please call or page at that time.  Thank you for allowing neurology to participate in the care of this patient.  Patient may follow up as an outpatient.      LOS: 2 days   Alexis Goodell, MD [facility] [phone] 05/15/2013  1:34 PM

## 2013-05-15 NOTE — Progress Notes (Signed)
Routine adult EEG completed.

## 2013-05-15 NOTE — Discharge Summary (Signed)
Physician Discharge Summary  Patient ID: Tyler Patterson MRN: [id number] DOB/AGE: 07-29-1950 63 y.o.  Admit date: 05/13/2013 Discharge date: 05/15/2013  Primary Care Physician:  Philis Fendt, MD  Discharge Diagnoses:   . Syncope unclear etiology, likely vasovagal  . Fall . Right sided weakness . DM (diabetes mellitus) . HTN (hypertension) . GERD (gastroesophageal reflux disease) . Headache  Consults: Neurology, Dr. Doy Mince                     Trauma service, Dr. Redmond Pulling   Recommendations for Outpatient Follow-up:  Patient was recommended to followup with his PCP and possible outpatient workup of his chronic back pain  Allergies:   Allergies  Allergen Reactions  . Other     Unknown medication that he is allergic to. Starts with an [place]     Discharge Medications:   Medication List    STOP taking these medications       hydrochlorothiazide 25 MG tablet  Commonly known as:  HYDRODIURIL      TAKE these medications       carvedilol 12.5 MG tablet  Commonly known as:  COREG  Take 12.5 mg by mouth 2 (two) times daily with a meal.     cyclobenzaprine 10 MG tablet  Commonly known as:  FLEXERIL  Take 1 tablet (10 mg total) by mouth 2 (two) times daily as needed for muscle spasms.     DSS 100 MG Caps  Take 100 mg by mouth 2 (two) times daily as needed for mild constipation.     gabapentin 100 MG capsule  Commonly known as:  NEURONTIN  Take 100 mg by mouth 3 (three) times daily.     insulin glargine 100 UNIT/ML injection  Commonly known as:  LANTUS  Inject 30 Units into the skin at bedtime.     lisinopril 40 MG tablet  Commonly known as:  PRINIVIL,ZESTRIL  Take 40 mg by mouth daily.     metFORMIN 850 MG tablet  Commonly known as:  GLUCOPHAGE  Take 850 mg by mouth 3 (three) times daily.     NOVOLOG 100 UNIT/ML injection  Generic drug:  insulin aspart  Inject 8 Units into the skin 2 (two) times daily.     oxyCODONE 5 MG immediate release tablet   Commonly known as:  Oxy IR/ROXICODONE  Take 1 tablet (5 mg total) by mouth every 6 (six) hours as needed for severe pain.     pantoprazole 40 MG tablet  Commonly known as:  PROTONIX  Take 1 tablet (40 mg total) by mouth daily.         Brief H and P: For complete details please refer to admission H and P, but in brief patient is a 63 year old male with history of diabetes, hypertension, polysubstance abuse, GERD, chronic right-sided weakness, prior history of syncope presented with a syncopal episode. Patient reported her 40s admission while walking through the hallway on the way to the bathroom, his right leg suddenly gave out and he fell hitting his head on the wall with loss of consciousness. Patient stated that his wife heard him fall and came to pick him up and noted that he had some slurred speech with some shaking on his right side. Patient denied any fevers, chills, nausea or any vomiting. He was seen in ED, orthostatics were negative. CT of the head and C-spine were unremarkable. Glucose was 298 otherwise basic metabolic profile was within normal limits. Patient was admitted for further workup.  Hospital Course:  Tyler Patterson is a 63 y.o. male with past medical history of diabetes, hypertension, polysubstance abuse, gastroesophageal reflux disease with chronic right-sided weakness and a prior history of syncope of unknown etiology, presented with syncope and fall hitting his neck. He also had some slurred speech as per the wife. Orthostatics in ED, done was negative. CT of the head and C-spine which were done was unremarkable. Patient was placed in a soft c-collar. TRAUMA and neurology consulted and he was admitted for evaluation of stroke to the hospitalist service.   1 syncope/right-sided weakness Questionable etiology, likely vasovagal. CT of the head was negative. Patient has had extensive workup in 2013 which was unrevealing. Patient with complaints of worsening right-sided weakness.  MRI of the brain was obtained which was negative for any acute stroke, it showed atrophy and small vessel disease. Neurology was consulted and recommended EEG which was negative for any acute seizures. Patient was strongly recommended to stop drinking alcohol. Lumbar spine x-ray and thoracic spine x-rays were obtained which were also negative for any disc degenerative joint disease. Patient was at his baseline at the time of discharge.  #2 diabetes mellitus hemoglobin A1c 9.4, patient was strongly recommended to be compliant with the outpatient insulin regimen and metformin.   #3 hypertension Resume home regimen of Coreg, lisinopril. Hydralazine as needed.   #4 fall  Questionable etiology. Due to concern for trauma per ED physician, Trauma called to assess the patient. No further recommendations.   #5 gastroesophageal reflux disease  PPI.      Day of Discharge BP 155/67  Pulse 60  Temp(Src) 98 F (36.7 C) (Oral)  Resp 18  Ht 5\' 8"  (1.727 m)  Wt 82.5 kg (181 lb 14.1 oz)  BMI 27.66 kg/m2  SpO2 100%  Physical Exam: General: Alert and awake oriented x3 not in any acute distress. CVS: S1-S2 clear no murmur rubs or gallops Chest: clear to auscultation bilaterally, no wheezing rales or rhonchi Abdomen: soft nontender, nondistended, normal bowel sounds Extremities: no cyanosis, clubbing or edema noted bilaterally Neuro: Cranial nerves II-XII intact, no focal neurological deficits   The results of significant diagnostics from this hospitalization (including imaging, microbiology, ancillary and laboratory) are listed below for reference.    LAB RESULTS: Basic Metabolic Panel:  Recent Labs Lab 05/13/13 0902 05/13/13 0936 05/13/13 1430 05/14/13 0408  NA 135* 135*  --  134*  K 4.3 4.2  --  4.0  CL 97 98  --  98  CO2 25  --   --  22  GLUCOSE 298* 302*  --  267*  BUN 13 13  --  13  CREATININE 0.89 1.00 0.80 0.91  CALCIUM 8.9  --   --  8.4  MG  --   --  1.6  --    Liver  Function Tests:  Recent Labs Lab 05/13/13 0902  AST 15  ALT 12  ALKPHOS 84  BILITOT 0.7  PROT 7.1  ALBUMIN 3.4*   No results found for this basename: LIPASE, AMYLASE,  in the last 168 hours No results found for this basename: AMMONIA,  in the last 168 hours CBC:  Recent Labs Lab 05/13/13 0902  05/13/13 1430 05/14/13 0408  WBC 3.5*  --  4.8 5.3  NEUTROABS 1.7  --   --   --   HGB 13.6  < > 14.4 12.8*  HCT 38.4*  < > 39.3 36.2*  MCV 95.0  --  94.0 95.0  PLT 178  --  186 177  < > = values in this interval not displayed. Cardiac Enzymes:  Recent Labs Lab 05/13/13 0854  TROPONINI <0.30   BNP: No components found with this basename: POCBNP,  CBG:  Recent Labs Lab 05/15/13 0744 05/15/13 1129  GLUCAP 141* 95    Significant Diagnostic Studies:  Ct Head Wo Contrast  05/13/2013   CLINICAL DATA:  [place]. Loss of consciousness at that time. Also fell today but did not hit head. Blurred vision. Chronic right-sided weakness. Neck pain.  EXAM: CT HEAD WITHOUT CONTRAST  CT CERVICAL SPINE WITHOUT CONTRAST  TECHNIQUE: Multidetector CT imaging of the head and cervical spine was performed following the standard protocol without intravenous contrast. Multiplanar CT image reconstructions of the cervical spine were also generated.  COMPARISON:  MR brain 07/2011.  CT head 08/19/2011.  FINDINGS: CT HEAD FINDINGS  Premature for age cerebral and cerebellar atrophy. Chronic microvascular ischemic change affects the periventricular and subcortical white matter. No evidence for acute infarction, hemorrhage, mass lesion, hydrocephalus, or extra-axial fluid. Calvarium intact. Chronic left maxillary sinus disease. Negative orbits.  CT CERVICAL SPINE FINDINGS  There is no visible cervical spine fracture, traumatic subluxation, prevertebral soft tissue swelling, or intraspinal hematoma. Intervertebral disc spaces are preserved. Limbus vertebra C5-7. No lung apex lesion. No visible neck masses.   IMPRESSION: No acute intracranial findings. Atrophy with small vessel disease is stable from 2013.  No cervical spine fracture or traumatic subluxation.   Electronically Signed   By: Rolla Flatten M.D.   On: 05/13/2013 10:48   Ct Cervical Spine Wo Contrast  05/13/2013   CLINICAL DATA:  [place]. Loss of consciousness at that time. Also fell today but did not hit head. Blurred vision. Chronic right-sided weakness. Neck pain.  EXAM: CT HEAD WITHOUT CONTRAST  CT CERVICAL SPINE WITHOUT CONTRAST  TECHNIQUE: Multidetector CT imaging of the head and cervical spine was performed following the standard protocol without intravenous contrast. Multiplanar CT image reconstructions of the cervical spine were also generated.  COMPARISON:  MR brain 07/2011.  CT head 08/19/2011.  FINDINGS: CT HEAD FINDINGS  Premature for age cerebral and cerebellar atrophy. Chronic microvascular ischemic change affects the periventricular and subcortical white matter. No evidence for acute infarction, hemorrhage, mass lesion, hydrocephalus, or extra-axial fluid. Calvarium intact. Chronic left maxillary sinus disease. Negative orbits.  CT CERVICAL SPINE FINDINGS  There is no visible cervical spine fracture, traumatic subluxation, prevertebral soft tissue swelling, or intraspinal hematoma. Intervertebral disc spaces are preserved. Limbus vertebra C5-7. No lung apex lesion. No visible neck masses.  IMPRESSION: No acute intracranial findings. Atrophy with small vessel disease is stable from 2013.  No cervical spine fracture or traumatic subluxation.   Electronically Signed   By: Rolla Flatten M.D.   On: 05/13/2013 10:48   Mr Brain Wo Contrast  05/14/2013   CLINICAL DATA:  Chronic right-sided weakness. Syncope with fall. Stroke risk factors include diabetes, and hypertension.  EXAM: MRI HEAD WITHOUT CONTRAST  TECHNIQUE: Multiplanar, multiecho pulse sequences of the brain and surrounding structures were obtained without intravenous contrast.   COMPARISON:  DG CERV SPINE FLEX&EXT ONLY dated 05/14/2013; MR HEAD W/O CM dated 08/20/2011; CT HEAD W/O CM dated 05/13/2013; CT C SPINE W/O CM dated 05/13/2013  FINDINGS: No evidence for acute infarction, hemorrhage, mass lesion, hydrocephalus, or extra-axial fluid. Moderate premature for age cerebral and cerebellar atrophy. Moderately extensive chronic microvascular ischemic change affects the periventricular and subcortical white matter. No midline abnormalities. Flow voids are preserved. Negative orbits, sinuses,  and mastoids. No osseous findings. Similar appearance to priors.  IMPRESSION: Atrophy and small vessel disease.  No acute intracranial findings.   Electronically Signed   By: Rolla Flatten M.D.   On: 05/14/2013 19:45   Dg Cerv Spine Flex&ext Only  05/14/2013   CLINICAL DATA:  Post recurrent fall with persistent neck pain  EXAM: CERVICAL SPINE - FLEXION AND EXTENSION VIEWS ONLY  COMPARISON:  CT C SPINE W/O CM dated 05/13/2013  FINDINGS: C1 to the superior endplate of C7 is visualized on all provided radiographs. There is obscuration of the cervical thoracic junction secondary to overlying osseous soft tissue structures.  Normal alignment of the cervical spine. No anterolisthesis or retrolisthesis. Normal alignment is maintained given the acquired degrees of flexion and extension.  Cervical vertebral body heights are preserved. Prevertebral soft tissues are normal.  There is mild multilevel cervical spine DDD, worse at C4-C5, C5-C6 and C6-C7 with disc space height loss, endplate irregularity and small posteriorly directed disc osteophyte complexes at these locations.  Regional soft tissues appear normal.  IMPRESSION: 1. No evidence of dynamic instability of the cervical spine given the acquired degrees of flexion and extension. 2. Mild multilevel cervical spine DDD.   Electronically Signed   By: Sandi Mariscal M.D.   On: 05/14/2013 08:31     Disposition and Follow-up:     Discharge Orders   Future Orders  Complete By Expires   Diet - low sodium heart healthy  As directed    Increase activity slowly  As directed        DISPOSITION: Home  DIET:Carb modified diet   DISCHARGE FOLLOW-UP Follow-up Information   Follow up with Nolene Ebbs A, MD. Schedule an appointment as soon as possible for a visit in 10 days. (for hospital follow-up)    Specialty:  Internal Medicine   Contact information:   [place] [address] [phone]       Time spent on Discharge: 38 mins  Signed:   Theona Muhs M.D. [facility] 05/15/2013, 4:02 PM Pager: [phone]

## 2013-05-15 NOTE — Procedures (Signed)
ELECTROENCEPHALOGRAM REPORT   Patient: Tyler Patterson       Room #: [id number] EEG No. ID: [id number] Age: 63 y.o.        Sex: male Referring Physician: Rai Report Date:  05/15/2013        Interpreting Physician: Alexis Goodell D  History: Tyler Patterson is an 63 y.o. male with recurrent episodes of syncope  Medications:  Scheduled: . carvedilol  12.5 mg Oral BID WC  . docusate sodium  100 mg Oral BID  . enoxaparin (LOVENOX) injection  40 mg Subcutaneous Q24H  . folic acid  1 mg Oral Daily  . gabapentin  100 mg Oral TID  . insulin aspart  0-15 Units Subcutaneous TID WC  . insulin glargine  30 Units Subcutaneous QHS  . lisinopril  40 mg Oral Daily  . pantoprazole  40 mg Oral Q0600  . sodium chloride  3 mL Intravenous Q12H  . thiamine  100 mg Oral Daily    Conditions of Recording:  This is a 16 channel EEG carried out with the patient in the awake and drowsy states.  Description:  The waking background activity consists of a low voltage, symmetrical, fairly well organized, 10 Hz alpha activity, seen from the parieto-occipital and posterior temporal regions.  Low voltage fast activity, poorly organized, is seen anteriorly and is at times superimposed on more posterior regions.  A mixture of theta and alpha rhythms are seen from the central and temporal regions. The patient drowses with slowing to irregular, low voltage theta and beta activity.   Stage II sleep is not obtained. Hyperventilation was not performed.  Intermittent photic stimulation was performed but failed to illicit any change in the tracing.   IMPRESSION: This is a normal electroencephalogram.  No epileptiform activity is noted.   Alexis Goodell, MD [facility] [phone] 05/15/2013, 1:10 PM

## 2014-10-03 ENCOUNTER — Encounter (HOSPITAL_COMMUNITY): Payer: Self-pay | Admitting: Emergency Medicine

## 2014-10-03 ENCOUNTER — Inpatient Hospital Stay (HOSPITAL_COMMUNITY)
Admission: EM | Admit: 2014-10-03 | Discharge: 2014-10-05 | DRG: 101 | Disposition: A | Discharge status: Home / Self Care | Payer: PPO | Attending: Internal Medicine | Admitting: Internal Medicine

## 2014-10-03 ENCOUNTER — Emergency Department (HOSPITAL_COMMUNITY): Payer: PPO

## 2014-10-03 DIAGNOSIS — F10230 Alcohol dependence with withdrawal, uncomplicated: Secondary | ICD-10-CM

## 2014-10-03 DIAGNOSIS — F10239 Alcohol dependence with withdrawal, unspecified: Secondary | ICD-10-CM | POA: Diagnosis present

## 2014-10-03 DIAGNOSIS — K219 Gastro-esophageal reflux disease without esophagitis: Secondary | ICD-10-CM | POA: Diagnosis present

## 2014-10-03 DIAGNOSIS — Z794 Long term (current) use of insulin: Secondary | ICD-10-CM

## 2014-10-03 DIAGNOSIS — I10 Essential (primary) hypertension: Secondary | ICD-10-CM | POA: Diagnosis present

## 2014-10-03 DIAGNOSIS — R45851 Suicidal ideations: Secondary | ICD-10-CM | POA: Diagnosis present

## 2014-10-03 DIAGNOSIS — R569 Unspecified convulsions: Secondary | ICD-10-CM

## 2014-10-03 DIAGNOSIS — G4089 Other seizures: Secondary | ICD-10-CM | POA: Diagnosis not present

## 2014-10-03 DIAGNOSIS — R32 Unspecified urinary incontinence: Secondary | ICD-10-CM | POA: Diagnosis present

## 2014-10-03 DIAGNOSIS — F1010 Alcohol abuse, uncomplicated: Secondary | ICD-10-CM | POA: Diagnosis present

## 2014-10-03 DIAGNOSIS — E876 Hypokalemia: Secondary | ICD-10-CM | POA: Diagnosis not present

## 2014-10-03 DIAGNOSIS — E119 Type 2 diabetes mellitus without complications: Secondary | ICD-10-CM | POA: Diagnosis present

## 2014-10-03 DIAGNOSIS — F1024 Alcohol dependence with alcohol-induced mood disorder: Secondary | ICD-10-CM | POA: Insufficient documentation

## 2014-10-03 DIAGNOSIS — Z888 Allergy status to other drugs, medicaments and biological substances status: Secondary | ICD-10-CM

## 2014-10-03 DIAGNOSIS — F17210 Nicotine dependence, cigarettes, uncomplicated: Secondary | ICD-10-CM | POA: Diagnosis present

## 2014-10-03 HISTORY — DX: Unspecified convulsions: R56.9

## 2014-10-03 LAB — COMPREHENSIVE METABOLIC PANEL
ALBUMIN: 3.1 g/dL — AB (ref 3.5–5.0)
ALT: 15 U/L — AB (ref 17–63)
AST: 24 U/L (ref 15–41)
Alkaline Phosphatase: 79 U/L (ref 38–126)
Anion gap: 13 (ref 5–15)
BUN: 10 mg/dL (ref 6–20)
CO2: 28 mmol/L (ref 22–32)
Calcium: 8.8 mg/dL — ABNORMAL LOW (ref 8.9–10.3)
Chloride: 95 mmol/L — ABNORMAL LOW (ref 101–111)
Creatinine, Ser: 1.09 mg/dL (ref 0.61–1.24)
GFR calc Af Amer: >60 mL/min (ref >60)
GFR calc non Af Amer: >60 mL/min (ref >60)
GLUCOSE: 450 mg/dL — AB (ref 65–99)
Potassium: 3 mmol/L — ABNORMAL LOW (ref 3.5–5.1)
Sodium: 136 mmol/L (ref 135–145)
Total Bilirubin: 0.7 mg/dL (ref 0.3–1.2)
Total Protein: 6.5 g/dL (ref 6.5–8.1)

## 2014-10-03 LAB — CBC WITH DIFFERENTIAL/PLATELET
BASOS ABS: 0 10*3/uL (ref 0.0–0.1)
Basophils Relative: 0 % (ref 0–1)
EOS ABS: 0 10*3/uL (ref 0.0–0.7)
Eosinophils Relative: 1 % (ref 0–5)
HCT: 37.1 % — ABNORMAL LOW (ref 39.0–52.0)
Hemoglobin: 13.4 g/dL (ref 13.0–17.0)
LYMPHS ABS: 1 10*3/uL (ref 0.7–4.0)
LYMPHS PCT: 23 % (ref 12–46)
MCH: 33.3 pg (ref 26.0–34.0)
MCHC: 36.1 g/dL — ABNORMAL HIGH (ref 30.0–36.0)
MCV: 92.1 fL (ref 78.0–100.0)
Monocytes Absolute: 0.3 10*3/uL (ref 0.1–1.0)
Monocytes Relative: 7 % (ref 3–12)
Neutro Abs: 2.9 10*3/uL (ref 1.7–7.7)
Neutrophils Relative %: 69 % (ref 43–77)
PLATELETS: 201 10*3/uL (ref 150–400)
RBC: 4.03 MIL/uL — ABNORMAL LOW (ref 4.22–5.81)
RDW: 13.1 % (ref 11.5–15.5)
WBC: 4.2 10*3/uL (ref 4.0–10.5)

## 2014-10-03 LAB — CBG MONITORING, ED
Glucose-Capillary: 186 mg/dL — ABNORMAL HIGH (ref 65–99)
Glucose-Capillary: 456 mg/dL — ABNORMAL HIGH (ref 65–99)

## 2014-10-03 LAB — PROTIME-INR
INR: 0.91 (ref 0.00–1.49)
PROTHROMBIN TIME: 12.5 s (ref 11.6–15.2)

## 2014-10-03 LAB — URINE MICROSCOPIC-ADD ON

## 2014-10-03 LAB — RAPID URINE DRUG SCREEN, HOSP PERFORMED
Amphetamines: NOT DETECTED
BARBITURATES: NOT DETECTED
BENZODIAZEPINES: NOT DETECTED
Cocaine: NOT DETECTED
OPIATES: NOT DETECTED
TETRAHYDROCANNABINOL: NOT DETECTED

## 2014-10-03 LAB — URINALYSIS, ROUTINE W REFLEX MICROSCOPIC
BILIRUBIN URINE: NEGATIVE
Glucose, UA: >1000 mg/dL — AB
Ketones, ur: NEGATIVE mg/dL
Leukocytes, UA: NEGATIVE
Nitrite: NEGATIVE
PROTEIN: 30 mg/dL — AB
Specific Gravity, Urine: 1.021 (ref 1.005–1.030)
Urobilinogen, UA: 0.2 mg/dL (ref 0.0–1.0)
pH: 7 (ref 5.0–8.0)

## 2014-10-03 LAB — ETHANOL: Alcohol, Ethyl (B): <5 mg/dL (ref <5)

## 2014-10-03 MED ORDER — SODIUM CHLORIDE 0.9 % IV BOLUS (SEPSIS)
1000.0000 mL | Freq: Once | INTRAVENOUS | Status: AC
  Administered 2014-10-03: 1000 mL via INTRAVENOUS

## 2014-10-03 MED ORDER — INSULIN ASPART 100 UNIT/ML ~~LOC~~ SOLN
10.0000 [IU] | Freq: Once | SUBCUTANEOUS | Status: AC
  Administered 2014-10-03: 10 [IU] via INTRAVENOUS
  Filled 2014-10-03: qty 1

## 2014-10-03 MED ORDER — HYDRALAZINE HCL 20 MG/ML IJ SOLN
10.0000 mg | INTRAMUSCULAR | Status: AC
  Administered 2014-10-03: 10 mg via INTRAVENOUS
  Filled 2014-10-03: qty 1

## 2014-10-03 MED ORDER — POTASSIUM CHLORIDE CRYS ER 20 MEQ PO TBCR
40.0000 meq | EXTENDED_RELEASE_TABLET | Freq: Once | ORAL | Status: AC
  Administered 2014-10-03: 40 meq via ORAL
  Filled 2014-10-03: qty 2

## 2014-10-03 MED ORDER — LORAZEPAM 2 MG/ML IJ SOLN
1.0000 mg | Freq: Once | INTRAMUSCULAR | Status: AC
  Administered 2014-10-03: 1 mg via INTRAVENOUS
  Filled 2014-10-03: qty 1

## 2014-10-03 NOTE — ED Notes (Signed)
Attempted to contact provider about pt's screening responses, will continue to call

## 2014-10-03 NOTE — ED Notes (Addendum)
Delsa Sale, PA aware of pt's suicidal ideations.

## 2014-10-03 NOTE — ED Provider Notes (Signed)
CSN: [id number]     Arrival date & time 10/03/14  2012 History   First MD Initiated Contact with Patient 10/03/14 2020     Chief Complaint  Patient presents with  . Seizures     (Consider location/radiation/quality/duration/timing/severity/associated sxs/prior Treatment) HPI Comments: Patient is a 64 year old male past medical history significant for DM, HTN, GERD presenting to the emergency department for evaluation of seizures. Patient states last going to use the restroom, states he recalls his partner talking to him while he was having seizure. Patient's partner is on sure of how long the seizure lasted. She endorses generalized shaking, describes the patient coming in the mouth with incontinence. She is unaware of how long it lasted stating "it seemed to last a lifetime." She believes the patient hit his head when he fell to the ground. Patient states he has not taken any of his home medications today. He denies a history of seizures. He denies any antiepileptics use. Patient is endorsing suicidal ideations to the nurse seeing has been thinking about it for 1 year, states he has held a gun in his hand to the nurse but never pulled the trigger.  Patient is a 64 y.o. male presenting with seizures.  Seizures   Past Medical History  Diagnosis Date  . Diabetes mellitus   . Hypertension   . Shortness of breath   . GERD (gastroesophageal reflux disease)   . Headache(784.0)   . Headache 05/13/2013  . Seizures    Past Surgical History  Procedure Laterality Date  . Hernia repair     History reviewed. No pertinent family history. History  Substance Use Topics  . Smoking status: Current Some Day Smoker -- 1.00 packs/day    Types: Cigars, Cigarettes  . Smokeless tobacco: Never Used     Comment: smokes black & mild cigars on the weekends  . Alcohol Use: Yes     Comment: 11-12 beers a day    Review of Systems  Neurological: Positive for seizures.  All other systems reviewed and are  negative.     Allergies  Other  Home Medications   Prior to Admission medications   Medication Sig Start Date End Date Taking? Authorizing Provider  baclofen (LIORESAL) 10 MG tablet Take 10 mg by mouth 2 (two) times daily as needed for muscle spasms.   Yes Historical Provider, MD  carvedilol (COREG) 12.5 MG tablet Take 25 mg by mouth 2 (two) times daily with a meal.    Yes Historical Provider, MD  cyanocobalamin 1000 MCG tablet Take 1,000 mcg by mouth daily.    Yes Historical Provider, MD  DULoxetine (CYMBALTA) 60 MG capsule Take 60 mg by mouth at bedtime.   Yes Historical Provider, MD  folic acid (FOLVITE) 1 MG tablet Take 1 mg by mouth daily.   Yes Historical Provider, MD  insulin glargine (LANTUS) 100 UNIT/ML injection Inject 35 Units into the skin every evening.  08/21/11  Yes Monika Salk, MD  insulin regular (NOVOLIN R,HUMULIN R) 100 units/mL injection Inject 15 Units into the skin 2 (two) times daily before a meal.   Yes Historical Provider, MD  lisinopril (PRINIVIL,ZESTRIL) 40 MG tablet Take 40 mg by mouth daily.   Yes Historical Provider, MD  metFORMIN (GLUCOPHAGE) 1000 MG tablet Take 1,000 mg by mouth 2 (two) times daily with a meal.   Yes Historical Provider, MD  pantoprazole (PROTONIX) 40 MG tablet Take 1 tablet (40 mg total) by mouth daily. 05/15/13  Yes Ripudeep Krystal Eaton, MD  cyclobenzaprine (FLEXERIL) 10 MG tablet Take 1 tablet (10 mg total) by mouth 2 (two) times daily as needed for muscle spasms. 05/15/13   Ripudeep Krystal Eaton, MD  docusate sodium 100 MG CAPS Take 100 mg by mouth 2 (two) times daily as needed for mild constipation. 05/15/13   Ripudeep Krystal Eaton, MD  gabapentin (NEURONTIN) 100 MG capsule Take 100 mg by mouth 3 (three) times daily.    Historical Provider, MD  oxyCODONE (OXY IR/ROXICODONE) 5 MG immediate release tablet Take 1 tablet (5 mg total) by mouth every 6 (six) hours as needed for severe pain. 05/15/13   Ripudeep K Rai, MD   BP 164/80 mmHg  Pulse 85  Temp(Src) 98.9 F  (37.2 C) (Oral)  Resp 16  Ht 5\' 8"  (1.727 m)  Wt 165 lb (74.844 kg)  BMI 25.09 kg/m2  SpO2 100% Physical Exam  Constitutional: He is oriented to person, place, and time. He appears well-developed and well-nourished. No distress.  HENT:  Head: Normocephalic and atraumatic.  Right Ear: External ear normal.  Left Ear: External ear normal.  Nose: Nose normal.  Mouth/Throat: Oropharynx is clear and moist. No oropharyngeal exudate.  Eyes: Conjunctivae and EOM are normal. Pupils are equal, round, and reactive to light.  Neck: Normal range of motion. Neck supple.  Cardiovascular: Normal rate, regular rhythm, normal heart sounds and intact distal pulses.   Pulmonary/Chest: Effort normal and breath sounds normal. No respiratory distress.  Abdominal: Soft. There is no tenderness.  Musculoskeletal: Normal range of motion.  Neurological: He is alert and oriented to person, place, and time. He has normal strength. No cranial nerve deficit. Gait normal. GCS eye subscore is 4. GCS verbal subscore is 5. GCS motor subscore is 6.  Sensation grossly intact.  No pronator drift.  Bilateral heel-knee-shin intact.  Skin: Skin is warm and dry. He is not diaphoretic.  Psychiatric: He expresses suicidal ideation. He expresses suicidal plans.  Nursing note and vitals reviewed.   ED Course  Procedures (including critical care time) Medications  thiamine (VITAMIN B-1) tablet 100 mg (not administered)    Or  thiamine (B-1) injection 100 mg (not administered)  0.9 %  sodium chloride infusion (not administered)  acetaminophen (TYLENOL) tablet 650 mg (not administered)    Or  acetaminophen (TYLENOL) suppository 650 mg (not administered)  HYDROmorphone (DILAUDID) injection 0.5-1 mg (not administered)  ondansetron (ZOFRAN) tablet 4 mg (not administered)    Or  ondansetron (ZOFRAN) injection 4 mg (not administered)  alum & mag hydroxide-simeth (MAALOX/MYLANTA) 200-200-20 MG/5ML suspension 30 mL (not  administered)  sodium chloride 0.9 % 1,000 mL with thiamine 123XX123 mg, folic acid 1 mg, multivitamins adult 10 mL infusion (not administered)  LORazepam (ATIVAN) injection 2-3 mg (not administered)  sodium chloride 0.9 % bolus 1,000 mL (0 mLs Intravenous Stopped 10/03/14 2250)  hydrALAZINE (APRESOLINE) injection 10 mg (10 mg Intravenous Given 10/03/14 2208)  potassium chloride SA (K-DUR,KLOR-CON) CR tablet 40 mEq (40 mEq Oral Given 10/03/14 2246)  insulin aspart (novoLOG) injection 10 Units (10 Units Intravenous Given 10/03/14 2246)  sodium chloride 0.9 % bolus 1,000 mL (0 mLs Intravenous Stopped 10/03/14 2343)  LORazepam (ATIVAN) injection 1 mg (1 mg Intravenous Given 10/03/14 2247)  sodium chloride 0.9 % bolus 1,000 mL (0 mLs Intravenous Stopped 10/04/14 0252)  LORazepam (ATIVAN) injection 1 mg (1 mg Intravenous Given 10/04/14 0112)    Labs Review Labs Reviewed  COMPREHENSIVE METABOLIC PANEL - Abnormal; Notable for the following:    Potassium 3.0 (*)  Chloride 95 (*)    Glucose, Bld 450 (*)    Calcium 8.8 (*)    Albumin 3.1 (*)    ALT 15 (*)    All other components within normal limits  CBC WITH DIFFERENTIAL/PLATELET - Abnormal; Notable for the following:    RBC 4.03 (*)    HCT 37.1 (*)    MCHC 36.1 (*)    All other components within normal limits  URINALYSIS, ROUTINE W REFLEX MICROSCOPIC (NOT AT [facility]) - Abnormal; Notable for the following:    Glucose, UA >1000 (*)    Hgb urine dipstick TRACE (*)    Protein, ur 30 (*)    All other components within normal limits  CBG MONITORING, ED - Abnormal; Notable for the following:    Glucose-Capillary 456 (*)    All other components within normal limits  CBG MONITORING, ED - Abnormal; Notable for the following:    Glucose-Capillary 186 (*)    All other components within normal limits  URINE RAPID DRUG SCREEN, HOSP PERFORMED  ETHANOL  PROTIME-INR  URINE MICROSCOPIC-ADD ON    Imaging Review Ct Head Wo Contrast  10/03/2014   CLINICAL DATA:  Seizure  after using the bathroom. Fall with head injury. Initial encounter.  EXAM: CT HEAD WITHOUT CONTRAST  TECHNIQUE: Contiguous axial images were obtained from the base of the skull through the vertex without intravenous contrast.  COMPARISON:  05/13/2013  FINDINGS: Skull and Sinuses:No acute fracture or destructive process.  Inflammatory mucosal thickening in the bilateral paranasal sinuses without sinus effusion.  Orbits: No acute abnormality.  Brain: No evidence of acute infarction, hemorrhage, hydrocephalus, or mass lesion/mass effect. There is cerebral volume loss with prominent but stable ventriculomegaly. Confluent bilateral cerebral white matter low density is progressed from prior, especially in the subcortical white matter at the vertex. No cortical findings to explain seizure.  IMPRESSION: 1. No traumatic findings related to fall. 2. Extensive white matter disease, likely chronic small vessel ischemia, which has progressed from 2015. 3. No cortical findings to explain seizure.   Electronically Signed   By: Monte Fantasia M.D.   On: 10/03/2014 22:02     EKG Interpretation None      10:34 PM Patient told RN he has been drinking again, drinks 8-11 beers a day, last drink July 4th.   MDM   Final diagnoses:  Alcohol withdrawal seizure, uncomplicated  Suicidal ideations   Filed Vitals:   10/04/14 0530  BP: 164/80  Pulse: 85  Temp:   Resp: 16   Afebrile, NAD, non-toxic appearing, AAOx4.   I have reviewed nursing notes, vital signs, and all appropriate lab and imaging results if ordered as above.     Patient presenting to the emergency department after witnessed seizure. No neurofocal deficits on examination. He is back to baseline. Labs reviewed. Imaging reviewed. Seizure likely due to alcohol withdrawal. TTS is seen patient and evaluated, does meet inpatient criteria. Given the persistent tachycardia despite IV fluids and Ativan will admit patient for alcohol withdrawal seizures. TTS  will be reconsult in the hospital once he is medically cleared for further management of suicidal ideation. Hyperglycemia also resolved with IV fluid and insulin administration. No evidence of metabolic acidosis. Patient admitted for further management and evaluation. Patient d/w with Dr. Ashok Cordia, agrees with plan.    Baron Sane, PA-C 10/04/14 [id number]  Lajean Saver, MD 10/08/14 [phone]

## 2014-10-03 NOTE — ED Notes (Signed)
Family member states she thinks he hit his head when he fell during his seizure.

## 2014-10-03 NOTE — ED Notes (Signed)
Staffing notified of need for pt sitter

## 2014-10-03 NOTE — ED Notes (Signed)
Per EMS:  Pt sts he went to the restroom and when he came out he fell to the floor.  Sts he can recall his partner talking to him while having the seizure.  Hx of seizures.  Alert and oriented in room at this time.

## 2014-10-04 DIAGNOSIS — Z794 Long term (current) use of insulin: Secondary | ICD-10-CM | POA: Diagnosis not present

## 2014-10-04 DIAGNOSIS — E119 Type 2 diabetes mellitus without complications: Secondary | ICD-10-CM | POA: Diagnosis not present

## 2014-10-04 DIAGNOSIS — F17210 Nicotine dependence, cigarettes, uncomplicated: Secondary | ICD-10-CM | POA: Diagnosis not present

## 2014-10-04 DIAGNOSIS — Z888 Allergy status to other drugs, medicaments and biological substances status: Secondary | ICD-10-CM | POA: Diagnosis not present

## 2014-10-04 DIAGNOSIS — E118 Type 2 diabetes mellitus with unspecified complications: Secondary | ICD-10-CM | POA: Diagnosis not present

## 2014-10-04 DIAGNOSIS — F10239 Alcohol dependence with withdrawal, unspecified: Secondary | ICD-10-CM | POA: Diagnosis not present

## 2014-10-04 DIAGNOSIS — R45851 Suicidal ideations: Secondary | ICD-10-CM | POA: Diagnosis not present

## 2014-10-04 DIAGNOSIS — I10 Essential (primary) hypertension: Secondary | ICD-10-CM

## 2014-10-04 DIAGNOSIS — R569 Unspecified convulsions: Secondary | ICD-10-CM

## 2014-10-04 DIAGNOSIS — G4089 Other seizures: Secondary | ICD-10-CM | POA: Diagnosis not present

## 2014-10-04 DIAGNOSIS — F1010 Alcohol abuse, uncomplicated: Secondary | ICD-10-CM | POA: Diagnosis present

## 2014-10-04 DIAGNOSIS — R32 Unspecified urinary incontinence: Secondary | ICD-10-CM | POA: Diagnosis not present

## 2014-10-04 DIAGNOSIS — K219 Gastro-esophageal reflux disease without esophagitis: Secondary | ICD-10-CM | POA: Diagnosis not present

## 2014-10-04 DIAGNOSIS — E876 Hypokalemia: Secondary | ICD-10-CM | POA: Diagnosis not present

## 2014-10-04 LAB — MAGNESIUM: Magnesium: 1.3 mg/dL — ABNORMAL LOW (ref 1.7–2.4)

## 2014-10-04 LAB — GLUCOSE, CAPILLARY
GLUCOSE-CAPILLARY: 172 mg/dL — AB (ref 65–99)
Glucose-Capillary: 343 mg/dL — ABNORMAL HIGH (ref 65–99)
Glucose-Capillary: 187 mg/dL — ABNORMAL HIGH (ref 65–99)

## 2014-10-04 LAB — MRSA PCR SCREENING: MRSA by PCR: NEGATIVE

## 2014-10-04 LAB — CBG MONITORING, ED: Glucose-Capillary: 162 mg/dL — ABNORMAL HIGH (ref 65–99)

## 2014-10-04 MED ORDER — CARVEDILOL 25 MG PO TABS
25.0000 mg | ORAL_TABLET | Freq: Two times a day (BID) | ORAL | Status: DC
  Administered 2014-10-04 – 2014-10-05 (×3): 25 mg via ORAL
  Filled 2014-10-04 (×4): qty 1

## 2014-10-04 MED ORDER — ACETAMINOPHEN 325 MG PO TABS: 650.0000 mg | ORAL_TABLET | Freq: Four times a day (QID) | ORAL | Status: DC | PRN

## 2014-10-04 MED ORDER — LISINOPRIL 40 MG PO TABS
40.0000 mg | ORAL_TABLET | Freq: Every day | ORAL | Status: DC
  Administered 2014-10-04 – 2014-10-05 (×2): 40 mg via ORAL
  Filled 2014-10-04 (×2): qty 1

## 2014-10-04 MED ORDER — ONDANSETRON HCL 4 MG PO TABS: 4.0000 mg | ORAL_TABLET | Freq: Four times a day (QID) | ORAL | Status: DC | PRN

## 2014-10-04 MED ORDER — ONDANSETRON HCL 4 MG/2ML IJ SOLN: 4.0000 mg | Freq: Four times a day (QID) | INTRAMUSCULAR | Status: DC | PRN

## 2014-10-04 MED ORDER — HYDROMORPHONE HCL 1 MG/ML IJ SOLN: 0.5000 mg | INTRAMUSCULAR | Status: DC | PRN

## 2014-10-04 MED ORDER — FOLIC ACID 1 MG PO TABS
1.0000 mg | ORAL_TABLET | Freq: Every day | ORAL | Status: DC
  Administered 2014-10-04 – 2014-10-05 (×2): 1 mg via ORAL
  Filled 2014-10-04 (×2): qty 1

## 2014-10-04 MED ORDER — VITAMIN B-1 100 MG PO TABS
100.0000 mg | ORAL_TABLET | Freq: Every day | ORAL | Status: DC
  Administered 2014-10-05: 100 mg via ORAL
  Filled 2014-10-04: qty 1

## 2014-10-04 MED ORDER — SODIUM CHLORIDE 0.9 % IV SOLN
INTRAVENOUS | Status: DC
  Administered 2014-10-04 (×2): via INTRAVENOUS
  Filled 2014-10-04 (×5): qty 1000

## 2014-10-04 MED ORDER — ACETAMINOPHEN 650 MG RE SUPP: 650.0000 mg | Freq: Four times a day (QID) | RECTAL | Status: DC | PRN

## 2014-10-04 MED ORDER — SODIUM CHLORIDE 0.9 % IV SOLN
INTRAVENOUS | Status: DC
  Administered 2014-10-04: 06:00:00 via INTRAVENOUS

## 2014-10-04 MED ORDER — INSULIN ASPART 100 UNIT/ML ~~LOC~~ SOLN
0.0000 [IU] | Freq: Three times a day (TID) | SUBCUTANEOUS | Status: DC
  Administered 2014-10-04: 7 [IU] via SUBCUTANEOUS
  Administered 2014-10-04: 2 [IU] via SUBCUTANEOUS
  Administered 2014-10-05 (×2): 3 [IU] via SUBCUTANEOUS
  Administered 2014-10-05: 9 [IU] via SUBCUTANEOUS

## 2014-10-04 MED ORDER — LORAZEPAM 2 MG/ML IJ SOLN: 2.0000 mg | INTRAMUSCULAR | Status: DC | PRN

## 2014-10-04 MED ORDER — ALUM & MAG HYDROXIDE-SIMETH 200-200-20 MG/5ML PO SUSP: 30.0000 mL | Freq: Four times a day (QID) | ORAL | Status: DC | PRN

## 2014-10-04 MED ORDER — THIAMINE HCL 100 MG/ML IJ SOLN: 100.0000 mg | Freq: Every day | INTRAMUSCULAR | Status: DC

## 2014-10-04 MED ORDER — THIAMINE HCL 100 MG/ML IJ SOLN
Freq: Once | INTRAVENOUS | Status: AC
  Administered 2014-10-04: 06:00:00 via INTRAVENOUS
  Filled 2014-10-04: qty 1000

## 2014-10-04 MED ORDER — MAGNESIUM SULFATE 50 % IJ SOLN
3.0000 g | Freq: Once | INTRAVENOUS | Status: DC
  Filled 2014-10-04: qty 6

## 2014-10-04 MED ORDER — PANTOPRAZOLE SODIUM 40 MG PO TBEC
40.0000 mg | DELAYED_RELEASE_TABLET | Freq: Every day | ORAL | Status: DC
  Administered 2014-10-04 – 2014-10-05 (×2): 40 mg via ORAL
  Filled 2014-10-04 (×2): qty 1

## 2014-10-04 MED ORDER — LORAZEPAM 2 MG/ML IJ SOLN
1.0000 mg | Freq: Once | INTRAMUSCULAR | Status: AC
  Administered 2014-10-04: 1 mg via INTRAVENOUS
  Filled 2014-10-04: qty 1

## 2014-10-04 NOTE — Progress Notes (Addendum)
Patient seen and examined, admitted this morning for alcohol-induced seizure, suicidal ideation  Patient is alert oriented admits to drinking 8-11 beers on a daily basis  Transfer to telemetry, continue sitter  Psychiatry consultation for suicidal ideation  Replete potassium and magnesium  Resume home antihypertensives medications, start the patient on sliding scale insulin,

## 2014-10-04 NOTE — BH Assessment (Addendum)
Tele Assessment Note   Tyler Patterson is an 64 y.o. married male who was brought into the [facility] tonight by EMS after a call from his wife.  Per pt he had a seizure tonight for the first time.  Per PA-C, seizure was most likely due to level of alcohol use. Pt sts that he thinks about killing himself 2-3 times a week with the last time being "a couple of days ago." Pt sts that on 4-5 occasions he has held a gun in his hand planning to kill himself but not pulled the trigger. Pt sts that the last time he attempted was approximately week before last. Pt denies HI, SHI and AVH. Pt sts he is depressed because of his declining health and because "I can't do like I want to do...like I use to do." Pt sts that what keeps him from killing himself when he has attempted is the thoughts of his granddaughters and what his death will mean to them. Pt sts that his gun is now secured and controlled by "a couple of people that I would have to go through now to get it." Pt sts that he has lost approximately 50 pounds in "a few weeks" and sleeps only 1 1/2 to 2 hours per night.  Pt consumes alcohol daily.  Pt estimates that he drinks 12 beers and 4-5 "drinks" of liquor each day. Pt denies use of any recreational drugs.   Pt lives with his wife.  Pt stated he has no hx of IP admissions for mental health reasons and sts he has never seen an OPT.  Pt reports that he uses a cane to ambulate.  Pt reports that he can complete his ADLs independently.   Pt was awake and responsive during the assessment but, did not open his eyes for contact or move at all.  Pt spoke in a soft, low-tone voice often slurring his words. Pt's thought processes were coherent and relevant but his judgement was impaired. Pt's mood was depressed and his flat affect was congruent. Pt was oriented x 4.   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 Diabetes mellitus   . Hypertension   .  Shortness of breath   . GERD (gastroesophageal reflux disease)   . Headache(784.0)   . Headache 05/13/2013  . Seizures    Axis IV: other psychosocial or environmental problems and problems with primary support group Axis V: 1-10 persistent dangerousness to self and others present  Past Medical History:  Past Medical History  Diagnosis Date  . Diabetes mellitus   . Hypertension   . Shortness of breath   . GERD (gastroesophageal reflux disease)   . Headache(784.0)   . Headache 05/13/2013  . Seizures     Past Surgical History  Procedure Laterality Date  . Hernia repair      Family History: History reviewed. No pertinent family history.  Social History:  reports that he has been smoking Cigars and Cigarettes.  He has been smoking about 1.00 pack per day. He has never used smokeless tobacco. He reports that he drinks alcohol. He reports that he does not use illicit drugs.  Additional Social History:  Alcohol / Drug Use Prescriptions: See PTA list History of alcohol / drug use?: Yes Longest period of sobriety (when/how long): "don't know" Substance #1 Name of Substance 1: Alcohol 1 - Age of First Use: 16 1 - Amount (size/oz): 12 beers and 4-5  drinks of liquor 1 - Frequency: daily 1 - Duration: "long time but I use to drink more than that" 1 - Last Use / Amount: "Monday"  CIWA: CIWA-Ar BP: 161/84 mmHg Pulse Rate: 105 COWS:    PATIENT STRENGTHS: (choose at least two) Ability for insight Average or above average intelligence Communication skills Supportive family/friends  Allergies:  Allergies  Allergen Reactions  . Other     Unknown medication that he is allergic to. Starts with an Feronal    Home Medications:  (Not in a hospital admission)  OB/GYN Status:  No LMP for male patient.  General Assessment Data Location of Assessment: [facility] ED TTS Assessment: In system Is this a Tele or Face-to-Face Assessment?: Tele Assessment Is this an Initial Assessment or a  Re-assessment for this encounter?: Initial Assessment Marital status: Married [place] name: na Is patient pregnant?: No Pregnancy Status: No Living Arrangements: Spouse/significant other Can pt return to current living arrangement?: Yes Admission Status: Voluntary Is patient capable of signing voluntary admission?: Yes Referral Source: Self/Family/Friend Insurance type: Medicaid  Medical Screening Exam ([place]) Medical Exam completed: Yes  Crisis Care Plan Living Arrangements: Spouse/significant other Name of Psychiatrist: none Name of Therapist: none  Education Status Is patient currently in school?: No Current Grade: na Highest grade of school patient has completed: 41 (and attended technical college) Name of school: na Contact person: na  Risk to self with the past 6 months Suicidal Ideation: Yes-Currently Present Has patient been a risk to self within the past 6 months prior to admission? : Yes Suicidal Intent: Yes-Currently Present Has patient had any suicidal intent within the past 6 months prior to admission? : Yes Is patient at risk for suicide?: Yes Suicidal Plan?: Yes-Currently Present Has patient had any suicidal plan within the past 6 months prior to admission? : Yes Access to Means: Yes Specify Access to Suicidal Means: access to gun What has been your use of drugs/alcohol within the last 12 months?: daily alcohol use Previous Attempts/Gestures: Yes How many times?: 5 Other Self Harm Risks: none noted Triggers for Past Attempts: Other (Comment) (declining health & aging) Intentional Self Injurious Behavior: None Family Suicide History: Unknown Recent stressful life event(s): Recent negative physical changes Persecutory voices/beliefs?: No Depression: Yes Depression Symptoms: Insomnia, Isolating, Fatigue, Guilt, Loss of interest in usual pleasures, Feeling worthless/self pity, Feeling angry/irritable Substance abuse history and/or treatment for  substance abuse?: Yes Suicide prevention information given to non-admitted patients: Not applicable  Risk to Others within the past 6 months Homicidal Ideation: No (denies) Does patient have any lifetime risk of violence toward others beyond the six months prior to admission? : No (denies) Thoughts of Harm to Others: No (denies) Current Homicidal Intent: No (denies) Current Homicidal Plan: No (denies) Access to Homicidal Means: Yes Describe Access to Homicidal Means: access to a gun Identified Victim: na History of harm to others?: No (denies) Assessment of Violence: None Noted Violent Behavior Description: na Does patient have access to weapons?: Yes (Comment) Criminal Charges Pending?: No Does patient have a court date: No Is patient on probation?: No  Psychosis Hallucinations: None noted Delusions: None noted  Mental Status Report Appearance/Hygiene: In scrubs, Unremarkable Eye Contact: Poor (Eyes closed during assessment) Motor Activity: Unsteady (no motor activity- staying still; seizure earlier in the day) Speech: Logical/coherent, Soft, Slurred Level of Consciousness: Quiet/awake Mood: Depressed, Pleasant Affect: Flat Anxiety Level: None Thought Processes: Coherent, Relevant Judgement: Partial Orientation: Person, Place, Time, Situation Obsessive Compulsive Thoughts/Behaviors: None  Cognitive  Functioning Concentration: Good Memory: Recent Intact, Remote Intact IQ: Average Insight: Poor Impulse Control: Poor Appetite: Poor Weight Loss: 50 (50 lbs in weeks) Weight Gain: 0 Sleep: Decreased Total Hours of Sleep: 2 (1 1/2 to 2 hours per night) Vegetative Symptoms: None  ADLScreening [facility] Assessment Services) Patient's cognitive ability adequate to safely complete daily activities?:  (unclear) Patient able to express need for assistance with ADLs?: Yes Independently performs ADLs?:  (unclear)  Prior Inpatient Therapy Prior Inpatient Therapy: No  (denies) Prior Therapy Dates: na Prior Therapy Facilty/Provider(s): na Reason for Treatment: na  Prior Outpatient Therapy Prior Outpatient Therapy: No Prior Therapy Dates: na Prior Therapy Facilty/Provider(s): na Reason for Treatment: na Does patient have an ACCT team?: No Does patient have Intensive In-House Services?  : No Does patient have [facility] services? : No Does patient have P4CC services?: No  ADL Screening (condition at time of admission) Patient's cognitive ability adequate to safely complete daily activities?:  (unclear) Patient able to express need for assistance with ADLs?: Yes Independently performs ADLs?:  (unclear)       Abuse/Neglect Assessment (Assessment to be complete while patient is alone) Physical Abuse: Denies Verbal Abuse: Denies Sexual Abuse: Denies     Advance Directives (For Healthcare) Does patient have an advance directive?: No Would patient like information on creating an advanced directive?: No - patient declined information    Additional Information 1:1 In Past 12 Months?: No CIRT Risk: No Elopement Risk: No Does patient have medical clearance?: No     Disposition:  Disposition Initial Assessment Completed for this Encounter: Yes Disposition of Patient: Other dispositions (Pending review w [place]) Other disposition(s): Other (Comment)  Per Patriciaann Clan, PA: Meets IP criteria.  [facility] placement.   Per Inocencio Homes, AC: No appropriate bed at [facility] at this time. TTS to seek appropriate outside placement.   Spoke to [employer], PA-C: Advised of recommendation.  She stated she would talk to the pt about signing himself in voluntarily for treatment.  Asked about pt's ADLs and ambulation.  Per pt he walks with a cane and can handle ADLs such as bathing independently.  Faylene Kurtz, MS, CRC, [place] Triage Specialist [facility] T 10/04/2014 12:36 AM

## 2014-10-04 NOTE — ED Notes (Signed)
Called pharmacy. B-1 injection will be rescheduled, given the OK to give fluids containing thiamine, folic acid, and multivitamins.

## 2014-10-04 NOTE — Progress Notes (Signed)
Pt admitted to the unit at 1435. Pt mental status is a&ox4. Pt oriented to room, staff, and call bell. Skin is intact. Call bell within reach. Patient has suicide sitter.

## 2014-10-04 NOTE — H&P (Addendum)
[facility] Admission History and Physical       Tyler Patterson [id number] DOB: July 15, 1950 DOA: 10/03/2014  Referring physician: EDP PCP: Philis Fendt, MD  Specialists:   Chief Complaint:  Seizure  HPI: Tyler Patterson is a 64 y.o. male with a history of ETOH Abuse and recent relapse who was brought to the ED after he felll to the floor in his home and reportedly hit his head and was witnessed as having convulsions and loss of consciousness, and his bowels and Urine.   He was brought to the ED and administered IV Ativan.    In the ED he was placed on Seizure precautions and a CT scan of the head was performed and was negative for acute findings.  When he became alert he reported that he had began drinking again and has been drinking 8-11 beers daily, and his last drink was on July 4th.    His ETOH Level was < 5.   He was referred for admission.   Per the EDP, Patient had Suicidal Ideation, and was placed on suicide precautions.     Review of Systems: Unable to Obtain from the Patient  Past Medical History  Diagnosis Date  . Diabetes mellitus   . Hypertension   . Shortness of breath   . GERD (gastroesophageal reflux disease)   . Headache(784.0)   . Headache 05/13/2013  . Seizures      Past Surgical History  Procedure Laterality Date  . Hernia repair        Prior to Admission medications   Medication Sig Start Date End Date Taking? Authorizing Provider  baclofen (LIORESAL) 10 MG tablet Take 10 mg by mouth 2 (two) times daily as needed for muscle spasms.   Yes Historical Provider, MD  carvedilol (COREG) 12.5 MG tablet Take 25 mg by mouth 2 (two) times daily with a meal.    Yes Historical Provider, MD  cyanocobalamin 1000 MCG tablet Take 1,000 mcg by mouth daily.    Yes Historical Provider, MD  DULoxetine (CYMBALTA) 60 MG capsule Take 60 mg by mouth at bedtime.   Yes Historical Provider, MD  folic acid (FOLVITE) 1 MG tablet Take 1 mg by mouth daily.   Yes Historical  Provider, MD  insulin glargine (LANTUS) 100 UNIT/ML injection Inject 35 Units into the skin every evening.  08/21/11  Yes Monika Salk, MD  insulin regular (NOVOLIN R,HUMULIN R) 100 units/mL injection Inject 15 Units into the skin 2 (two) times daily before a meal.   Yes Historical Provider, MD  lisinopril (PRINIVIL,ZESTRIL) 40 MG tablet Take 40 mg by mouth daily.   Yes Historical Provider, MD  metFORMIN (GLUCOPHAGE) 1000 MG tablet Take 1,000 mg by mouth 2 (two) times daily with a meal.   Yes Historical Provider, MD  pantoprazole (PROTONIX) 40 MG tablet Take 1 tablet (40 mg total) by mouth daily. 05/15/13  Yes Ripudeep Krystal Eaton, MD  cyclobenzaprine (FLEXERIL) 10 MG tablet Take 1 tablet (10 mg total) by mouth 2 (two) times daily as needed for muscle spasms. 05/15/13   Ripudeep Krystal Eaton, MD  docusate sodium 100 MG CAPS Take 100 mg by mouth 2 (two) times daily as needed for mild constipation. 05/15/13   Ripudeep Krystal Eaton, MD  gabapentin (NEURONTIN) 100 MG capsule Take 100 mg by mouth 3 (three) times daily.    Historical Provider, MD  oxyCODONE (OXY IR/ROXICODONE) 5 MG immediate release tablet Take 1 tablet (5 mg total) by mouth every 6 (six)  hours as needed for severe pain. 05/15/13   Ripudeep Krystal Eaton, MD     Allergies  Allergen Reactions  . Other     Unknown medication that he is allergic to. Starts with an [place]    Social History:  reports that he has been smoking Cigars and Cigarettes.  He has been smoking about 1.00 pack per day. He has never used smokeless tobacco. He reports that he drinks alcohol. He reports that he does not use illicit drugs.     History reviewed. No pertinent family history.     Physical Exam:  GEN:  Sedated Well Developed 64 y.o. African American male examined and in no acute distress; cooperative with exam Filed Vitals:   10/03/14 2315 10/03/14 2330 10/03/14 2345 10/04/14 0000  BP: 163/88 160/84 148/81 161/84  Pulse: 112 107 116 105  Temp:      TempSrc:      Resp: 16 15 15  17   Height:      Weight:      SpO2: 100% 100% 100% 100%   Blood pressure 161/84, pulse 105, temperature 98.9 F (37.2 C), temperature source Oral, resp. rate 17, height 5\' 8"  (1.727 m), weight 74.844 kg (165 lb), SpO2 100 %. PSYCH: He is alert and oriented x 0; does not appear anxious does not appear depressed; affect is normal HEENT: Normocephalic and Atraumatic, Mucous membranes pink; PERRLA; EOM intact; Fundi:  Benign;  No scleral icterus, Nares: Patent, Oropharynx: Clear,    Neck:  FROM, No Cervical Lymphadenopathy nor Thyromegaly or Carotid Bruit; No JVD; Breasts:: Not examined CHEST WALL: No tenderness CHEST: Normal respiration, clear to auscultation bilaterally HEART: Regular rate and rhythm; no murmurs rubs or gallops BACK: No kyphosis or scoliosis; No CVA tenderness ABDOMEN: Positive Bowel Sounds, Soft Non-Tender, No Rebound or Guarding; No Masses, No Organomegaly. Rectal Exam: Not done EXTREMITIES: No Cyanosis, Clubbing, or Edema; No Ulcerations. Genitalia: not examined PULSES: 2+ and symmetric SKIN: Normal hydration no rash or ulceration CNS:  Alert and Oriented x 0, Obtunded,  4, No Focal Deficits Vascular: pulses palpable throughout    Labs on Admission:  Basic Metabolic Panel:  Recent Labs Lab 10/03/14 2148  NA 136  K 3.0*  CL 95*  CO2 28  GLUCOSE 450*  BUN 10  CREATININE 1.09  CALCIUM 8.8*   Liver Function Tests:  Recent Labs Lab 10/03/14 2148  AST 24  ALT 15*  ALKPHOS 79  BILITOT 0.7  PROT 6.5  ALBUMIN 3.1*   No results for input(s): LIPASE, AMYLASE in the last 168 hours. No results for input(s): AMMONIA in the last 168 hours. CBC:  Recent Labs Lab 10/03/14 2148  WBC 4.2  NEUTROABS 2.9  HGB 13.4  HCT 37.1*  MCV 92.1  PLT 201   Cardiac Enzymes: No results for input(s): CKTOTAL, CKMB, CKMBINDEX, TROPONINI in the last 168 hours.  BNP (last 3 results) No results for input(s): BNP in the last 8760 hours.  ProBNP (last 3 results) No  results for input(s): PROBNP in the last 8760 hours.  CBG:  Recent Labs Lab 10/03/14 2118 10/03/14 2337  GLUCAP 456* 186*    Radiological Exams on Admission: Ct Head Wo Contrast  10/03/2014   CLINICAL DATA:  Seizure after using the bathroom. Fall with head injury. Initial encounter.  EXAM: CT HEAD WITHOUT CONTRAST  TECHNIQUE: Contiguous axial images were obtained from the base of the skull through the vertex without intravenous contrast.  COMPARISON:  05/13/2013  FINDINGS: Skull and Sinuses:No  acute fracture or destructive process.  Inflammatory mucosal thickening in the bilateral paranasal sinuses without sinus effusion.  Orbits: No acute abnormality.  Brain: No evidence of acute infarction, hemorrhage, hydrocephalus, or mass lesion/mass effect. There is cerebral volume loss with prominent but stable ventriculomegaly. Confluent bilateral cerebral white matter low density is progressed from prior, especially in the subcortical white matter at the vertex. No cortical findings to explain seizure.  IMPRESSION: 1. No traumatic findings related to fall. 2. Extensive white matter disease, likely chronic small vessel ischemia, which has progressed from 2015. 3. No cortical findings to explain seizure.   Electronically Signed   By: Monte Fantasia M.D.   On: 10/03/2014 22:02     EKG: Independently reviewed.    Assessment/Plan:   64 y.o. male with   Principal Problem:   1.    Alcohol withdrawal seizure- Last drink 10/01/2014   CIWA PROTOCOL   Seizure Precautions   Neuro Check      Active Problems:   2.    Alcohol abuse-    Relapsed    3.     Suicide Precautions   Suicide Precautions   1:1 Sitter   Psych Consult     3.    DM (diabetes mellitus)- on Lantus Insulin   SSI coverage   Check HbA1C     4.    HTN (hypertension)   Resume Carvedilol, and Lisinopril   PRN IV Hydralazine while NPO     5.    DVT Prophylaxis   SCDs     Code Status:     FULL CODE       Family  Communication:   No Family Present    Disposition Plan:    Inpatient   Status        Time spent:  [facility] Pager [phone]   If [place] Please Contact the Day Rounding Team MD for [facility]  If 7PM-7AM, Please Contact Night-Floor Coverage  [email or web address] Password [email or web address] 10/04/2014, 2:18 AM     ADDENDUM:   Patient was seen and examined on 10/04/2014

## 2014-10-04 NOTE — ED Notes (Signed)
This RN and Tori, RN verbally confirmed with pt understanding that we were removing his cell phone from his possession.  No other belongings in room except soiled trousers, bagged and waiting for pt to be more alert to verbalize okay to throw away.

## 2014-10-04 NOTE — ED Notes (Signed)
TTS in process 

## 2014-10-04 NOTE — Care Management Note (Signed)
Case Management Note  Patient Details  Name: Tyler Patterson MRN: [id number] Date of Birth: 08/10/1950  Subjective/Objective:    Pt admitted on 10/03/14 with ETOH withdrawal seizure, suicidal ideations.   PTA, pt independent, lives with spouse.                  Action/Plan: Psych CSW consulted; psych MD consult pending.  Will follow progress.    Expected Discharge Date:  10/06/14               Expected Discharge Plan:  Home/Self Care  In-House Referral:  Clinical Social Work  Discharge planning Services  CM Consult  Post Acute Care Choice:    Choice offered to:     DME Arranged:    DME Agency:     HH Arranged:    HH Agency:     Status of Service:  In process, will continue to follow  Medicare Important Message Given:    Date Medicare IM Given:    Medicare IM give by:    Date Additional Medicare IM Given:    Additional Medicare Important Message give by:     If discussed at [place] of Stay Meetings, dates discussed:    Additional Comments:  Reinaldo Raddle, RN, BSN  Trauma/Neuro ICU Case Manager [phone]

## 2014-10-04 NOTE — ED Notes (Signed)
Wife Mena Michelotti [phone]

## 2014-10-05 DIAGNOSIS — F1024 Alcohol dependence with alcohol-induced mood disorder: Secondary | ICD-10-CM | POA: Insufficient documentation

## 2014-10-05 DIAGNOSIS — F10239 Alcohol dependence with withdrawal, unspecified: Secondary | ICD-10-CM

## 2014-10-05 DIAGNOSIS — R45851 Suicidal ideations: Secondary | ICD-10-CM

## 2014-10-05 LAB — COMPREHENSIVE METABOLIC PANEL
ALK PHOS: 58 U/L (ref 38–126)
ALT: 11 U/L — ABNORMAL LOW (ref 17–63)
AST: 14 U/L — AB (ref 15–41)
Albumin: 2.6 g/dL — ABNORMAL LOW (ref 3.5–5.0)
Anion gap: 6 (ref 5–15)
BUN: 6 mg/dL (ref 6–20)
CHLORIDE: 105 mmol/L (ref 101–111)
CO2: 25 mmol/L (ref 22–32)
CREATININE: 1.01 mg/dL (ref 0.61–1.24)
Calcium: 8.5 mg/dL — ABNORMAL LOW (ref 8.9–10.3)
GFR calc Af Amer: >60 mL/min (ref >60)
GFR calc non Af Amer: >60 mL/min (ref >60)
Glucose, Bld: 236 mg/dL — ABNORMAL HIGH (ref 65–99)
POTASSIUM: 5.2 mmol/L — AB (ref 3.5–5.1)
Sodium: 136 mmol/L (ref 135–145)
Total Bilirubin: 0.9 mg/dL (ref 0.3–1.2)
Total Protein: 5.4 g/dL — ABNORMAL LOW (ref 6.5–8.1)

## 2014-10-05 LAB — CBC
HCT: 36.1 % — ABNORMAL LOW (ref 39.0–52.0)
Hemoglobin: 12.4 g/dL — ABNORMAL LOW (ref 13.0–17.0)
MCH: 32.1 pg (ref 26.0–34.0)
MCHC: 34.3 g/dL (ref 30.0–36.0)
MCV: 93.5 fL (ref 78.0–100.0)
Platelets: 179 10*3/uL (ref 150–400)
RBC: 3.86 MIL/uL — ABNORMAL LOW (ref 4.22–5.81)
RDW: 13.3 % (ref 11.5–15.5)
WBC: 3.9 10*3/uL — ABNORMAL LOW (ref 4.0–10.5)

## 2014-10-05 LAB — GLUCOSE, CAPILLARY
GLUCOSE-CAPILLARY: 234 mg/dL — AB (ref 65–99)
Glucose-Capillary: 215 mg/dL — ABNORMAL HIGH (ref 65–99)

## 2014-10-05 LAB — MAGNESIUM: MAGNESIUM: 1.7 mg/dL (ref 1.7–2.4)

## 2014-10-05 LAB — HEMOGLOBIN A1C
HEMOGLOBIN A1C: 10.3 % — AB (ref 4.8–5.6)
Mean Plasma Glucose: 249 mg/dL

## 2014-10-05 MED ORDER — MAGNESIUM OXIDE 400 MG PO TABS
400.0000 mg | ORAL_TABLET | Freq: Two times a day (BID) | ORAL | Status: DC
Start: 2014-10-05 — End: 2019-09-18

## 2014-10-05 MED ORDER — SERTRALINE HCL 25 MG PO TABS: 25.0000 mg | ORAL_TABLET | Freq: Every day | ORAL | Status: DC

## 2014-10-05 MED ORDER — ALUM & MAG HYDROXIDE-SIMETH 200-200-20 MG/5ML PO SUSP: 30.0000 mL | Freq: Four times a day (QID) | ORAL | Status: DC | PRN

## 2014-10-05 MED ORDER — THIAMINE HCL 100 MG PO TABS: 100.0000 mg | ORAL_TABLET | Freq: Every day | ORAL | Status: DC

## 2014-10-05 NOTE — Clinical Social Work Psych Note (Signed)
CSW received call from RN stating patient to be discharged home with outpatient psych follow-up.  Patient to follow-up at [facility]. Address: [address] [place], [place] [address] Phone number: [phone]  Appointments are NOT available for new patients. Patient will have to attend "walk-in" hours which are Monday - Friday from 8am to 3pm. Patient will need to bring the following items: ID card, list of medications, insurance card.  Lubertha Sayres, [place] Clinical Social Work Department Orthopedics [phone]) and Surgical [phone])

## 2014-10-05 NOTE — Care Management Note (Signed)
Case Management Note  Patient Details  Name: Tyler Patterson MRN: [id number] Date of Birth: 07/18/1950  Subjective/Objective:                    Action/Plan:  Discharge to home self care Expected Discharge Date:  10/06/14               Expected Discharge Plan:  Home/Self Care  In-House Referral:  Clinical Social Work  Discharge planning Services  CM Consult  Post Acute Care Choice:    Choice offered to:     DME Arranged:    DME Agency:     HH Arranged:    [place] Agency:     Status of Service:  Completed, signed off  Medicare Important Message Given:    Date Medicare IM Given:    Medicare IM give by:    Date Additional Medicare IM Given:    Additional Medicare Important Message give by:     If discussed at [place] of Stay Meetings, dates discussed:    Additional Comments:  Carles Collet, RN 10/05/2014, 11:24 AM

## 2014-10-05 NOTE — Discharge Summary (Signed)
Physician Discharge Summary  TAOS TAPP MRN: [id number] DOB/AGE: 1950-08-22 64 y.o.  PCP: Philis Fendt, MD   Admit date: 10/03/2014 Discharge date: 10/05/2014  Discharge Diagnoses:     Principal Problem:   Alcohol withdrawal seizure Active Problems:   DM (diabetes mellitus)   HTN (hypertension)   Alcohol abuse  hypomagnesemia   Follow-up recommendations Follow-up with PCP in 3-5 days , including although additional recommended appointments as below Follow-up CBC, CMP, magnesium in 3-5 days      Medication List    STOP taking these medications        oxyCODONE 5 MG immediate release tablet  Commonly known as:  Oxy IR/ROXICODONE      TAKE these medications        alum & mag hydroxide-simeth 200-200-20 MG/5ML suspension  Commonly known as:  MAALOX/MYLANTA  Take 30 mLs by mouth every 6 (six) hours as needed for indigestion or heartburn (dyspepsia).     baclofen 10 MG tablet  Commonly known as:  LIORESAL  Take 10 mg by mouth 2 (two) times daily as needed for muscle spasms.     carvedilol 12.5 MG tablet  Commonly known as:  COREG  Take 25 mg by mouth 2 (two) times daily with a meal.     cyanocobalamin 1000 MCG tablet  Take 1,000 mcg by mouth daily.     cyclobenzaprine 10 MG tablet  Commonly known as:  FLEXERIL  Take 1 tablet (10 mg total) by mouth 2 (two) times daily as needed for muscle spasms.     DSS 100 MG Caps  Take 100 mg by mouth 2 (two) times daily as needed for mild constipation.     DULoxetine 60 MG capsule  Commonly known as:  CYMBALTA  Take 60 mg by mouth at bedtime.     folic acid 1 MG tablet  Commonly known as:  FOLVITE  Take 1 mg by mouth daily.     gabapentin 100 MG capsule  Commonly known as:  NEURONTIN  Take 100 mg by mouth 3 (three) times daily.     insulin glargine 100 UNIT/ML injection  Commonly known as:  LANTUS  Inject 35 Units into the skin every evening.     insulin regular 100 units/mL injection  Commonly known as:   NOVOLIN R,HUMULIN R  Inject 15 Units into the skin 2 (two) times daily before a meal.     lisinopril 40 MG tablet  Commonly known as:  PRINIVIL,ZESTRIL  Take 40 mg by mouth daily.     magnesium oxide 400 MG tablet  Commonly known as:  MAG-OX  Take 1 tablet (400 mg total) by mouth 2 (two) times daily.     metFORMIN 1000 MG tablet  Commonly known as:  GLUCOPHAGE  Take 1,000 mg by mouth 2 (two) times daily with a meal.     pantoprazole 40 MG tablet  Commonly known as:  PROTONIX  Take 1 tablet (40 mg total) by mouth daily.     thiamine 100 MG tablet  Take 1 tablet (100 mg total) by mouth daily.         Discharge Condition: Stable    Disposition: 01-Home or Self Care   Consults: * Psychiatry  Significant Diagnostic Studies:  Ct Head Wo Contrast  10/03/2014   CLINICAL DATA:  Seizure after using the bathroom. Fall with head injury. Initial encounter.  EXAM: CT HEAD WITHOUT CONTRAST  TECHNIQUE: Contiguous axial images were obtained from the base of the skull through  the vertex without intravenous contrast.  COMPARISON:  05/13/2013  FINDINGS: Skull and Sinuses:No acute fracture or destructive process.  Inflammatory mucosal thickening in the bilateral paranasal sinuses without sinus effusion.  Orbits: No acute abnormality.  Brain: No evidence of acute infarction, hemorrhage, hydrocephalus, or mass lesion/mass effect. There is cerebral volume loss with prominent but stable ventriculomegaly. Confluent bilateral cerebral white matter low density is progressed from prior, especially in the subcortical white matter at the vertex. No cortical findings to explain seizure.  IMPRESSION: 1. No traumatic findings related to fall. 2. Extensive white matter disease, likely chronic small vessel ischemia, which has progressed from 2015. 3. No cortical findings to explain seizure.   Electronically Signed   By: Monte Fantasia M.D.   On: 10/03/2014 22:02       Filed Weights   10/03/14 2034 10/04/14  0646  Weight: 74.844 kg (165 lb) 73.3 kg (161 lb 9.6 oz)     Microbiology: Recent Results (from the past 240 hour(s))  MRSA PCR Screening     Status: None   Collection Time: 10/04/14  6:53 AM  Result Value Ref Range Status   MRSA by PCR NEGATIVE NEGATIVE Final    Comment:        The GeneXpert MRSA Assay (FDA approved for NASAL specimens only), is one component of a comprehensive MRSA colonization surveillance program. It is not intended to diagnose MRSA infection nor to guide or monitor treatment for MRSA infections.        Blood Culture    Component Value Date/Time   SDES BLOOD RIGHT HAND 03/25/2010 1515   SPECREQUEST BOTTLES DRAWN AEROBIC ONLY 5CC 03/25/2010 1515   CULT NO GROWTH 5 DAYS 03/25/2010 1515   REPTSTATUS 03/31/2010 FINAL 03/25/2010 1515      Labs: Results for orders placed or performed during the hospital encounter of 10/03/14 (from the past 48 hour(s))  POC CBG, ED     Status: Abnormal   Collection Time: 10/03/14  9:18 PM  Result Value Ref Range   Glucose-Capillary 456 (H) 65 - 99 mg/dL  Hemoglobin A1c     Status: Abnormal   Collection Time: 10/03/14  9:40 PM  Result Value Ref Range   Hgb A1c MFr Bld 10.3 (H) 4.8 - 5.6 %    Comment: (NOTE)         Pre-diabetes: 5.7 - 6.4         Diabetes: >6.4         Glycemic control for adults with diabetes: <7.0    Mean Plasma Glucose 249 mg/dL    Comment: (NOTE) Performed At: [facility] [place], [place] [id number] Lindon Romp MD NW:[id number]   Urine rapid drug screen (hosp performed)not at [facility]     Status: None   Collection Time: 10/03/14  9:44 PM  Result Value Ref Range   Opiates NONE DETECTED NONE DETECTED   Cocaine NONE DETECTED NONE DETECTED   Benzodiazepines NONE DETECTED NONE DETECTED   Amphetamines NONE DETECTED NONE DETECTED   Tetrahydrocannabinol NONE DETECTED NONE DETECTED   Barbiturates NONE DETECTED NONE DETECTED    Comment:        DRUG SCREEN FOR MEDICAL  PURPOSES ONLY.  IF CONFIRMATION IS NEEDED FOR ANY PURPOSE, NOTIFY LAB WITHIN 5 DAYS.        LOWEST DETECTABLE LIMITS FOR URINE DRUG SCREEN Drug Class       Cutoff (ng/mL) Amphetamine      1000 Barbiturate      200  Benzodiazepine   323 Tricyclics       557 Opiates          300 Cocaine          300 THC              50   Urinalysis, Routine w reflex microscopic (not at [facility])     Status: Abnormal   Collection Time: 10/03/14  9:44 PM  Result Value Ref Range   Color, Urine YELLOW YELLOW   APPearance CLEAR CLEAR   Specific Gravity, Urine 1.021 1.005 - 1.030   pH 7.0 5.0 - 8.0   Glucose, UA >1000 (A) NEGATIVE mg/dL   Hgb urine dipstick TRACE (A) NEGATIVE   Bilirubin Urine NEGATIVE NEGATIVE   Ketones, ur NEGATIVE NEGATIVE mg/dL   Protein, ur 30 (A) NEGATIVE mg/dL   Urobilinogen, UA 0.2 0.0 - 1.0 mg/dL   Nitrite NEGATIVE NEGATIVE   Leukocytes, UA NEGATIVE NEGATIVE  Urine microscopic-add on     Status: None   Collection Time: 10/03/14  9:44 PM  Result Value Ref Range   Squamous Epithelial / LPF RARE RARE   RBC / HPF 0-2 <3 RBC/hpf   Bacteria, UA RARE RARE  Comprehensive metabolic panel     Status: Abnormal   Collection Time: 10/03/14  9:48 PM  Result Value Ref Range   Sodium 136 135 - 145 mmol/L   Potassium 3.0 (L) 3.5 - 5.1 mmol/L   Chloride 95 (L) 101 - 111 mmol/L   CO2 28 22 - 32 mmol/L   Glucose, Bld 450 (H) 65 - 99 mg/dL   BUN 10 6 - 20 mg/dL   Creatinine, Ser 1.09 0.61 - 1.24 mg/dL   Calcium 8.8 (L) 8.9 - 10.3 mg/dL   Total Protein 6.5 6.5 - 8.1 g/dL   Albumin 3.1 (L) 3.5 - 5.0 g/dL   AST 24 15 - 41 U/L   ALT 15 (L) 17 - 63 U/L   Alkaline Phosphatase 79 38 - 126 U/L   Total Bilirubin 0.7 0.3 - 1.2 mg/dL   GFR calc non Af Amer >60 >60 mL/min   GFR calc Af Amer >60 >60 mL/min    Comment: (NOTE) The eGFR has been calculated using the CKD EPI equation. This calculation has not been validated in all clinical situations. eGFR's persistently <60 mL/min signify possible  Chronic Kidney Disease.    Anion gap 13 5 - 15  CBC WITH DIFFERENTIAL     Status: Abnormal   Collection Time: 10/03/14  9:48 PM  Result Value Ref Range   WBC 4.2 4.0 - 10.5 K/uL   RBC 4.03 (L) 4.22 - 5.81 MIL/uL   Hemoglobin 13.4 13.0 - 17.0 g/dL   HCT 37.1 (L) 39.0 - 52.0 %   MCV 92.1 78.0 - 100.0 fL   MCH 33.3 26.0 - 34.0 pg   MCHC 36.1 (H) 30.0 - 36.0 g/dL   RDW 13.1 11.5 - 15.5 %   Platelets 201 150 - 400 K/uL   Neutrophils Relative % 69 43 - 77 %   Neutro Abs 2.9 1.7 - 7.7 K/uL   Lymphocytes Relative 23 12 - 46 %   Lymphs Abs 1.0 0.7 - 4.0 K/uL   Monocytes Relative 7 3 - 12 %   Monocytes Absolute 0.3 0.1 - 1.0 K/uL   Eosinophils Relative 1 0 - 5 %   Eosinophils Absolute 0.0 0.0 - 0.7 K/uL   Basophils Relative 0 0 - 1 %   Basophils Absolute  0.0 0.0 - 0.1 K/uL  Ethanol/ETOH     Status: None   Collection Time: 10/03/14  9:48 PM  Result Value Ref Range   Alcohol, Ethyl (B) <5 <5 mg/dL    Comment:        LOWEST DETECTABLE LIMIT FOR SERUM ALCOHOL IS 5 mg/dL FOR MEDICAL PURPOSES ONLY   Protime-INR     Status: None   Collection Time: 10/03/14  9:48 PM  Result Value Ref Range   Prothrombin Time 12.5 11.6 - 15.2 seconds   INR 0.91 0.00 - 1.49  POC CBG, ED     Status: Abnormal   Collection Time: 10/03/14 11:37 PM  Result Value Ref Range   Glucose-Capillary 186 (H) 65 - 99 mg/dL   Comment 1 Notify RN   CBG monitoring, ED     Status: Abnormal   Collection Time: 10/04/14  6:13 AM  Result Value Ref Range   Glucose-Capillary 162 (H) 65 - 99 mg/dL  MRSA PCR Screening     Status: None   Collection Time: 10/04/14  6:53 AM  Result Value Ref Range   MRSA by PCR NEGATIVE NEGATIVE    Comment:        The GeneXpert MRSA Assay (FDA approved for NASAL specimens only), is one component of a comprehensive MRSA colonization surveillance program. It is not intended to diagnose MRSA infection nor to guide or monitor treatment for MRSA infections.   Magnesium     Status: Abnormal    Collection Time: 10/04/14  8:09 AM  Result Value Ref Range   Magnesium 1.3 (L) 1.7 - 2.4 mg/dL  Glucose, capillary     Status: Abnormal   Collection Time: 10/04/14 12:14 PM  Result Value Ref Range   Glucose-Capillary 172 (H) 65 - 99 mg/dL   Comment 1 Notify RN    Comment 2 Document in Chart   Glucose, capillary     Status: Abnormal   Collection Time: 10/04/14  5:17 PM  Result Value Ref Range   Glucose-Capillary 343 (H) 65 - 99 mg/dL  Glucose, capillary     Status: Abnormal   Collection Time: 10/04/14  9:57 PM  Result Value Ref Range   Glucose-Capillary 187 (H) 65 - 99 mg/dL  Comprehensive metabolic panel     Status: Abnormal   Collection Time: 10/05/14  5:12 AM  Result Value Ref Range   Sodium 136 135 - 145 mmol/L   Potassium 5.2 (H) 3.5 - 5.1 mmol/L    Comment: DELTA CHECK NOTED NO VISIBLE HEMOLYSIS    Chloride 105 101 - 111 mmol/L   CO2 25 22 - 32 mmol/L   Glucose, Bld 236 (H) 65 - 99 mg/dL   BUN 6 6 - 20 mg/dL   Creatinine, Ser 1.01 0.61 - 1.24 mg/dL   Calcium 8.5 (L) 8.9 - 10.3 mg/dL   Total Protein 5.4 (L) 6.5 - 8.1 g/dL   Albumin 2.6 (L) 3.5 - 5.0 g/dL   AST 14 (L) 15 - 41 U/L   ALT 11 (L) 17 - 63 U/L   Alkaline Phosphatase 58 38 - 126 U/L   Total Bilirubin 0.9 0.3 - 1.2 mg/dL   GFR calc non Af Amer >60 >60 mL/min   GFR calc Af Amer >60 >60 mL/min    Comment: (NOTE) The eGFR has been calculated using the CKD EPI equation. This calculation has not been validated in all clinical situations. eGFR's persistently <60 mL/min signify possible Chronic Kidney Disease.    Anion gap 6  5 - 15  CBC     Status: Abnormal   Collection Time: 10/05/14  5:12 AM  Result Value Ref Range   WBC 3.9 (L) 4.0 - 10.5 K/uL   RBC 3.86 (L) 4.22 - 5.81 MIL/uL   Hemoglobin 12.4 (L) 13.0 - 17.0 g/dL   HCT 36.1 (L) 39.0 - 52.0 %   MCV 93.5 78.0 - 100.0 fL   MCH 32.1 26.0 - 34.0 pg   MCHC 34.3 30.0 - 36.0 g/dL   RDW 13.3 11.5 - 15.5 %   Platelets 179 150 - 400 K/uL  Magnesium      Status: None   Collection Time: 10/05/14  5:12 AM  Result Value Ref Range   Magnesium 1.7 1.7 - 2.4 mg/dL  Glucose, capillary     Status: Abnormal   Collection Time: 10/05/14  7:57 AM  Result Value Ref Range   Glucose-Capillary 234 (H) 65 - 99 mg/dL     Lipid Panel     Component Value Date/Time   CHOL 171 06/26/2010 2057   TRIG 57 06/26/2010 2057   HDL 61 06/26/2010 2057   CHOLHDL 2.8 Ratio 06/26/2010 2057   VLDL 11 06/26/2010 2057   LDLCALC 99 06/26/2010 2057     Lab Results  Component Value Date   HGBA1C 10.3* 10/03/2014   HGBA1C 9.4* 05/13/2013   HGBA1C 11.1* 08/20/2011     Lab Results  Component Value Date   MICROALBUR 1.79 06/26/2010   LDLCALC 99 06/26/2010   CREATININE 1.01 10/05/2014     HPI  Tyler Patterson is a 64 y.o. male with a history of ETOH Abuse and recent relapse who was brought to the ED after he felll to the floor in his home and reportedly hit his head and was witnessed as having convulsions and loss of consciousness, and his bowels and Urine. He was brought to the ED and administered IV Ativan. In the ED he was placed on Seizure precautions and a CT scan of the head was performed and was negative for acute findings. When he became alert he reported that he had began drinking again and has been drinking 8-11 beers daily, and his last drink was on July 4th. His ETOH Level was < 5. He was referred for admission. Per the EDP, Patient had Suicidal Ideation, and was placed on suicide precautions  HOSPITAL COURSE:  Alcohol-induced seizure, patient remained seizure-free during this hospitalization, his last alcohol drink was 4 days ago, no concern for control at this point  Suicidal ideation patient does express suicidal thoughts and has been for about one year,states he has held a gun in his hand to the nurse but never pulled the trigger. Psychiatry consultation is pending, depending on their recommendations the patient will either be discharged  home or committed to be transferred to inpatient psych voluntarily or involuntarily  Hypokalemia hypomagnesemia repleted, recheck magnesium in the outpatient setting  Hypertension-stable  Diabetes mellitus continue outpatient insulin regimen, hemoglobin A1c 10.3  Discharge Exam:    Blood pressure 120/67, pulse 80, temperature 98.4 F (36.9 C), temperature source Oral, resp. rate 18, height 5' 8"  (1.727 m), weight 73.3 kg (161 lb 9.6 oz), SpO2 100 %. CHEST: Normal respiration, clear to auscultation bilaterally HEART: Regular rate and rhythm; no murmurs rubs or gallops BACK: No kyphosis or scoliosis; No CVA tenderness ABDOMEN: Positive Bowel Sounds, Soft Non-Tender, No Rebound or Guarding; No Masses, No Organomegaly. Rectal Exam: Not done EXTREMITIES: No Cyanosis, Clubbing, or Edema; No Ulcerations.  Discharge Instructions    Diet - low sodium heart healthy    Complete by:  As directed      Increase activity slowly    Complete by:  As directed            Follow-up Information    Follow up with AVBUERE,EDWIN A, MD. Schedule an appointment as soon as possible for a visit in 3 days.   Specialty:  Internal Medicine   Contact information:   [place] [address] [phone]       Signed: Reyne Dumas 10/05/2014, 10:50 AM        Time spent >45 mins

## 2014-10-05 NOTE — Consult Note (Signed)
[place] [facility] Consult   Reason for Consult:  Suicidal or depression Referring Physician:  Dr. Murrell Converse Patient Identification: [name]:  [id number] Principal Diagnosis: Alcohol withdrawal seizure Diagnosis:   Patient Active Problem List   Diagnosis Date Noted  . 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 Alcohol abuse [F10.10] 10/04/2014  . Suicidal ideations [R45.851]   . Fall [W19.XXXA] 05/13/2013  . Right sided weakness [M62.89] 05/13/2013  . GERD (gastroesophageal reflux disease) [K21.9] 05/13/2013  . Headache [R51] 05/13/2013  . Syncope [R55] 08/19/2011  . DM (diabetes mellitus) [E11.9] 08/19/2011  . HTN (hypertension) [I10] 08/19/2011    Total Time spent with patient: 1 hour  Subjective:   Tyler [place] is a 64 y.o. male patient admitted with having a seizure after binge drinking for few days upto 6-8 beers a day. He was put on suicide precautions. CT done was negative.  He was interviewed in presence of his wife today  HPI:  Tyler Patterson is cooperative and pleasant. Says he is on disability with limited income and does feel low at times. Says he was not having intent to die he was drinking beer last was July 4th. Says he drinks intermittently for many years . His wife endorsed that there are days he does not drink or upto a week or more. No prior history of seizure or withdrawal delerium.  Denies using other drugs or substances. Denies psychotic symptoms Endorses feeling low because of low income and disability. Wife is supportive and they have grand kids which they like to be with.  Does not endorse hopelessness or suicidal toughts. He does not want to get admitted to psychiatry hospital. Says he would be ok with outpatient referrals. He does understand alcohol can cause depression and make medications not to work. NO clear manic history in past.  HPI Elements:   Location:  depression and alcohol withdrawals. Quality:  moderate. Severity:  feels  low but not hopeless. wants to llive and be with his wife and not in hospital.  Past Medical History:  Past Medical History  Diagnosis Date  . Diabetes mellitus   . Hypertension   . Shortness of breath   . GERD (gastroesophageal reflux disease)   . Headache(784.0)   . Headache 05/13/2013  . Seizures     Past Surgical History  Procedure Laterality Date  . Hernia repair     Family History: History reviewed. No pertinent family history. Social History:  History  Alcohol Use  . Yes    Comment: 11-12 beers a day     History  Drug Use No    History   Social History  . Marital Status: Married    Spouse Name: N/A  . Number of Children: N/A  . Years of Education: N/A   Social History Main Topics  . Smoking status: Current Some Day Smoker -- 1.00 packs/day    Types: Cigars, Cigarettes  . Smokeless tobacco: Never Used     Comment: smokes black & mild cigars on the weekends  . Alcohol Use: Yes     Comment: 11-12 beers a day  . Drug Use: No  . Sexual Activity: Not Currently   Other Topics Concern  . None   Social History Narrative   Additional Social History:    Prescriptions: See PTA list History of alcohol / drug use?: Yes Longest period of sobriety (when/how long): "don't know" Name of Substance 1: Alcohol 1 - Age of First Use: 16 1 - Amount (size/oz):  12 beers and 4-5 drinks of liquor 1 - Frequency: daily 1 - Duration: "long time but I use to drink more than that" 1 - Last Use / Amount: "Monday"                   Allergies:   Allergies  Allergen Reactions  . Other     Unknown medication that he is allergic to. Starts with an Feronal    Labs:  Results for orders placed or performed during the hospital encounter of 10/03/14 (from the past 48 hour(s))  POC CBG, ED     Status: Abnormal   Collection Time: 10/03/14  9:18 PM  Result Value Ref Range   Glucose-Capillary 456 (H) 65 - 99 mg/dL  Hemoglobin A1c     Status: Abnormal   Collection Time:  10/03/14  9:40 PM  Result Value Ref Range   Hgb A1c MFr Bld 10.3 (H) 4.8 - 5.6 %    Comment: (NOTE)         Pre-diabetes: 5.7 - 6.4         Diabetes: >6.4         Glycemic control for adults with diabetes: <7.0    Mean Plasma Glucose 249 mg/dL    Comment: (NOTE) Performed At: [facility] [address] [place], [place] [id number] [place] William F MD Ph:[phone]   Urine rapid drug screen (hosp performed)not at [facility]     Status: None   Collection Time: 10/03/14  9:44 PM  Result Value Ref Range   Opiates NONE DETECTED NONE DETECTED   Cocaine NONE DETECTED NONE DETECTED   Benzodiazepines NONE DETECTED NONE DETECTED   Amphetamines NONE DETECTED NONE DETECTED   Tetrahydrocannabinol NONE DETECTED NONE DETECTED   Barbiturates NONE DETECTED NONE DETECTED    Comment:        DRUG SCREEN FOR MEDICAL PURPOSES ONLY.  IF CONFIRMATION IS NEEDED FOR ANY PURPOSE, NOTIFY LAB WITHIN 5 DAYS.        LOWEST DETECTABLE LIMITS FOR URINE DRUG SCREEN Drug Class       Cutoff (ng/mL) Amphetamine      1000 Barbiturate      200 Benzodiazepine   200 Tricyclics       300 Opiates          300 Cocaine          300 THC              50   Urinalysis, Routine w reflex microscopic (not at [facility])     Status: Abnormal   Collection Time: 10/03/14  9:44 PM  Result Value Ref Range   Color, Urine YELLOW YELLOW   APPearance CLEAR CLEAR   Specific Gravity, Urine 1.021 1.005 - 1.030   pH 7.0 5.0 - 8.0   Glucose, UA >1000 (A) NEGATIVE mg/dL   Hgb urine dipstick TRACE (A) NEGATIVE   Bilirubin Urine NEGATIVE NEGATIVE   Ketones, ur NEGATIVE NEGATIVE mg/dL   Protein, ur 30 (A) NEGATIVE mg/dL   Urobilinogen, UA 0.2 0.0 - 1.0 mg/dL   Nitrite NEGATIVE NEGATIVE   Leukocytes, UA NEGATIVE NEGATIVE  Urine microscopic-add on     Status: None   Collection Time: 10/03/14  9:44 PM  Result Value Ref Range   Squamous Epithelial / LPF RARE RARE   RBC / HPF 0-2 <3 RBC/hpf   Bacteria, UA RARE RARE  Comprehensive  metabolic panel     Status: Abnormal   Collection Time: 10/03/14  9:48 PM    Result Value Ref Range   Sodium 136 135 - 145 mmol/L   Potassium 3.0 (L) 3.5 - 5.1 mmol/L   Chloride 95 (L) 101 - 111 mmol/L   CO2 28 22 - 32 mmol/L   Glucose, Bld 450 (H) 65 - 99 mg/dL   BUN 10 6 - 20 mg/dL   Creatinine, Ser 1.09 0.61 - 1.24 mg/dL   Calcium 8.8 (L) 8.9 - 10.3 mg/dL   Total Protein 6.5 6.5 - 8.1 g/dL   Albumin 3.1 (L) 3.5 - 5.0 g/dL   AST 24 15 - 41 U/L   ALT 15 (L) 17 - 63 U/L   Alkaline Phosphatase 79 38 - 126 U/L   Total Bilirubin 0.7 0.3 - 1.2 mg/dL   GFR calc non Af Amer >60 >60 mL/min   GFR calc Af Amer >60 >60 mL/min    Comment: (NOTE) The eGFR has been calculated using the CKD EPI equation. This calculation has not been validated in all clinical situations. eGFR's persistently <60 mL/min signify possible Chronic Kidney Disease.    Anion gap 13 5 - 15  CBC WITH DIFFERENTIAL     Status: Abnormal   Collection Time: 10/03/14  9:48 PM  Result Value Ref Range   WBC 4.2 4.0 - 10.5 K/uL   RBC 4.03 (L) 4.22 - 5.81 MIL/uL   Hemoglobin 13.4 13.0 - 17.0 g/dL   HCT 37.1 (L) 39.0 - 52.0 %   MCV 92.1 78.0 - 100.0 fL   MCH 33.3 26.0 - 34.0 pg   MCHC 36.1 (H) 30.0 - 36.0 g/dL   RDW 13.1 11.5 - 15.5 %   Platelets 201 150 - 400 K/uL   Neutrophils Relative % 69 43 - 77 %   Neutro Abs 2.9 1.7 - 7.7 K/uL   Lymphocytes Relative 23 12 - 46 %   Lymphs Abs 1.0 0.7 - 4.0 K/uL   Monocytes Relative 7 3 - 12 %   Monocytes Absolute 0.3 0.1 - 1.0 K/uL   Eosinophils Relative 1 0 - 5 %   Eosinophils Absolute 0.0 0.0 - 0.7 K/uL   Basophils Relative 0 0 - 1 %   Basophils Absolute 0.0 0.0 - 0.1 K/uL  Ethanol/ETOH     Status: None   Collection Time: 10/03/14  9:48 PM  Result Value Ref Range   Alcohol, Ethyl (B) <5 <5 mg/dL    Comment:        LOWEST DETECTABLE LIMIT FOR SERUM ALCOHOL IS 5 mg/dL FOR MEDICAL PURPOSES ONLY   Protime-INR     Status: None   Collection Time: 10/03/14  9:48 PM  Result  Value Ref Range   Prothrombin Time 12.5 11.6 - 15.2 seconds   INR 0.91 0.00 - 1.49  POC CBG, ED     Status: Abnormal   Collection Time: 10/03/14 11:37 PM  Result Value Ref Range   Glucose-Capillary 186 (H) 65 - 99 mg/dL   Comment 1 Notify RN   CBG monitoring, ED     Status: Abnormal   Collection Time: 10/04/14  6:13 AM  Result Value Ref Range   Glucose-Capillary 162 (H) 65 - 99 mg/dL  MRSA PCR Screening     Status: None   Collection Time: 10/04/14  6:53 AM  Result Value Ref Range   MRSA by PCR NEGATIVE NEGATIVE    Comment:        The GeneXpert MRSA Assay (FDA approved for NASAL specimens only), is one component of a comprehensive MRSA colonization surveillance  program. It is not intended to diagnose MRSA infection nor to guide or monitor treatment for MRSA infections.   Magnesium     Status: Abnormal   Collection Time: 10/04/14  8:09 AM  Result Value Ref Range   Magnesium 1.3 (L) 1.7 - 2.4 mg/dL  Glucose, capillary     Status: Abnormal   Collection Time: 10/04/14 12:14 PM  Result Value Ref Range   Glucose-Capillary 172 (H) 65 - 99 mg/dL   Comment 1 Notify RN    Comment 2 Document in Chart   Glucose, capillary     Status: Abnormal   Collection Time: 10/04/14  5:17 PM  Result Value Ref Range   Glucose-Capillary 343 (H) 65 - 99 mg/dL  Glucose, capillary     Status: Abnormal   Collection Time: 10/04/14  9:57 PM  Result Value Ref Range   Glucose-Capillary 187 (H) 65 - 99 mg/dL  Comprehensive metabolic panel     Status: Abnormal   Collection Time: 10/05/14  5:12 AM  Result Value Ref Range   Sodium 136 135 - 145 mmol/L   Potassium 5.2 (H) 3.5 - 5.1 mmol/L    Comment: DELTA CHECK NOTED NO VISIBLE HEMOLYSIS    Chloride 105 101 - 111 mmol/L   CO2 25 22 - 32 mmol/L   Glucose, Bld 236 (H) 65 - 99 mg/dL   BUN 6 6 - 20 mg/dL   Creatinine, Ser 1.01 0.61 - 1.24 mg/dL   Calcium 8.5 (L) 8.9 - 10.3 mg/dL   Total Protein 5.4 (L) 6.5 - 8.1 g/dL   Albumin 2.6 (L) 3.5 - 5.0 g/dL    AST 14 (L) 15 - 41 U/L   ALT 11 (L) 17 - 63 U/L   Alkaline Phosphatase 58 38 - 126 U/L   Total Bilirubin 0.9 0.3 - 1.2 mg/dL   GFR calc non Af Amer >60 >60 mL/min   GFR calc Af Amer >60 >60 mL/min    Comment: (NOTE) The eGFR has been calculated using the CKD EPI equation. This calculation has not been validated in all clinical situations. eGFR's persistently <60 mL/min signify possible Chronic Kidney Disease.    Anion gap 6 5 - 15  CBC     Status: Abnormal   Collection Time: 10/05/14  5:12 AM  Result Value Ref Range   WBC 3.9 (L) 4.0 - 10.5 K/uL   RBC 3.86 (L) 4.22 - 5.81 MIL/uL   Hemoglobin 12.4 (L) 13.0 - 17.0 g/dL   HCT 36.1 (L) 39.0 - 52.0 %   MCV 93.5 78.0 - 100.0 fL   MCH 32.1 26.0 - 34.0 pg   MCHC 34.3 30.0 - 36.0 g/dL   RDW 13.3 11.5 - 15.5 %   Platelets 179 150 - 400 K/uL  Magnesium     Status: None   Collection Time: 10/05/14  5:12 AM  Result Value Ref Range   Magnesium 1.7 1.7 - 2.4 mg/dL  Glucose, capillary     Status: Abnormal   Collection Time: 10/05/14  7:57 AM  Result Value Ref Range   Glucose-Capillary 234 (H) 65 - 99 mg/dL    Vitals: Blood pressure 135/80, pulse 73, temperature 98.4 F (36.9 C), temperature source Oral, resp. rate 18, height 5' 8" (1.727 m), weight 73.3 kg (161 lb 9.6 oz), SpO2 100 %.  Risk to Self: Suicidal Ideation: Yes-Currently Present Suicidal Intent: Yes-Currently Present Is patient at risk for suicide?: Yes Suicidal Plan?: Yes-Currently Present Access to Means: Yes Specify Access to Suicidal  Means: access to gun What has been your use of drugs/alcohol within the last 12 months?: daily alcohol use How many times?: 5 Other Self Harm Risks: none noted Triggers for Past Attempts: Other (Comment) (declining health & aging) Intentional Self Injurious Behavior: None Risk to Others: Homicidal Ideation: No (denies) Thoughts of Harm to Others: No (denies) Current Homicidal Intent: No (denies) Current Homicidal Plan: No  (denies) Access to Homicidal Means: Yes Describe Access to Homicidal Means: access to a gun Identified Victim: na History of harm to others?: No (denies) Assessment of Violence: None Noted Violent Behavior Description: na Does patient have access to weapons?: Yes (Comment) Criminal Charges Pending?: No Does patient have a court date: No Prior Inpatient Therapy: Prior Inpatient Therapy: No (denies) Prior Therapy Dates: na Prior Therapy Facilty/Provider(s): na Reason for Treatment: na Prior Outpatient Therapy: Prior Outpatient Therapy: No Prior Therapy Dates: na Prior Therapy Facilty/Provider(s): na Reason for Treatment: na Does patient have an ACCT team?: No Does patient have Intensive In-House Services?  : No Does patient have [facility] services? : No Does patient have [facility] services?: No  Current Facility-Administered Medications  Medication Dose Route Frequency Provider Last Rate Last Dose  . acetaminophen (TYLENOL) tablet 650 mg  650 mg Oral Q6H PRN Harvette C Jenkins, MD       Or  . acetaminophen (TYLENOL) suppository 650 mg  650 mg Rectal Q6H PRN Harvette C Jenkins, MD      . alum & mag hydroxide-simeth (MAALOX/MYLANTA) 200-200-20 MG/5ML suspension 30 mL  30 mL Oral Q6H PRN Harvette C Jenkins, MD      . carvedilol (COREG) tablet 25 mg  25 mg Oral BID WC Nayana Abrol, MD   25 mg at 10/05/14 0809  . folic acid (FOLVITE) tablet 1 mg  1 mg Oral Daily Nayana Abrol, MD   1 mg at 10/05/14 0954  . HYDROmorphone (DILAUDID) injection 0.5-1 mg  0.5-1 mg Intravenous Q3H PRN Harvette C Jenkins, MD      . insulin aspart (novoLOG) injection 0-9 Units  0-9 Units Subcutaneous TID WC Nayana Abrol, MD   9 Units at 10/05/14 1210  . lisinopril (PRINIVIL,ZESTRIL) tablet 40 mg  40 mg Oral Daily Nayana Abrol, MD   40 mg at 10/05/14 0954  . LORazepam (ATIVAN) injection 2-3 mg  2-3 mg Intravenous Q1H PRN Harvette C Jenkins, MD      . magnesium sulfate 3 g in dextrose 5 % 100 mL IVPB  3 g Intravenous Once  Nayana Abrol, MD      . ondansetron (ZOFRAN) tablet 4 mg  4 mg Oral Q6H PRN Harvette C Jenkins, MD       Or  . ondansetron (ZOFRAN) injection 4 mg  4 mg Intravenous Q6H PRN Harvette C Jenkins, MD      . pantoprazole (PROTONIX) EC tablet 40 mg  40 mg Oral Daily Nayana Abrol, MD   40 mg at 10/05/14 0954  . thiamine (VITAMIN B-1) tablet 100 mg  100 mg Oral Daily Jennifer Piepenbrink, PA-C   100 mg at 10/05/14 0954    Musculoskeletal: Strength & Muscle Tone: decreased Gait & Station: lying in bed. see medical notes Patient leans: N/A  Psychiatric Specialty Exam: Physical Exam  Constitutional: He appears well-developed and well-nourished.    Review of Systems  Cardiovascular: Negative for chest pain.  Neurological: Negative for tremors.  Psychiatric/Behavioral: Positive for depression. Negative for suicidal ideas and hallucinations.    Blood pressure 135/80, pulse 73, temperature 98.4 F (36.9 C),   temperature source Oral, resp. rate 18, height 5' 8" (1.727 m), weight 73.3 kg (161 lb 9.6 oz), SpO2 100 %.Body mass index is 24.58 kg/(m^2).  General Appearance: Casual  Eye Contact::  Fair  Speech:  Slow  Volume:  Normal  Mood:  Dysphoric but not hopeless or suicidal  Affect:  Congruent  Thought Process:  Coherent  Orientation:  Full (Time, Place, and Person)  Thought Content:  Rumination  Suicidal Thoughts:  No  Homicidal Thoughts:  No  Memory:  Immediate;   Fair Recent;   Fair  Judgement:  Fair  Insight:  Shallow  Psychomotor Activity:  Normal  Concentration:  Fair  Recall:  Fair  Fund of Knowledge:Fair  Language: Fair  Akathisia:  Negative  Handed:  Right  AIMS (if indicated):     Assets:  Communication Skills Desire for Improvement  ADL's:  Intact  Cognition: WNL  Sleep:      Medical Decision Making: Review or order clinical lab tests (1), Review of Last Therapy Session (1), Review of Medication Regimen & Side Effects (2) and Review of New Medication or Change in Dosage  (2)  Treatment Plan Summary: Plan as follows. Is currently on seizure or alcohol withdrawal protocol.  Patient can be followed outpatient with psychiatry Call [facility] to make appointment [phone] , or call social services to give outpatient referrals for Alcohol support groups and rehab programs Start low dose zoloft 50mg for depression. Patient fairly oriented and alert. Denies suicidal toughts.  Can remove sitter.  Wife agrees for outpatient follow up.   Plan:  No evidence of imminent risk to self or others at present.   Patient does not meet criteria for psychiatric inpatient admission. Supportive therapy provided about ongoing stressors. Discussed crisis plan, support from social network, calling 911, coming to the Emergency Department, and calling Suicide Hotline. Disposition: Outpatient follow up with psychiatry when medically cleared and no risk for seizure/withdrawals.  AKHTAR, NADEEM 10/05/2014 4:07 PM  

## 2014-10-05 NOTE — Progress Notes (Signed)
Results for BREYEN, DEVITT (MRN [id number]) as of 10/05/2014 14:21  Ref. Range 10/04/2014 06:13 10/04/2014 12:14 10/04/2014 17:17 10/04/2014 21:57 10/05/2014 07:57  Glucose-Capillary Latest Ref Range: 65-99 mg/dL 162 (H) 172 (H) 343 (H) 187 (H) 234 (H)  CBGs seem to be trending up after meals. Recommend adding Novolog 3 units TID as meal coverage if patient eats greater than 50% of meal and the postprandial blood sugars continue to be greater than 180 mg/dl. Harvel Ricks RN BSN CDE

## 2014-10-24 ENCOUNTER — Encounter (HOSPITAL_COMMUNITY): Payer: Self-pay | Admitting: Emergency Medicine

## 2014-10-24 ENCOUNTER — Emergency Department (HOSPITAL_COMMUNITY)
Admission: EM | Admit: 2014-10-24 | Discharge: 2014-10-24 | Disposition: A | Discharge status: Home / Self Care | Payer: PPO | Attending: Emergency Medicine | Admitting: Emergency Medicine

## 2014-10-24 DIAGNOSIS — K219 Gastro-esophageal reflux disease without esophagitis: Secondary | ICD-10-CM | POA: Insufficient documentation

## 2014-10-24 DIAGNOSIS — G40909 Epilepsy, unspecified, not intractable, without status epilepticus: Secondary | ICD-10-CM | POA: Insufficient documentation

## 2014-10-24 DIAGNOSIS — R4182 Altered mental status, unspecified: Secondary | ICD-10-CM | POA: Insufficient documentation

## 2014-10-24 DIAGNOSIS — Z79899 Other long term (current) drug therapy: Secondary | ICD-10-CM | POA: Diagnosis not present

## 2014-10-24 DIAGNOSIS — Z794 Long term (current) use of insulin: Secondary | ICD-10-CM | POA: Insufficient documentation

## 2014-10-24 DIAGNOSIS — Z720 Tobacco use: Secondary | ICD-10-CM | POA: Insufficient documentation

## 2014-10-24 DIAGNOSIS — I10 Essential (primary) hypertension: Secondary | ICD-10-CM | POA: Diagnosis not present

## 2014-10-24 DIAGNOSIS — E11649 Type 2 diabetes mellitus with hypoglycemia without coma: Secondary | ICD-10-CM | POA: Diagnosis present

## 2014-10-24 DIAGNOSIS — R569 Unspecified convulsions: Secondary | ICD-10-CM

## 2014-10-24 DIAGNOSIS — E162 Hypoglycemia, unspecified: Secondary | ICD-10-CM

## 2014-10-24 LAB — URINALYSIS, ROUTINE W REFLEX MICROSCOPIC
Bilirubin Urine: NEGATIVE
Glucose, UA: 250 mg/dL — AB
KETONES UR: NEGATIVE mg/dL
LEUKOCYTES UA: NEGATIVE
Nitrite: NEGATIVE
Protein, ur: 100 mg/dL — AB
Specific Gravity, Urine: 1.008 (ref 1.005–1.030)
UROBILINOGEN UA: 0.2 mg/dL (ref 0.0–1.0)
pH: 7 (ref 5.0–8.0)

## 2014-10-24 LAB — COMPREHENSIVE METABOLIC PANEL
ALT: 13 U/L — ABNORMAL LOW (ref 17–63)
AST: 19 U/L (ref 15–41)
Albumin: 3.3 g/dL — ABNORMAL LOW (ref 3.5–5.0)
Alkaline Phosphatase: 62 U/L (ref 38–126)
Anion gap: 8 (ref 5–15)
BILIRUBIN TOTAL: 0.7 mg/dL (ref 0.3–1.2)
BUN: 12 mg/dL (ref 6–20)
CO2: 27 mmol/L (ref 22–32)
CREATININE: 1.16 mg/dL (ref 0.61–1.24)
Calcium: 9.2 mg/dL (ref 8.9–10.3)
Chloride: 104 mmol/L (ref 101–111)
Glucose, Bld: 92 mg/dL (ref 65–99)
POTASSIUM: 4.4 mmol/L (ref 3.5–5.1)
SODIUM: 139 mmol/L (ref 135–145)
Total Protein: 6.6 g/dL (ref 6.5–8.1)

## 2014-10-24 LAB — CBG MONITORING, ED
GLUCOSE-CAPILLARY: 90 mg/dL (ref 65–99)
Glucose-Capillary: 80 mg/dL (ref 65–99)
Glucose-Capillary: 77 mg/dL (ref 65–99)

## 2014-10-24 LAB — CBC
HCT: 40.4 % (ref 39.0–52.0)
HEMOGLOBIN: 13.7 g/dL (ref 13.0–17.0)
MCH: 32.4 pg (ref 26.0–34.0)
MCHC: 33.9 g/dL (ref 30.0–36.0)
MCV: 95.5 fL (ref 78.0–100.0)
Platelets: 187 10*3/uL (ref 150–400)
RBC: 4.23 MIL/uL (ref 4.22–5.81)
RDW: 13.7 % (ref 11.5–15.5)
WBC: 5.5 10*3/uL (ref 4.0–10.5)

## 2014-10-24 LAB — URINE MICROSCOPIC-ADD ON

## 2014-10-24 NOTE — ED Notes (Signed)
Pt presents via [facility] for AMS per family; family reports finding patient lying on couch grasping at air and non-verbal with loss of bowel/bladder at 0030 tonight; pt CAOx4 presently; pt reports recently stopping ETOH on Friday; only seizure hx was one month ago-no medications for it; pt also hypertensive with EMS; EMS reports initial CBG 20 then up to 160 after one amp D50 given IV on scene; EMS reports negative MEND exam; pt reports medication compliance and took 50units lantus before bed

## 2014-10-24 NOTE — ED Notes (Signed)
Pt given ice water, crackers and peanut butter

## 2014-10-24 NOTE — ED Provider Notes (Signed)
TIME SEEN: 4:10 AM  CHIEF COMPLAINT: Hypoglycemia, seizure  HPI: Pt is a 64 y.o. insulin-dependent diabetic with history of hypertension, alcohol use who presents to the emergency department after he was found altered and unresponsive by his wife this evening. She states that he was slumped over on the couch and mumbling. She states she went to take a shower and then heard him "gurgling" and then found him on the floor and a "stiffened" position. No bowel or bladder incontinence. With EMS his blood glucose was found to be 20. Given D50 and his glucose and mentation improved. He is now at his baseline without any complaints. Denies any recent infectious symptoms. It appears he may be confused on how to take his Lantus and NovoLog. States he is taking his NovoLog once a day and his Lantus 3 times a day. States he's been eating and drinking normally.  ROS: See HPI Constitutional: no fever  Eyes: no drainage  ENT: no runny nose   Cardiovascular:  no chest pain  Resp: no SOB  GI: no vomiting GU: no dysuria Integumentary: no rash  Allergy: no hives  Musculoskeletal: no leg swelling  Neurological: no slurred speech ROS otherwise negative  PAST MEDICAL HISTORY/PAST SURGICAL HISTORY:  Past Medical History  Diagnosis Date  . Diabetes mellitus   . Hypertension   . Shortness of breath   . GERD (gastroesophageal reflux disease)   . Headache(784.0)   . Headache 05/13/2013  . Seizures     MEDICATIONS:  Prior to Admission medications   Medication Sig Start Date End Date Taking? Authorizing Provider  alum & mag hydroxide-simeth (MAALOX/MYLANTA) 200-200-20 MG/5ML suspension Take 30 mLs by mouth every 6 (six) hours as needed for indigestion or heartburn (dyspepsia). 10/05/14   Reyne Dumas, MD  baclofen (LIORESAL) 10 MG tablet Take 10 mg by mouth 2 (two) times daily as needed for muscle spasms.    Historical Provider, MD  carvedilol (COREG) 12.5 MG tablet Take 25 mg by mouth 2 (two) times daily with a  meal.     Historical Provider, MD  cyanocobalamin 1000 MCG tablet Take 1,000 mcg by mouth daily.     Historical Provider, MD  cyclobenzaprine (FLEXERIL) 10 MG tablet Take 1 tablet (10 mg total) by mouth 2 (two) times daily as needed for muscle spasms. 05/15/13   Ripudeep Krystal Eaton, MD  docusate sodium 100 MG CAPS Take 100 mg by mouth 2 (two) times daily as needed for mild constipation. 05/15/13   Ripudeep Krystal Eaton, MD  DULoxetine (CYMBALTA) 60 MG capsule Take 60 mg by mouth at bedtime.    Historical Provider, MD  folic acid (FOLVITE) 1 MG tablet Take 1 mg by mouth daily.    Historical Provider, MD  gabapentin (NEURONTIN) 100 MG capsule Take 100 mg by mouth 3 (three) times daily.    Historical Provider, MD  insulin glargine (LANTUS) 100 UNIT/ML injection Inject 35 Units into the skin every evening.  08/21/11   Monika Salk, MD  insulin regular (NOVOLIN R,HUMULIN R) 100 units/mL injection Inject 15 Units into the skin 2 (two) times daily before a meal.    Historical Provider, MD  lisinopril (PRINIVIL,ZESTRIL) 40 MG tablet Take 40 mg by mouth daily.    Historical Provider, MD  magnesium oxide (MAG-OX) 400 MG tablet Take 1 tablet (400 mg total) by mouth 2 (two) times daily. 10/05/14   Reyne Dumas, MD  metFORMIN (GLUCOPHAGE) 1000 MG tablet Take 1,000 mg by mouth 2 (two) times daily with  a meal.    Historical Provider, MD  pantoprazole (PROTONIX) 40 MG tablet Take 1 tablet (40 mg total) by mouth daily. 05/15/13   Ripudeep Krystal Eaton, MD  sertraline (ZOLOFT) 25 MG tablet Take 1 tablet (25 mg total) by mouth daily. 10/05/14 10/05/15  Reyne Dumas, MD  thiamine 100 MG tablet Take 1 tablet (100 mg total) by mouth daily. 10/05/14   Reyne Dumas, MD    ALLERGIES:  Allergies  Allergen Reactions  . Other     Unknown medication that he is allergic to. Starts with an [place]    SOCIAL HISTORY:  History  Substance Use Topics  . Smoking status: Current Some Day Smoker -- 1.00 packs/day    Types: Cigars, Cigarettes  . Smokeless  tobacco: Never Used     Comment: smokes black & mild cigars on the weekends  . Alcohol Use: No     Comment: 11-12 beers a day; pt states quit drinking on 10/19/14    FAMILY HISTORY: History reviewed. No pertinent family history.  EXAM: BP 167/79 mmHg  Pulse 64  Resp 14  SpO2 99% CONSTITUTIONAL: Alert and oriented and responds appropriately to questions. Well-appearing; well-nourished HEAD: Normocephalic EYES: Conjunctivae clear, PERRL ENT: normal nose; no rhinorrhea; moist mucous membranes; pharynx without lesions noted NECK: Supple, no meningismus, no LAD  CARD: RRR; S1 and S2 appreciated; no murmurs, no clicks, no rubs, no gallops RESP: Normal chest excursion without splinting or tachypnea; breath sounds clear and equal bilaterally; no wheezes, no rhonchi, no rales, no hypoxia or respiratory distress, speaking full sentences ABD/GI: Normal bowel sounds; non-distended; soft, non-tender, no rebound, no guarding, no peritoneal signs BACK:  The back appears normal and is non-tender to palpation, there is no CVA tenderness EXT: Normal ROM in all joints; non-tender to palpation; no edema; normal capillary refill; no cyanosis, no calf tenderness or swelling    SKIN: Normal color for age and race; warm NEURO: Moves all extremities equally, sensation to light touch intact diffusely, cranial nerves II through XII intact PSYCH: The patient's mood and manner are appropriate. Grooming and personal hygiene are appropriate.  MEDICAL DECISION MAKING: Patient here with seizure likely secondary to hypoglycemia. He does report a history of heavy alcohol use but wife reports he has not stopped drinking and has drank alcohol today. Does not appear intoxicated. He has no tremors, hypertension, tachycardia or withdrawal symptoms. Suspect a seizure today was more likely due to hypoglycemia. Labs and urine have been unremarkable. Blood glucose has been stable for several hours. He's been eating and drinking  well. Have advised him to contact his primary care physician's office this morning to clarify with the dose of his insulin is and which medications he should be taking when. Have discussed strict return precautions. He verbalizes understanding and is comfortable with plan. Have also recommended alcohol cessation.      [place], DO 10/24/14 [phone]

## 2014-10-24 NOTE — Discharge Instructions (Signed)
Blood Glucose Monitoring °Monitoring your blood glucose (also know as blood sugar) helps you to manage your diabetes. It also helps you and your health care provider monitor your diabetes and determine how well your treatment plan is working. °WHY SHOULD YOU MONITOR YOUR BLOOD GLUCOSE? °· It can help you understand how food, exercise, and medicine affect your blood glucose. °· It allows you to know what your blood glucose is at any given moment. You can quickly tell if you are having low blood glucose (hypoglycemia) or high blood glucose (hyperglycemia). °· It can help you and your health care provider know how to adjust your medicines. °· It can help you understand how to manage an illness or adjust medicine for exercise. °WHEN SHOULD YOU TEST? °Your health care provider will help you decide how often you should check your blood glucose. This may depend on the type of diabetes you have, your diabetes control, or the types of medicines you are taking. Be sure to write down all of your blood glucose readings so that this information can be reviewed with your health care provider. See below for examples of testing times that your health care provider may suggest. °Type 1 Diabetes °· Test 4 times a day if you are in good control, using an insulin pump, or perform multiple daily injections. °· If your diabetes is not well controlled or if you are sick, you may need to monitor more often. °· It is a good idea to also monitor: °· Before and after exercise. °· Between meals and 2 hours after a meal. °· Occasionally between 2:00 a.m. and 3:00 a.m. °Type 2 Diabetes °· It can vary with each person, but generally, if you are on insulin, test 4 times a day. °· If you take medicines by mouth (orally), test 2 times a day. °· If you are on a controlled diet, test once a day. °· If your diabetes is not well controlled or if you are sick, you may need to monitor more often. °HOW TO MONITOR YOUR BLOOD GLUCOSE °Supplies  Needed °· Blood glucose meter. °· Test strips for your meter. Each meter has its own strips. You must use the strips that go with your own meter. °· A pricking needle (lancet). °· A device that holds the lancet (lancing device). °· A journal or log book to write down your results. °Procedure °· Wash your hands with soap and water. Alcohol is not preferred. °· Prick the side of your finger (not the tip) with the lancet. °· Gently milk the finger until a small drop of blood appears. °· Follow the instructions that come with your meter for inserting the test strip, applying blood to the strip, and using your blood glucose meter. °Other Areas to Get Blood for Testing °Some meters allow you to use other areas of your body (other than your finger) to test your blood. These areas are called alternative sites. The most common alternative sites are: °· The forearm. °· The thigh. °· The back area of the lower leg. °· The palm of the hand. °The blood flow in these areas is slower. Therefore, the blood glucose values you get may be delayed, and the numbers are different from what you would get from your fingers. Do not use alternative sites if you think you are having hypoglycemia. Your reading will not be accurate. Always use a finger if you are having hypoglycemia. Also, if you cannot feel your lows (hypoglycemia unawareness), always use your fingers for your   blood glucose checks. °ADDITIONAL TIPS FOR GLUCOSE MONITORING °· Do not reuse lancets. °· Always carry your supplies with you. °· All blood glucose meters have a 24-hour "hotline" number to call if you have questions or need help. °· Adjust (calibrate) your blood glucose meter with a control solution after finishing a few boxes of strips. °BLOOD GLUCOSE RECORD KEEPING °It is a good idea to keep a daily record or log of your blood glucose readings. Most glucose meters, if not all, keep your glucose records stored in the meter. Some meters come with the ability to download  your records to your home computer. Keeping a record of your blood glucose readings is especially helpful if you are wanting to look for patterns. Make notes to go along with the blood glucose readings because you might forget what happened at that exact time. Keeping good records helps you and your health care provider to work together to achieve good diabetes management.  °Document Released: 03/19/2003 Document Revised: 07/31/2013 Document Reviewed: 08/08/2012 °ExitCare® Patient Information ©2015 [employer]. This information is not intended to replace advice given to you by your health care provider. Make sure you discuss any questions you have with your health care provider. ° °Hypoglycemia °Hypoglycemia occurs when the glucose in your blood is too low. Glucose is a type of sugar that is your body's main energy source. Hormones, such as insulin and glucagon, control the level of glucose in the blood. Insulin lowers blood glucose and glucagon increases blood glucose. Having too much insulin in your blood stream, or not eating enough food containing sugar, can result in hypoglycemia. Hypoglycemia can happen to people with or without diabetes. It can develop quickly and can be a medical emergency.  °CAUSES  °· Missing or delaying meals. °· Not eating enough carbohydrates at meals. °· Taking too much diabetes medicine. °· Not timing your oral diabetes medicine or insulin doses with meals, snacks, and exercise. °· Nausea and vomiting. °· Certain medicines. °· Severe illnesses, such as hepatitis, kidney disorders, and certain eating disorders. °· Increased activity or exercise without eating something extra or adjusting medicines. °· Drinking too much alcohol. °· A nerve disorder that affects body functions like your heart rate, blood pressure, and digestion (autonomic neuropathy). °· A condition where the stomach muscles do not function properly (gastroparesis). Therefore, medicines and food may not absorb  properly. °· Rarely, a tumor of the pancreas can produce too much insulin. °SYMPTOMS  °· Hunger. °· Sweating (diaphoresis). °· Change in body temperature. °· Shakiness. °· Headache. °· Anxiety. °· Lightheadedness. °· Irritability. °· Difficulty concentrating. °· Dry mouth. °· Tingling or numbness in the hands or feet. °· Restless sleep or sleep disturbances. °· Altered speech and coordination. °· Change in mental status. °· Seizures or prolonged convulsions. °· Combativeness. °· Drowsiness (lethargic). °· Weakness. °· Increased heart rate or palpitations. °· Confusion. °· Pale, gray skin color. °· Blurred or double vision. °· Fainting. °DIAGNOSIS  °A physical exam and medical history will be performed. Your caregiver may make a diagnosis based on your symptoms. Blood tests and other lab tests may be performed to confirm a diagnosis. Once the diagnosis is made, your caregiver will see if your signs and symptoms go away once your blood glucose is raised.  °TREATMENT  °Usually, you can easily treat your hypoglycemia when you notice symptoms. °· Check your blood glucose. If it is less than 70 mg/dl, take one of the following:   °¨ 3-4 glucose tablets.   °¨ ½ cup   juice.   °¨ ½ cup regular soda.   °¨ 1 cup skim milk.   °¨ ½-1 tube of glucose gel.   °¨ 5-6 hard candies.   °· Avoid high-fat drinks or food that may delay a rise in blood glucose levels. °· Do not take more than the recommended amount of sugary foods, drinks, gel, or tablets. Doing so will cause your blood glucose to go too high.   °· Wait 10-15 minutes and recheck your blood glucose. If it is still less than 70 mg/dl or below your target range, repeat treatment.   °· Eat a snack if it is more than 1 hour until your next meal.   °There may be a time when your blood glucose may go so low that you are unable to treat yourself at home when you start to notice symptoms. You may need someone to help you. You may even faint or be unable to swallow. If you cannot  treat yourself, someone will need to bring you to the hospital.  °HOME CARE INSTRUCTIONS °· If you have diabetes, follow your diabetes management plan by: °¨ Taking your medicines as directed. °¨ Following your exercise plan. °¨ Following your meal plan. Do not skip meals. Eat on time. °¨ Testing your blood glucose regularly. Check your blood glucose before and after exercise. If you exercise longer or different than usual, be sure to check blood glucose more frequently. °¨ Wearing your medical alert jewelry that says you have diabetes. °· Identify the cause of your hypoglycemia. Then, develop ways to prevent the recurrence of hypoglycemia. °· Do not take a hot bath or shower right after an insulin shot. °· Always carry treatment with you. Glucose tablets are the easiest to carry. °· If you are going to drink alcohol, drink it only with meals. °· Tell friends or family members ways to keep you safe during a seizure. This may include removing hard or sharp objects from the area or turning you on your side. °· Maintain a healthy weight. °SEEK MEDICAL CARE IF:  °· You are having problems keeping your blood glucose in your target range. °· You are having frequent episodes of hypoglycemia. °· You feel you might be having side effects from your medicines. °· You are not sure why your blood glucose is dropping so low. °· You notice a change in vision or a new problem with your vision. °SEEK IMMEDIATE MEDICAL CARE IF:  °· Confusion develops. °· A change in mental status occurs. °· The inability to swallow develops. °· Fainting occurs. °Document Released: 03/16/2005 Document Revised: 03/21/2013 Document Reviewed: 07/13/2011 °ExitCare® Patient Information ©2015 [employer]. This information is not intended to replace advice given to you by your health care provider. Make sure you discuss any questions you have with your health care provider. ° °

## 2016-08-27 ENCOUNTER — Encounter (HOSPITAL_COMMUNITY): Payer: Self-pay | Admitting: Emergency Medicine

## 2016-08-27 ENCOUNTER — Emergency Department (HOSPITAL_COMMUNITY): Payer: PPO

## 2016-08-27 ENCOUNTER — Observation Stay (HOSPITAL_COMMUNITY): Payer: PPO

## 2016-08-27 ENCOUNTER — Observation Stay (HOSPITAL_COMMUNITY)
Admission: EM | Admit: 2016-08-27 | Discharge: 2016-08-28 | Disposition: A | Discharge status: Home / Self Care | Payer: PPO | Attending: Internal Medicine | Admitting: Internal Medicine

## 2016-08-27 DIAGNOSIS — R569 Unspecified convulsions: Secondary | ICD-10-CM

## 2016-08-27 DIAGNOSIS — F17210 Nicotine dependence, cigarettes, uncomplicated: Secondary | ICD-10-CM | POA: Diagnosis not present

## 2016-08-27 DIAGNOSIS — K219 Gastro-esophageal reflux disease without esophagitis: Secondary | ICD-10-CM | POA: Diagnosis not present

## 2016-08-27 DIAGNOSIS — Z888 Allergy status to other drugs, medicaments and biological substances status: Secondary | ICD-10-CM | POA: Diagnosis not present

## 2016-08-27 DIAGNOSIS — E11649 Type 2 diabetes mellitus with hypoglycemia without coma: Secondary | ICD-10-CM | POA: Diagnosis not present

## 2016-08-27 DIAGNOSIS — F1010 Alcohol abuse, uncomplicated: Secondary | ICD-10-CM | POA: Diagnosis not present

## 2016-08-27 DIAGNOSIS — E162 Hypoglycemia, unspecified: Secondary | ICD-10-CM | POA: Diagnosis not present

## 2016-08-27 DIAGNOSIS — Z794 Long term (current) use of insulin: Secondary | ICD-10-CM | POA: Insufficient documentation

## 2016-08-27 DIAGNOSIS — S199XXA Unspecified injury of neck, initial encounter: Secondary | ICD-10-CM | POA: Diagnosis not present

## 2016-08-27 DIAGNOSIS — I10 Essential (primary) hypertension: Secondary | ICD-10-CM | POA: Diagnosis not present

## 2016-08-27 DIAGNOSIS — E119 Type 2 diabetes mellitus without complications: Secondary | ICD-10-CM | POA: Diagnosis not present

## 2016-08-27 DIAGNOSIS — R739 Hyperglycemia, unspecified: Secondary | ICD-10-CM | POA: Diagnosis not present

## 2016-08-27 DIAGNOSIS — E161 Other hypoglycemia: Secondary | ICD-10-CM | POA: Diagnosis not present

## 2016-08-27 DIAGNOSIS — Z79899 Other long term (current) drug therapy: Secondary | ICD-10-CM | POA: Insufficient documentation

## 2016-08-27 DIAGNOSIS — Z833 Family history of diabetes mellitus: Secondary | ICD-10-CM | POA: Diagnosis not present

## 2016-08-27 DIAGNOSIS — S0990XA Unspecified injury of head, initial encounter: Secondary | ICD-10-CM | POA: Diagnosis not present

## 2016-08-27 DIAGNOSIS — R4020 Unspecified coma: Secondary | ICD-10-CM | POA: Diagnosis not present

## 2016-08-27 HISTORY — DX: Alcohol abuse, uncomplicated: F10.10

## 2016-08-27 LAB — CBC WITH DIFFERENTIAL/PLATELET
BASOS ABS: 0 10*3/uL (ref 0.0–0.1)
BASOS ABS: 0 10*3/uL (ref 0.0–0.1)
Basophils Relative: 0 %
Basophils Relative: 0 %
EOS ABS: 0 10*3/uL (ref 0.0–0.7)
EOS PCT: 0 %
Eosinophils Absolute: 0.1 10*3/uL (ref 0.0–0.7)
Eosinophils Relative: 1 %
HCT: 33 % — ABNORMAL LOW (ref 39.0–52.0)
HCT: 32 % — ABNORMAL LOW (ref 39.0–52.0)
Hemoglobin: 11.4 g/dL — ABNORMAL LOW (ref 13.0–17.0)
Hemoglobin: 10.9 g/dL — ABNORMAL LOW (ref 13.0–17.0)
Lymphocytes Relative: 23 %
Lymphocytes Relative: 25 %
Lymphs Abs: 1.2 10*3/uL (ref 0.7–4.0)
Lymphs Abs: 1.3 10*3/uL (ref 0.7–4.0)
MCH: 31.3 pg (ref 26.0–34.0)
MCH: 31 pg (ref 26.0–34.0)
MCHC: 34.5 g/dL (ref 30.0–36.0)
MCHC: 34.1 g/dL (ref 30.0–36.0)
MCV: 90.7 fL (ref 78.0–100.0)
MCV: 90.9 fL (ref 78.0–100.0)
MONO ABS: 0.4 10*3/uL (ref 0.1–1.0)
Monocytes Absolute: 0.4 10*3/uL (ref 0.1–1.0)
Monocytes Relative: 8 %
Monocytes Relative: 9 %
Neutro Abs: 3.6 10*3/uL (ref 1.7–7.7)
Neutro Abs: 3.3 10*3/uL (ref 1.7–7.7)
Neutrophils Relative %: 68 %
Neutrophils Relative %: 66 %
PLATELETS: 195 10*3/uL (ref 150–400)
Platelets: 187 10*3/uL (ref 150–400)
RBC: 3.64 MIL/uL — ABNORMAL LOW (ref 4.22–5.81)
RBC: 3.52 MIL/uL — AB (ref 4.22–5.81)
RDW: 14.1 % (ref 11.5–15.5)
RDW: 14.1 % (ref 11.5–15.5)
WBC: 5.3 10*3/uL (ref 4.0–10.5)
WBC: 5 10*3/uL (ref 4.0–10.5)

## 2016-08-27 LAB — URINALYSIS, ROUTINE W REFLEX MICROSCOPIC
BILIRUBIN URINE: NEGATIVE
Bacteria, UA: NONE SEEN
GLUCOSE, UA: 150 mg/dL — AB
KETONES UR: NEGATIVE mg/dL
LEUKOCYTES UA: NEGATIVE
NITRITE: NEGATIVE
PROTEIN: NEGATIVE mg/dL
RBC / HPF: NONE SEEN RBC/hpf (ref 0–5)
Specific Gravity, Urine: 1.005 (ref 1.005–1.030)
Squamous Epithelial / LPF: NONE SEEN
pH: 5 (ref 5.0–8.0)

## 2016-08-27 LAB — COMPREHENSIVE METABOLIC PANEL
ALBUMIN: 3.3 g/dL — AB (ref 3.5–5.0)
ALT: 63 U/L (ref 17–63)
ALT: 57 U/L (ref 17–63)
ANION GAP: 10 (ref 5–15)
AST: 70 U/L — ABNORMAL HIGH (ref 15–41)
AST: 56 U/L — ABNORMAL HIGH (ref 15–41)
Albumin: 3.2 g/dL — ABNORMAL LOW (ref 3.5–5.0)
Alkaline Phosphatase: 88 U/L (ref 38–126)
Alkaline Phosphatase: 91 U/L (ref 38–126)
Anion gap: 9 (ref 5–15)
BUN: 27 mg/dL — AB (ref 6–20)
BUN: 26 mg/dL — AB (ref 6–20)
CALCIUM: 8.9 mg/dL (ref 8.9–10.3)
CHLORIDE: 103 mmol/L (ref 101–111)
CO2: 22 mmol/L (ref 22–32)
CO2: 27 mmol/L (ref 22–32)
CREATININE: 1.29 mg/dL — AB (ref 0.61–1.24)
Calcium: 8.8 mg/dL — ABNORMAL LOW (ref 8.9–10.3)
Chloride: 99 mmol/L — ABNORMAL LOW (ref 101–111)
Creatinine, Ser: 1.25 mg/dL — ABNORMAL HIGH (ref 0.61–1.24)
GFR calc Af Amer: >60 mL/min (ref >60)
GFR calc Af Amer: >60 mL/min (ref >60)
GFR, EST NON AFRICAN AMERICAN: 59 mL/min — AB (ref >60)
GFR, EST NON AFRICAN AMERICAN: 57 mL/min — AB (ref >60)
Glucose, Bld: 60 mg/dL — ABNORMAL LOW (ref 65–99)
Glucose, Bld: 187 mg/dL — ABNORMAL HIGH (ref 65–99)
Potassium: 3.4 mmol/L — ABNORMAL LOW (ref 3.5–5.1)
Potassium: 3.6 mmol/L (ref 3.5–5.1)
Sodium: 135 mmol/L (ref 135–145)
Sodium: 135 mmol/L (ref 135–145)
TOTAL PROTEIN: 6.8 g/dL (ref 6.5–8.1)
Total Bilirubin: 0.9 mg/dL (ref 0.3–1.2)
Total Bilirubin: 1.1 mg/dL (ref 0.3–1.2)
Total Protein: 6.6 g/dL (ref 6.5–8.1)

## 2016-08-27 LAB — CBG MONITORING, ED
GLUCOSE-CAPILLARY: 110 mg/dL — AB (ref 65–99)
GLUCOSE-CAPILLARY: 154 mg/dL — AB (ref 65–99)
GLUCOSE-CAPILLARY: 196 mg/dL — AB (ref 65–99)
GLUCOSE-CAPILLARY: 48 mg/dL — AB (ref 65–99)
Glucose-Capillary: 263 mg/dL — ABNORMAL HIGH (ref 65–99)
Glucose-Capillary: 264 mg/dL — ABNORMAL HIGH (ref 65–99)
Glucose-Capillary: 41 mg/dL — CL (ref 65–99)
Glucose-Capillary: 61 mg/dL — ABNORMAL LOW (ref 65–99)
Glucose-Capillary: 64 mg/dL — ABNORMAL LOW (ref 65–99)
Glucose-Capillary: 74 mg/dL (ref 65–99)
Glucose-Capillary: 75 mg/dL (ref 65–99)
Glucose-Capillary: 77 mg/dL (ref 65–99)
Glucose-Capillary: 97 mg/dL (ref 65–99)

## 2016-08-27 LAB — CORTISOL: Cortisol, Plasma: 18.8 ug/dL

## 2016-08-27 LAB — RAPID URINE DRUG SCREEN, HOSP PERFORMED
Amphetamines: NOT DETECTED
Barbiturates: NOT DETECTED
Benzodiazepines: NOT DETECTED
Cocaine: NOT DETECTED
Opiates: NOT DETECTED
Tetrahydrocannabinol: NOT DETECTED

## 2016-08-27 LAB — GLUCOSE, CAPILLARY
GLUCOSE-CAPILLARY: 268 mg/dL — AB (ref 65–99)
GLUCOSE-CAPILLARY: 237 mg/dL — AB (ref 65–99)

## 2016-08-27 LAB — TROPONIN I

## 2016-08-27 LAB — TSH: TSH: 1.208 u[IU]/mL (ref 0.350–4.500)

## 2016-08-27 LAB — MRSA PCR SCREENING: MRSA by PCR: NEGATIVE

## 2016-08-27 MED ORDER — LORAZEPAM 2 MG/ML IJ SOLN: 1.0000 mg | Freq: Four times a day (QID) | INTRAMUSCULAR | Status: DC | PRN

## 2016-08-27 MED ORDER — PANTOPRAZOLE SODIUM 40 MG PO TBEC
40.0000 mg | DELAYED_RELEASE_TABLET | Freq: Every day | ORAL | Status: DC
  Administered 2016-08-27 – 2016-08-28 (×2): 40 mg via ORAL
  Filled 2016-08-27 (×2): qty 1

## 2016-08-27 MED ORDER — BACLOFEN 10 MG PO TABS: 10.0000 mg | ORAL_TABLET | Freq: Two times a day (BID) | ORAL | Status: DC | PRN

## 2016-08-27 MED ORDER — FOLIC ACID 1 MG PO TABS: 1.0000 mg | ORAL_TABLET | Freq: Every day | ORAL | Status: DC

## 2016-08-27 MED ORDER — DEXTROSE 50 % IV SOLN
1.0000 | Freq: Once | INTRAVENOUS | Status: AC
  Administered 2016-08-27: 50 mL via INTRAVENOUS
  Filled 2016-08-27: qty 50

## 2016-08-27 MED ORDER — ACETAMINOPHEN 650 MG RE SUPP: 650.0000 mg | Freq: Four times a day (QID) | RECTAL | Status: DC | PRN

## 2016-08-27 MED ORDER — INSULIN ASPART 100 UNIT/ML ~~LOC~~ SOLN
0.0000 [IU] | Freq: Three times a day (TID) | SUBCUTANEOUS | Status: DC
  Administered 2016-08-27 (×2): 5 [IU] via SUBCUTANEOUS
  Filled 2016-08-27: qty 1

## 2016-08-27 MED ORDER — VITAMIN B-1 100 MG PO TABS
100.0000 mg | ORAL_TABLET | Freq: Every day | ORAL | Status: DC
  Administered 2016-08-27 – 2016-08-28 (×2): 100 mg via ORAL
  Filled 2016-08-27 (×2): qty 1

## 2016-08-27 MED ORDER — ACETAMINOPHEN 325 MG PO TABS: 650.0000 mg | ORAL_TABLET | Freq: Four times a day (QID) | ORAL | Status: DC | PRN

## 2016-08-27 MED ORDER — INSULIN GLARGINE 100 UNIT/ML ~~LOC~~ SOLN
20.0000 [IU] | Freq: Every day | SUBCUTANEOUS | Status: DC
  Administered 2016-08-27: 20 [IU] via SUBCUTANEOUS
  Filled 2016-08-27: qty 0.2

## 2016-08-27 MED ORDER — FOLIC ACID 1 MG PO TABS
1.0000 mg | ORAL_TABLET | Freq: Every day | ORAL | Status: DC
  Administered 2016-08-27 – 2016-08-28 (×2): 1 mg via ORAL
  Filled 2016-08-27 (×2): qty 1

## 2016-08-27 MED ORDER — THIAMINE HCL 100 MG/ML IJ SOLN: 100.0000 mg | Freq: Every day | INTRAMUSCULAR | Status: DC

## 2016-08-27 MED ORDER — DEXTROSE 50 % IV SOLN
25.0000 mL | Freq: Four times a day (QID) | INTRAVENOUS | Status: DC | PRN
  Administered 2016-08-28: 25 mL via INTRAVENOUS
  Filled 2016-08-27: qty 50

## 2016-08-27 MED ORDER — LISINOPRIL 20 MG PO TABS
40.0000 mg | ORAL_TABLET | Freq: Every day | ORAL | Status: DC
  Administered 2016-08-27 – 2016-08-28 (×2): 40 mg via ORAL
  Filled 2016-08-27 (×2): qty 2

## 2016-08-27 MED ORDER — DEXTROSE 50 % IV SOLN
1.0000 | Freq: Once | INTRAVENOUS | Status: AC
  Administered 2016-08-27: 50 mL via INTRAVENOUS

## 2016-08-27 MED ORDER — DEXTROSE 50 % IV SOLN
INTRAVENOUS | Status: AC
  Filled 2016-08-27: qty 50

## 2016-08-27 MED ORDER — ONDANSETRON HCL 4 MG/2ML IJ SOLN: 4.0000 mg | Freq: Four times a day (QID) | INTRAMUSCULAR | Status: DC | PRN

## 2016-08-27 MED ORDER — ONDANSETRON HCL 4 MG PO TABS: 4.0000 mg | ORAL_TABLET | Freq: Four times a day (QID) | ORAL | Status: DC | PRN

## 2016-08-27 MED ORDER — DEXTROSE-NACL 5-0.45 % IV SOLN
INTRAVENOUS | Status: DC
  Administered 2016-08-27: 02:00:00 via INTRAVENOUS

## 2016-08-27 MED ORDER — LORAZEPAM 1 MG PO TABS: 1.0000 mg | ORAL_TABLET | Freq: Four times a day (QID) | ORAL | Status: DC | PRN

## 2016-08-27 MED ORDER — DEXTROSE 5 % IV SOLN
INTRAVENOUS | Status: DC
  Administered 2016-08-27: 08:00:00 via INTRAVENOUS

## 2016-08-27 MED ORDER — GLUCAGON HCL RDNA (DIAGNOSTIC) 1 MG IJ SOLR
1.0000 mg | Freq: Once | INTRAMUSCULAR | Status: AC
  Administered 2016-08-27: 1 mg via INTRAVENOUS
  Filled 2016-08-27: qty 1

## 2016-08-27 MED ORDER — CARVEDILOL 25 MG PO TABS
25.0000 mg | ORAL_TABLET | Freq: Two times a day (BID) | ORAL | Status: DC
  Administered 2016-08-27 – 2016-08-28 (×3): 25 mg via ORAL
  Filled 2016-08-27: qty 2
  Filled 2016-08-27 (×2): qty 1

## 2016-08-27 MED ORDER — ADULT MULTIVITAMIN W/MINERALS CH
1.0000 | ORAL_TABLET | Freq: Every day | ORAL | Status: DC
  Administered 2016-08-27 – 2016-08-28 (×2): 1 via ORAL
  Filled 2016-08-27 (×2): qty 1

## 2016-08-27 MED ORDER — DULOXETINE HCL 60 MG PO CPEP
60.0000 mg | ORAL_CAPSULE | Freq: Every day | ORAL | Status: DC
  Administered 2016-08-27: 60 mg via ORAL
  Filled 2016-08-27: qty 1

## 2016-08-27 NOTE — ED Notes (Signed)
MD at bedside. 

## 2016-08-27 NOTE — ED Provider Notes (Signed)
[facility] DEPT Provider Note   CSN: [id number] Arrival date & time: 08/27/16  0018  By signing my name below, I, Margit Banda, attest that this documentation has been prepared under the direction and in the presence of Jendayi Berling, Annie Main, MD. Electronically Signed: Margit Banda, ED Scribe. 08/27/16. 12:38 AM.  LEVEL V CAVEAT: HPI and ROS limited due to altered mental status.  History   Chief Complaint Chief Complaint  Patient presents with  . Hypoglycemia  . Fall    HPI Tyler Patterson is a 66 y.o. male with a PMHx of DM and HTN who presents to the Emergency Department complaining of sudden unresponsiveness that started shortly before arrival. Per pt's wife, she heard a strange noise and when she went into the room, he was jerking around before falling out of his chair. He hit his head. Pt has had a seizure once before. Per EMS, he was unconscious when they arrived. CBG was 20. Associated sx include bilateral leg edema, diaphoresis, head and neck pain. Drinks ~ 8-9 beers every few days. Last drink was yesterday. He didn't take any insulin on Wednesday, but did on Tuesday. His PCP is at the [place] [facility]. Pt denies CP and abdominal pain.  The history is provided by the patient, the spouse and the EMS personnel. The history is limited by the condition of the patient. No language interpreter was used.    Past Medical History:  Diagnosis Date  . Diabetes mellitus   . GERD (gastroesophageal reflux disease)   . Headache 05/13/2013  . Headache(784.0)   . Hypertension   . Seizures ([place])   . Shortness of breath     Patient Active Problem List   Diagnosis Date Noted  . Alcohol dependence with alcohol-induced mood disorder ([place])   . Alcohol withdrawal seizure ([place]) 10/04/2014  . Alcohol abuse 10/04/2014  . Suicidal ideations   . Fall 05/13/2013  . Right sided weakness 05/13/2013  . GERD (gastroesophageal reflux disease) 05/13/2013  . Headache 05/13/2013  . Syncope 08/19/2011  . DM  (diabetes mellitus) ([place]) 08/19/2011  . HTN (hypertension) 08/19/2011    Past Surgical History:  Procedure Laterality Date  . HERNIA REPAIR         Home Medications    Prior to Admission medications   Medication Sig Start Date End Date Taking? Authorizing Provider  alum & mag hydroxide-simeth (MAALOX/MYLANTA) 200-200-20 MG/5ML suspension Take 30 mLs by mouth every 6 (six) hours as needed for indigestion or heartburn (dyspepsia). Patient not taking: Reported on 10/24/2014 10/05/14   Reyne Dumas, MD  baclofen (LIORESAL) 10 MG tablet Take 10 mg by mouth 2 (two) times daily as needed for muscle spasms.    [provider]  carvedilol (COREG) 12.5 MG tablet Take 25 mg by mouth 2 (two) times daily with a meal.     [provider]  cyanocobalamin 1000 MCG tablet Take 1,000 mcg by mouth daily.     [provider]  DULoxetine (CYMBALTA) 60 MG capsule Take 60 mg by mouth at bedtime.    [provider]  folic acid (FOLVITE) 1 MG tablet Take 1 mg by mouth daily.    [provider]  gabapentin (NEURONTIN) 100 MG capsule Take 100 mg by mouth 3 (three) times daily.    [provider]  insulin glargine (LANTUS) 100 UNIT/ML injection Inject 50 Units into the skin every evening.  08/21/11   Oti, Brantley Stage, MD  insulin regular (NOVOLIN R,HUMULIN R) 100 units/mL injection Inject 15 Units  into the skin 2 (two) times daily before a meal.    [provider]  lisinopril (PRINIVIL,ZESTRIL) 40 MG tablet Take 40 mg by mouth daily.    [provider]  magnesium oxide (MAG-OX) 400 MG tablet Take 1 tablet (400 mg total) by mouth 2 (two) times daily. Patient not taking: Reported on 10/24/2014 10/05/14   Reyne Dumas, MD  metFORMIN (GLUCOPHAGE) 1000 MG tablet Take 1,000 mg by mouth 2 (two) times daily with a meal.    [provider]  pantoprazole (PROTONIX) 40 MG tablet Take 1 tablet (40 mg total) by mouth daily. 05/15/13   Rai, Vernelle Emerald, MD    sertraline (ZOLOFT) 25 MG tablet Take 1 tablet (25 mg total) by mouth daily. Patient not taking: Reported on 10/24/2014 10/05/14 10/05/15  Reyne Dumas, MD  thiamine 100 MG tablet Take 1 tablet (100 mg total) by mouth daily. 10/05/14   Reyne Dumas, MD    Family History No family history on file.  Social History Social History  Substance Use Topics  . Smoking status: Current Some Day Smoker    Packs/day: 1.00    Types: Cigars, Cigarettes  . Smokeless tobacco: Never Used     Comment: smokes black & mild cigars on the weekends  . Alcohol use No     Comment: 11-12 beers a day; pt states quit drinking on 10/19/14     Allergies   Other   Review of Systems Review of Systems  Unable to perform ROS: Mental status change    Physical Exam Updated Vital Signs BP (!) 179/92 (BP Location: Left Arm)   Pulse 67   Temp (!) 96.5 F (35.8 C) (Axillary)   Resp 12   SpO2 100%   Physical Exam  Constitutional: He is oriented to person, place, and time. He appears well-developed and well-nourished. No distress.  Slow to respond.  Ox2.  HENT:  Head: Normocephalic and atraumatic.  Mouth/Throat: Oropharynx is clear and moist. No oropharyngeal exudate.  Eyes: Conjunctivae and EOM are normal. Pupils are equal, round, and reactive to light.  Neck: Normal range of motion. Neck supple.  No meningismus.  Cardiovascular: Normal rate, regular rhythm, normal heart sounds and intact distal pulses.   No murmur heard. Pulmonary/Chest: Effort normal and breath sounds normal. No respiratory distress.  Abdominal: Soft. There is no tenderness. There is no rebound and no guarding.  Musculoskeletal: Normal range of motion. He exhibits no edema or tenderness.  Diffuse paraspinal cervical tenderness. +2 edema to knees.  Neurological: He is alert and oriented to person, place, and time. No cranial nerve deficit. He exhibits normal muscle tone. Coordination normal.   5/5 strength throughout. CN 2-12  intact.Equal grip strength.   Skin: Skin is warm.  Psychiatric: He has a normal mood and affect. His behavior is normal.  Nursing note and vitals reviewed.    ED Treatments / Results  DIAGNOSTIC STUDIES: Oxygen Saturation is 100% on RA, normal by my interpretation.   COORDINATION OF CARE: 12:38 AM-Discussed next steps with pt. Pt verbalized understanding and is agreeable with the plan.   3:20 AM - Pt's sugars are up to 110. If sugars keep dropping he will be admitted.  Labs (all labs ordered are listed, but only abnormal results are displayed) Labs Reviewed  CBG MONITORING, ED - Abnormal; Notable for the following:       Result Value   Glucose-Capillary 61 (*)    All other components within normal limits  CBG MONITORING, ED -  Abnormal; Notable for the following:    Glucose-Capillary 154 (*)    All other components within normal limits  CBC WITH DIFFERENTIAL/PLATELET  COMPREHENSIVE METABOLIC PANEL  TROPONIN I  RAPID URINE DRUG SCREEN, HOSP PERFORMED  URINALYSIS, ROUTINE W REFLEX MICROSCOPIC    EKG  EKG Interpretation  Date/Time:  Thursday Aug 27 2016 00:19:25 EDT Ventricular Rate:  64 PR Interval:    QRS Duration: 104 QT Interval:  447 QTC Calculation: 462 R Axis:   72 Text Interpretation:  Sinus rhythm Anterior infarct, old Minimal ST elevation, inferior leads No significant change was found Confirmed by Ezequiel Essex [phone]) on 08/27/2016 1:02:47 AM       Radiology No results found.  Procedures Procedures (including critical care time)  Medications Ordered in ED Medications  dextrose 50 % solution 50 mL (50 mLs Intravenous Given 08/27/16 0023)     Initial Impression / Assessment and Plan / ED Course  I have reviewed the triage vital signs and the nursing notes.  Pertinent labs & imaging results that were available during my care of the patient were reviewed by me and considered in my medical decision making (see chart for details).     Patient  from home after what appears to be hypoglycemic seizure. Does drink alcohol but no signs of DTs.  Slow to respond, protecting airway and moving all extremities.  States no insulin since 5/29. No evidence of infection.  CT head and C spine negative. Recurrent hypoglycemia requiring multiple boluses of dextrose and dextrose infusion. Meal given.  Mental status improving, though blood suga continues to drop.  Additional dextrose and food given.   Admission d/w Dr. Hal Hope.  CRITICAL CARE Performed by: Ezequiel Essex Total critical care time: 40 minutes Critical care time was exclusive of separately billable procedures and treating other patients. Critical care was necessary to treat or prevent imminent or life-threatening deterioration. Critical care was time spent personally by me on the following activities: development of treatment plan with patient and/or surrogate as well as nursing, discussions with consultants, evaluation of patient's response to treatment, examination of patient, obtaining history from patient or surrogate, ordering and performing treatments and interventions, ordering and review of laboratory studies, ordering and review of radiographic studies, pulse oximetry and re-evaluation of patient's condition.   Final Clinical Impressions(s) / ED Diagnoses   Final diagnoses:  Hypoglycemia    New Prescriptions New Prescriptions   No medications on file  I personally performed the services described in this documentation, which was scribed in my presence. The recorded information has been reviewed and is accurate.    Ezequiel Essex, MD 08/27/16 1754

## 2016-08-27 NOTE — ED Notes (Signed)
Pt's bfast tray arrived

## 2016-08-27 NOTE — ED Notes (Signed)
Oral trauma noted. Family states "he was jerking." Hematoma to L head.

## 2016-08-27 NOTE — ED Triage Notes (Addendum)
Pt arrives via EMS from home, responsive to deep painful stimuli at this time. [place] out of chair. CBG 20 at EMS arrival, diaphoretic and unresponsive. EMS gave 10, CBG up to 89 pta. IV established in the field. Hx diabetes and HTN.

## 2016-08-27 NOTE — ED Notes (Signed)
Ordered carb mod diet lunch tray for pt

## 2016-08-27 NOTE — Progress Notes (Signed)
EEG completed, results pending. 

## 2016-08-27 NOTE — Procedures (Signed)
ELECTROENCEPHALOGRAM REPORT  Date of Study: 08/27/2016  Patient's Name: Tyler Patterson MRN: [id number] Date of Birth: [birth year]  Referring Provider: Rise Patience, MD  Clinical History: 66 year old man with history of diabetes and alcohol abuse presents with witnessed seizure.  Medications: acetaminophen (TYLENOL) baclofen (LIORESAL) tablet 10 mg  carvedilol (COREG) tablet 25 mg  DULoxetine (CYMBALTA) DR capsule 60 mg  folic acid (FOLVITE) tablet 1 mg  lisinopril (PRINIVIL,ZESTRIL) tablet 40 mg  LORazepam (ATIVAN)  multivitamin with minerals tablet 1 tablet  ondansetron (ZOFRAN)  pantoprazole (PROTONIX) EC tablet 40 mg  thiamine (VITAMIN B-1) tablet 100 mg   Technical Summary: A multichannel digital EEG recording measured by the international 10-20 system with electrodes applied with paste and impedances below 5000 ohms performed in our laboratory with EKG monitoring in an awake and drowsy patient.  Hyperventilation was not performed.  Photic stimulation was performed.  The digital EEG was referentially recorded, reformatted, and digitally filtered in a variety of bipolar and referential montages for optimal display.    Description: The patient is awake and drowsy during the recording.  During maximal wakefulness, there is a symmetric, medium voltage 9 Hz posterior dominant rhythm that attenuates with eye opening.  The record is symmetric.  During drowsiness, there is an increase in theta slowing of the background.  Stage 2 sleep was not seen.  Photic stimulation did not elicit any abnormalities.  There were no epileptiform discharges or electrographic seizures seen.    EKG lead was unremarkable.  Impression: This awake and drowsy EEG is normal.    Clinical Correlation: A normal EEG does not exclude a clinical diagnosis of epilepsy.  If further clinical questions remain, prolonged EEG may be helpful.  Clinical correlation is advised.   Metta Clines, DO

## 2016-08-27 NOTE — ED Notes (Signed)
Text page sent to [facility] MD regarding CBG 48

## 2016-08-27 NOTE — Progress Notes (Signed)
@IPLOG         PROGRESS NOTE                                                                                                                                                                                                             Patient Demographics:    Tyler Patterson, is a 66 y.o. male, DOB - Mar 24, 1951, GQQ:[id number]  Admit date - 08/27/2016   Admitting Physician No admitting provider for patient encounter.  Outpatient Primary MD for the patient is [facility] [place]  LOS - 0  Chief Complaint  Patient presents with  . Hypoglycemia  . Fall       Brief Narrative   Tyler Patterson is a 66 y.o. male with history of diabetes mellitus on Lantus insulin, hypertension, alcohol abuse was brought to the ER after patient had a witnessed seizure by patient's wife. Last evening after cooking patient was sitting on the chair when suddenly patient's wife heard some jerking sounds. On checking patient was having generalized tonic-clonic movement with patient slumped to the right side. EMS was called and when CBG was checked it was 30. Patient was given D50 and was brought to the ER.    Subjective:    Tyler Patterson today has, No headache, No chest pain, No abdominal pain - No Nausea, No new weakness tingling or numbness, No Cough - SOB.     Assessment  & Plan :     1.Seizure activity likely induced by hypoglycemia. Family claims that this happened a few years ago as well, CT scan head and MRI brain unremarkable. Treat underlying cause, increase activity, PT eval.  2. Hypoglycemia. History of DM type 2 insulin-dependent. Drop Lantus and insulin dose, monitor, diabetic education.  3. GERD. On PPI.  4. Hypertension. Stable on Coreg and lisinopril.  5. Alcohol abuse. Counseled to quit, CIWA protocol. Last drink 3 days ago. No signs of DTs.    Diet : Diet heart healthy/carb modified Room service appropriate? Yes; Fluid consistency: Thin    Family Communication  :  None  Code Status :   Full  Disposition Plan  :  Home  Consults  :  Neuro  Procedures  :    CT scan head, C-spine, MRI brain. Nonacute.  DVT Prophylaxis  :    SCDs   Lab Results  Component Value Date   PLT 195 08/27/2016    Inpatient Medications  Scheduled Meds: . carvedilol  25 mg Oral BID WC  . DULoxetine  60 mg Oral QHS  .  folic acid  1 mg Oral Daily  . insulin aspart  0-9 Units Subcutaneous TID WC  . insulin glargine  20 Units Subcutaneous QHS  . lisinopril  40 mg Oral Daily  . multivitamin with minerals  1 tablet Oral Daily  . pantoprazole  40 mg Oral Daily  . thiamine  100 mg Oral Daily   Continuous Infusions: PRN Meds:.acetaminophen **OR** [DISCONTINUED] acetaminophen, baclofen, dextrose, LORazepam **OR** LORazepam, ondansetron **OR** ondansetron (ZOFRAN) IV  Antibiotics  :    Anti-infectives    None         Objective:   Vitals:   08/27/16 0545 08/27/16 0700 08/27/16 0900 08/27/16 1000  BP: 136/77 (!) 147/77 (!) 102/58 128/71  Pulse: 70 68 84 79  Resp: 16 19 19 15   Temp:      TempSrc:      SpO2: 100% 100% 100% 100%    Wt Readings from Last 3 Encounters:  10/04/14 73.3 kg (161 lb 9.6 oz)  05/15/13 82.5 kg (181 lb 14.1 oz)  08/20/11 80.3 kg (177 lb)     Intake/Output Summary (Last 24 hours) at 08/27/16 1055 Last data filed at 08/27/16 0303  Gross per 24 hour  Intake                0 ml  Output             1500 ml  Net            -1500 ml     Physical Exam  Awake Alert, Oriented X 3, No new F.N deficits, Normal affect [place].AT,PERRAL Supple Neck,No JVD, No cervical lymphadenopathy appriciated.  Symmetrical Chest wall movement, Good air movement bilaterally, CTAB RRR,No Gallops,Rubs or new Murmurs, No Parasternal Heave +ve B.Sounds, Abd Soft, No tenderness, No organomegaly appriciated, No rebound - guarding or rigidity. No Cyanosis, Clubbing or edema, No new Rash or bruise     Data Review:    CBC  Recent Labs Lab 08/27/16 0140 08/27/16 0753  WBC 5.3  5.0  HGB 11.4* 10.9*  HCT 33.0* 32.0*  PLT 187 195  MCV 90.7 90.9  MCH 31.3 31.0  MCHC 34.5 34.1  RDW 14.1 14.1  LYMPHSABS 1.2 1.3  MONOABS 0.4 0.4  EOSABS 0.1 0.0  BASOSABS 0.0 0.0    Chemistries   Recent Labs Lab 08/27/16 0140 08/27/16 0753  NA 135 135  K 3.4* 3.6  CL 103 99*  CO2 22 27  GLUCOSE 60* 187*  BUN 27* 26*  CREATININE 1.25* 1.29*  CALCIUM 8.8* 8.9  AST 70* 56*  ALT 63 57  ALKPHOS 88 91  BILITOT 0.9 1.1   ------------------------------------------------------------------------------------------------------------------ No results for input(s): CHOL, HDL, LDLCALC, TRIG, CHOLHDL, LDLDIRECT in the last 72 hours.  Lab Results  Component Value Date   HGBA1C 10.3 (H) 10/03/2014   ------------------------------------------------------------------------------------------------------------------  Recent Labs  08/27/16 0753  TSH 1.208   ------------------------------------------------------------------------------------------------------------------ No results for input(s): VITAMINB12, FOLATE, FERRITIN, TIBC, IRON, RETICCTPCT in the last 72 hours.  Coagulation profile No results for input(s): INR, PROTIME in the last 168 hours.  No results for input(s): DDIMER in the last 72 hours.  Cardiac Enzymes  Recent Labs Lab 08/27/16 0140  TROPONINI <0.03   ------------------------------------------------------------------------------------------------------------------ No results found for: BNP  Micro Results No results found for this or any previous visit (from the past 240 hour(s)).  Radiology Reports Dg Chest 2 View  Result Date: 08/27/2016 CLINICAL DATA:  Unresponsive.  History of diabetes and hypertension. EXAM: CHEST  2  VIEW COMPARISON:  Thoracic spine radiographs May 15, 2013 FINDINGS: Cardiomediastinal silhouette is normal. No pleural effusions or focal consolidations. Trachea projects midline and there is no pneumothorax. Soft tissue planes  and included osseous structures are non-suspicious. IMPRESSION: No acute cardiopulmonary process. Electronically Signed   By: Elon Alas M.D.   On: 08/27/2016 02:06   Ct Head Wo Contrast  Result Date: 08/27/2016 CLINICAL DATA:  66 year old male with fall.  Altered mental status. EXAM: CT HEAD WITHOUT CONTRAST CT CERVICAL SPINE WITHOUT CONTRAST TECHNIQUE: Multidetector CT imaging of the head and cervical spine was performed following the standard protocol without intravenous contrast. Multiplanar CT image reconstructions of the cervical spine were also generated. COMPARISON:  Head CT dated 10/03/2014 FINDINGS: CT HEAD FINDINGS Brain: There is moderate age-related atrophy and chronic microvascular ischemic changes. There is no acute intracranial hemorrhage. No mass effect or midline shift noted. No extra-axial fluid collection. Vascular: No hyperdense vessel or unexpected calcification. Skull: Normal. Negative for fracture or focal lesion. Sinuses/Orbits: There is diffuse mucoperiosteal thickening of paranasal sinuses. No air-fluid levels. The mastoid air cells are clear. Other: None CT CERVICAL SPINE FINDINGS Alignment: Normal. Skull base and vertebrae: No acute fracture. No primary bone lesion or focal pathologic process. Soft tissues and spinal canal: No prevertebral fluid or swelling. No visible canal hematoma. Disc levels:  No acute findings.  Mild degenerative changes. Upper chest: Negative. Other: None IMPRESSION: 1. No acute intracranial hemorrhage. Moderate age-related atrophy and chronic microvascular ischemic changes. If symptoms persist, and there are no contraindications, MRI may provide better evaluation if clinically indicated. 2. No acute/traumatic cervical spine pathology. Electronically Signed   By: Anner Crete M.D.   On: 08/27/2016 01:50   Ct Cervical Spine Wo Contrast  Result Date: 08/27/2016 CLINICAL DATA:  66 year old male with fall.  Altered mental status. EXAM: CT HEAD  WITHOUT CONTRAST CT CERVICAL SPINE WITHOUT CONTRAST TECHNIQUE: Multidetector CT imaging of the head and cervical spine was performed following the standard protocol without intravenous contrast. Multiplanar CT image reconstructions of the cervical spine were also generated. COMPARISON:  Head CT dated 10/03/2014 FINDINGS: CT HEAD FINDINGS Brain: There is moderate age-related atrophy and chronic microvascular ischemic changes. There is no acute intracranial hemorrhage. No mass effect or midline shift noted. No extra-axial fluid collection. Vascular: No hyperdense vessel or unexpected calcification. Skull: Normal. Negative for fracture or focal lesion. Sinuses/Orbits: There is diffuse mucoperiosteal thickening of paranasal sinuses. No air-fluid levels. The mastoid air cells are clear. Other: None CT CERVICAL SPINE FINDINGS Alignment: Normal. Skull base and vertebrae: No acute fracture. No primary bone lesion or focal pathologic process. Soft tissues and spinal canal: No prevertebral fluid or swelling. No visible canal hematoma. Disc levels:  No acute findings.  Mild degenerative changes. Upper chest: Negative. Other: None IMPRESSION: 1. No acute intracranial hemorrhage. Moderate age-related atrophy and chronic microvascular ischemic changes. If symptoms persist, and there are no contraindications, MRI may provide better evaluation if clinically indicated. 2. No acute/traumatic cervical spine pathology. Electronically Signed   By: Anner Crete M.D.   On: 08/27/2016 01:50   Mr Brain Wo Contrast  Result Date: 08/27/2016 CLINICAL DATA:  Seizure like symptoms, unresponsive. History of diabetes, hypertension and seizures. EXAM: MRI HEAD WITHOUT CONTRAST TECHNIQUE: Multiplanar, multiecho pulse sequences of the brain and surrounding structures were obtained without intravenous contrast. COMPARISON:  CT HEAD Aug 27, 2016 at 0115 hours and MRI of the head May 04, 2013 FINDINGS: BRAIN: No reduced diffusion to suggest  acute ischemia. No  susceptibility artifact to suggest hemorrhage. Moderate ventriculomegaly on the basis of global parenchymal brain volume loss. Confluent supratentorial patchy pontine white matter FLAIR T2 hyperintensities. Old small LEFT cerebellar infarct. Old RIGHT thalamus and bilateral basal ganglia lacunar infarcts though new from 2015. No midline shift, mass effect or masses. No abnormal extra-axial fluid collections. VASCULAR: Normal major intracranial vascular flow voids present at skull base. SKULL AND UPPER CERVICAL SPINE: No abnormal sellar expansion. No suspicious calvarial bone marrow signal. Craniocervical junction maintained. SINUSES/ORBITS: Mild to moderate paranasal sinus mucosal thickening. Mastoid air cells are well aerated. The included ocular globes and orbital contents are non-suspicious. Status post bilateral ocular lens implants. OTHER: None. IMPRESSION: No acute intracranial process. Moderate atrophy, advanced for age and progressed from prior MRI. Worsening, severe chronic small vessel ischemic disease. 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 Electronically Signed   By: Elon Alas M.D.   On: 08/27/2016 06:50    Time Spent in minutes  30   Lala Lund M.D on 08/27/2016 at 10:55 AM  Between 7am to 7pm - Pager - [phone] ( page via [email or web address], text pages only, please mention full 10 digit call back number). After 7pm go to [email or web address] - password [facility]

## 2016-08-27 NOTE — H&P (Signed)
History and Physical    Tyler Patterson CBJ:[id number] DOB: 1950/07/22 DOA: 08/27/2016  PCP: [facility] [place]  Patient coming from: Home.  Chief Complaint: Seizures.  HPI: Tyler Patterson is a 66 y.o. male with history of diabetes mellitus on Lantus insulin, hypertension, alcohol abuse was brought to the ER after patient had a witnessed seizure by patient's wife. Last evening after cooking patient was sitting on the chair when suddenly patient's wife heard some jerking sounds. On checking patient was having generalized tonic-clonic movement with patient slumped to the right side. EMS was called and when CBG was checked it was 30. Patient was given D50 and was brought to the ER.   ED Course: On arrival patient regained full consciousness. But patient does not remember being brought to the ER. Patient appears nonfocal. Patient's blood sugar tend to remain low despite 2 more doses of D50 was given. Patient states he has taken his regular dose of Lantus night before. Has been eating well. And his last drink of alcohol was 2 days ago. Patient states he has not been trying to cut off the alcohol. CT of the head and C-spine was unremarkable. Patient is being admitted for hypoglycemia and seizure likely from hypoglycemia.  Review of Systems: As per HPI, rest all negative.   Past Medical History:  Diagnosis Date  . Diabetes mellitus   . GERD (gastroesophageal reflux disease)   . Headache 05/13/2013  . Headache(784.0)   . Hypertension   . Seizures ([place])   . Shortness of breath     Past Surgical History:  Procedure Laterality Date  . HERNIA REPAIR       reports that he has been smoking Cigars and Cigarettes.  He has been smoking about 1.00 pack per day. He has never used smokeless tobacco. He reports that he does not drink alcohol or use drugs.  Allergies  Allergen Reactions  . Fiorinal [Butalbital-Aspirin-Caffeine] Swelling    Family History  Problem Relation Age of Onset  .  Diabetes Mellitus II Neg Hx     Prior to Admission medications   Medication Sig Start Date End Date Taking? Authorizing Provider  baclofen (LIORESAL) 10 MG tablet Take 10 mg by mouth 2 (two) times daily as needed for muscle spasms.   Yes [provider]  carvedilol (COREG) 25 MG tablet Take 25 mg by mouth 2 (two) times daily with a meal.   Yes [provider]  DULoxetine (CYMBALTA) 60 MG capsule Take 60 mg by mouth at bedtime.   Yes [provider]  folic acid (FOLVITE) 1 MG tablet Take 1 mg by mouth daily.   Yes [provider]  insulin glargine (LANTUS) 100 UNIT/ML injection Inject 50 Units into the skin every evening.  08/21/11  Yes Oti, Brantley Stage, MD  insulin regular (NOVOLIN R,HUMULIN R) 100 units/mL injection Inject 15 Units into the skin 2 (two) times daily before a meal.   Yes [provider]  lisinopril (PRINIVIL,ZESTRIL) 40 MG tablet Take 40 mg by mouth daily.   Yes [provider]  pantoprazole (PROTONIX) 40 MG tablet Take 1 tablet (40 mg total) by mouth daily. 05/15/13  Yes Rai, Ripudeep K, MD  alum & mag hydroxide-simeth (MAALOX/MYLANTA) 200-200-20 MG/5ML suspension Take 30 mLs by mouth every 6 (six) hours as needed for indigestion or heartburn (dyspepsia). Patient not taking: Reported on 10/24/2014 10/05/14   Reyne Dumas, MD  magnesium oxide (MAG-OX) 400 MG tablet Take 1 tablet (400 mg total)  by mouth 2 (two) times daily. Patient not taking: Reported on 10/24/2014 10/05/14   Reyne Dumas, MD  sertraline (ZOLOFT) 25 MG tablet Take 1 tablet (25 mg total) by mouth daily. Patient not taking: Reported on 08/27/2016 10/05/14 08/27/16  Reyne Dumas, MD  thiamine 100 MG tablet Take 1 tablet (100 mg total) by mouth daily. Patient not taking: Reported on 08/27/2016 10/05/14   Reyne Dumas, MD    Physical Exam: Vitals:   08/27/16 0300 08/27/16 0345 08/27/16 0430 08/27/16 0515  BP: 135/77 (!) 145/80 (!) 145/75 140/77  Pulse: 71 68 70 72  Resp:  20 17 18 14   Temp:      TempSrc:      SpO2: 100% 100% 99% 100%      Constitutional: Moderately built and nourished. Vitals:   08/27/16 0300 08/27/16 0345 08/27/16 0430 08/27/16 0515  BP: 135/77 (!) 145/80 (!) 145/75 140/77  Pulse: 71 68 70 72  Resp: 20 17 18 14   Temp:      TempSrc:      SpO2: 100% 100% 99% 100%   Eyes: Anicteric no pallor. ENMT: No discharge from the ears eyes nose and mouth. Tongue bite. Neck: No mass felt. No neck rigidity. Respiratory: No rhonchi or crepitations. Cardiovascular: S1 and S2 heard no murmurs appreciated. Abdomen: Soft nontender bowel sounds present. Musculoskeletal: No edema. No joint effusion. Skin: No rash. Skin appears warm. Neurologic: Alert awake oriented to time place and person. Moves all extremities. Psychiatric: Appears normal. Normal affect.   Labs on Admission: I have personally reviewed following labs and imaging studies  CBC:  Recent Labs Lab 08/27/16 0140  WBC 5.3  NEUTROABS 3.6  HGB 11.4*  HCT 33.0*  MCV 90.7  PLT 833   Basic Metabolic Panel:  Recent Labs Lab 08/27/16 0140  NA 135  K 3.4*  CL 103  CO2 22  GLUCOSE 60*  BUN 27*  CREATININE 1.25*  CALCIUM 8.8*   GFR: CrCl cannot be calculated (Unknown ideal weight.). Liver Function Tests:  Recent Labs Lab 08/27/16 0140  AST 70*  ALT 63  ALKPHOS 88  BILITOT 0.9  PROT 6.8  ALBUMIN 3.3*   No results for input(s): LIPASE, AMYLASE in the last 168 hours. No results for input(s): AMMONIA in the last 168 hours. Coagulation Profile: No results for input(s): INR, PROTIME in the last 168 hours. Cardiac Enzymes:  Recent Labs Lab 08/27/16 0140  TROPONINI <0.03   BNP (last 3 results) No results for input(s): PROBNP in the last 8760 hours. HbA1C: No results for input(s): HGBA1C in the last 72 hours. CBG:  Recent Labs Lab 08/27/16 0235 08/27/16 0300 08/27/16 0337 08/27/16 0439 08/27/16 0527  GLUCAP 41* 110* 97 77 74   Lipid Profile: No  results for input(s): CHOL, HDL, LDLCALC, TRIG, CHOLHDL, LDLDIRECT in the last 72 hours. Thyroid Function Tests: No results for input(s): TSH, T4TOTAL, FREET4, T3FREE, THYROIDAB in the last 72 hours. Anemia Panel: No results for input(s): VITAMINB12, FOLATE, FERRITIN, TIBC, IRON, RETICCTPCT in the last 72 hours. Urine analysis:    Component Value Date/Time   COLORURINE STRAW (A) 08/27/2016 0035   APPEARANCEUR CLEAR 08/27/2016 0035   LABSPEC 1.005 08/27/2016 0035   PHURINE 5.0 08/27/2016 0035   GLUCOSEU 150 (A) 08/27/2016 0035   HGBUR SMALL (A) 08/27/2016 0035   BILIRUBINUR NEGATIVE 08/27/2016 0035   KETONESUR NEGATIVE 08/27/2016 0035   PROTEINUR NEGATIVE 08/27/2016 0035   UROBILINOGEN 0.2 10/24/2014 0330   NITRITE NEGATIVE 08/27/2016 0035   LEUKOCYTESUR  NEGATIVE 08/27/2016 0035   Sepsis Labs: @LABRCNTIP (procalcitonin:4,lacticidven:4) )No results found for this or any previous visit (from the past 240 hour(s)).   Radiological Exams on Admission: Dg Chest 2 View  Result Date: 08/27/2016 CLINICAL DATA:  Unresponsive.  History of diabetes and hypertension. EXAM: CHEST  2 VIEW COMPARISON:  Thoracic spine radiographs May 15, 2013 FINDINGS: Cardiomediastinal silhouette is normal. No pleural effusions or focal consolidations. Trachea projects midline and there is no pneumothorax. Soft tissue planes and included osseous structures are non-suspicious. IMPRESSION: No acute cardiopulmonary process. Electronically Signed   By: Elon Alas M.D.   On: 08/27/2016 02:06   Ct Head Wo Contrast  Result Date: 08/27/2016 CLINICAL DATA:  67 year old male with fall.  Altered mental status. EXAM: CT HEAD WITHOUT CONTRAST CT CERVICAL SPINE WITHOUT CONTRAST TECHNIQUE: Multidetector CT imaging of the head and cervical spine was performed following the standard protocol without intravenous contrast. Multiplanar CT image reconstructions of the cervical spine were also generated. COMPARISON:  Head CT  dated 10/03/2014 FINDINGS: CT HEAD FINDINGS Brain: There is moderate age-related atrophy and chronic microvascular ischemic changes. There is no acute intracranial hemorrhage. No mass effect or midline shift noted. No extra-axial fluid collection. Vascular: No hyperdense vessel or unexpected calcification. Skull: Normal. Negative for fracture or focal lesion. Sinuses/Orbits: There is diffuse mucoperiosteal thickening of paranasal sinuses. No air-fluid levels. The mastoid air cells are clear. Other: None CT CERVICAL SPINE FINDINGS Alignment: Normal. Skull base and vertebrae: No acute fracture. No primary bone lesion or focal pathologic process. Soft tissues and spinal canal: No prevertebral fluid or swelling. No visible canal hematoma. Disc levels:  No acute findings.  Mild degenerative changes. Upper chest: Negative. Other: None IMPRESSION: 1. No acute intracranial hemorrhage. Moderate age-related atrophy and chronic microvascular ischemic changes. If symptoms persist, and there are no contraindications, MRI may provide better evaluation if clinically indicated. 2. No acute/traumatic cervical spine pathology. Electronically Signed   By: Anner Crete M.D.   On: 08/27/2016 01:50   Ct Cervical Spine Wo Contrast  Result Date: 08/27/2016 CLINICAL DATA:  66 year old male with fall.  Altered mental status. EXAM: CT HEAD WITHOUT CONTRAST CT CERVICAL SPINE WITHOUT CONTRAST TECHNIQUE: Multidetector CT imaging of the head and cervical spine was performed following the standard protocol without intravenous contrast. Multiplanar CT image reconstructions of the cervical spine were also generated. COMPARISON:  Head CT dated 10/03/2014 FINDINGS: CT HEAD FINDINGS Brain: There is moderate age-related atrophy and chronic microvascular ischemic changes. There is no acute intracranial hemorrhage. No mass effect or midline shift noted. No extra-axial fluid collection. Vascular: No hyperdense vessel or unexpected calcification.  Skull: Normal. Negative for fracture or focal lesion. Sinuses/Orbits: There is diffuse mucoperiosteal thickening of paranasal sinuses. No air-fluid levels. The mastoid air cells are clear. Other: None CT CERVICAL SPINE FINDINGS Alignment: Normal. Skull base and vertebrae: No acute fracture. No primary bone lesion or focal pathologic process. Soft tissues and spinal canal: No prevertebral fluid or swelling. No visible canal hematoma. Disc levels:  No acute findings.  Mild degenerative changes. Upper chest: Negative. Other: None IMPRESSION: 1. No acute intracranial hemorrhage. Moderate age-related atrophy and chronic microvascular ischemic changes. If symptoms persist, and there are no contraindications, MRI may provide better evaluation if clinically indicated. 2. No acute/traumatic cervical spine pathology. Electronically Signed   By: Anner Crete M.D.   On: 08/27/2016 01:50     Assessment/Plan Active Problems:   HTN (hypertension)   Alcohol abuse   Hypoglycemia   Seizure ([place])  Diabetes mellitus type 2 in nonobese (HCC)    1. Seizure - likely precipitated by hypoglycemia. Patient also abuses alcohol. I have discussed with Dr. Wallie Char on-call neurologist who recommended MRI of the brain and EEG since his father for seizures. No antiepileptics at this time. 2. Hypoglycemia and diabetes mellitus type 2 on Lantus - not sure what caused the patient's hypoglycemia. Patient states he has been eating well. And has been on this dose for some time. Will check C-peptide levels and cortisol and TSH since patient is also hypothermic probably from hypoglycemia. Hold Lantus and continue D5 normal saline check hemoglobin A1c and follow CBGs every 2 hours. 3. Hypertension on Coreg and lisinopril. 4. Alcohol abuse - Place patient on CIWA protocol.   DVT prophylaxis: SCDs. Code Status: Full code.  Family Communication: Discussed with patient's wife.  Disposition Plan: Home.  Consults called: None.    Admission status: Observation.    Rise Patience MD [facility] Pager [phone].  If 7PM-7AM, please contact night-coverage [email or web address] Password [email or web address]  08/27/2016, 5:35 AM

## 2016-08-27 NOTE — ED Notes (Signed)
To MRI at this time.

## 2016-08-27 NOTE — ED Notes (Signed)
Attempted report x1. 

## 2016-08-28 DIAGNOSIS — F1010 Alcohol abuse, uncomplicated: Secondary | ICD-10-CM | POA: Diagnosis not present

## 2016-08-28 DIAGNOSIS — E119 Type 2 diabetes mellitus without complications: Secondary | ICD-10-CM | POA: Diagnosis not present

## 2016-08-28 DIAGNOSIS — E162 Hypoglycemia, unspecified: Secondary | ICD-10-CM | POA: Diagnosis not present

## 2016-08-28 DIAGNOSIS — R569 Unspecified convulsions: Secondary | ICD-10-CM | POA: Diagnosis not present

## 2016-08-28 LAB — C-PEPTIDE: C PEPTIDE: 1.2 ng/mL (ref 1.1–4.4)

## 2016-08-28 LAB — GLUCOSE, CAPILLARY
GLUCOSE-CAPILLARY: 51 mg/dL — AB (ref 65–99)
Glucose-Capillary: 94 mg/dL (ref 65–99)

## 2016-08-28 LAB — HEMOGLOBIN A1C
Hgb A1c MFr Bld: 13.5 % — ABNORMAL HIGH (ref 4.8–5.6)
Mean Plasma Glucose: 341 mg/dL

## 2016-08-28 MED ORDER — INSULIN GLARGINE 100 UNIT/ML ~~LOC~~ SOLN: 30.0000 [IU] | Freq: Every day | SUBCUTANEOUS | Status: DC

## 2016-08-28 MED ORDER — INSULIN ASPART 100 UNIT/ML ~~LOC~~ SOLN: SUBCUTANEOUS | 0 refills | Status: DC

## 2016-08-28 MED ORDER — INSULIN GLARGINE 100 UNIT/ML ~~LOC~~ SOLN: 30.0000 [IU] | Freq: Every evening | SUBCUTANEOUS | 11 refills | Status: DC

## 2016-08-28 NOTE — Discharge Summary (Signed)
Tyler Patterson RVI:[id number] DOB: 08-Mar-1951 DOA: 08/27/2016  PCP: [facility]  Admit date: 08/27/2016  Discharge date: 08/28/2016  Admitted From: Home  Disposition:  Home   Recommendations for Outpatient Follow-up:   Follow up with PCP in 1-2 weeks  PCP Please obtain BMP/CBC, 2 view CXR in 1week,  (see Discharge instructions)   PCP Please follow up on the following pending results: Monitor A1c and CBGs   Home Health: None  Equipment/Devices: None  Consultations: Neuro Discharge Condition: Stable   CODE STATUS: Full   Diet Recommendation: Diet heart healthy/carb modified    Chief Complaint  Patient presents with  . Hypoglycemia  . Fall     Brief history of present illness from the day of admission and additional interim summary    Tyler Patterson a 66 y.o.malewith history of diabetes mellitus on Lantus insulin, hypertension, alcohol abuse was brought to the ER after patient had a witnessed seizure by patient's wife. Last evening after cooking patient was sitting on the chair when suddenly patient's wife heard some jerking sounds. On checking patient was having generalized tonic-clonic movement with patient slumped to the right side. EMS was called and when CBG was checked it was 30. Patient was given D50 and was brought to the ER.                                                                 Hospital Course    1.Seizure activity likely induced by hypoglycemia. Family claims that this happened a few years ago as well, EEG, CT scan head and MRI brain unremarkable. CBGs now stable, no signs of DTs, At his baseline and symptom-free, will discharge home with close PCP and one-time outpatient neurology follow-up..  2. Hypoglycemia in a patient with DM type II now insulin-dependent. He is very  non-consistent and noncompliant with his insulin regimen, his A1c actually was 13, however he was persistently hypoglycemic during the first 5-6 hours of his hospitalization and required D5W and glucagon to bring his sugars up, I have adjusted his Lantus dose and placed him on sliding scale, have counseled him on taking his insulin appropriately and doing Accu-Cheks before meals at bedtime and locking in a logbook, requested him to follow with PCP within a week with his CBG log book.  3. GERD. On PPI.  4. Hypertension. Stable on Coreg and lisinopril.  5. Alcohol abuse. Counseled to quit, CIWA protocol. Last drink 4 days ago. No signs of DTs.   Discharge diagnosis     Active Problems:   HTN (hypertension)   Alcohol abuse   Hypoglycemia   Seizure ([place])   Diabetes mellitus type 2 in nonobese [facility])    Discharge instructions    Discharge Instructions    Discharge instructions    Complete  by:  As directed    Do not drive, operate heavy machinery, perform activities at heights, swimming or participation in water activities or provide baby sitting services until you have seen by Primary MD or a Neurologist and advised to do so again.  Follow with [facility] in 7 days   Get CBC, CMP, 2 view Chest X ray checked  by Primary MD or SNF MD in 5-7 days ( we routinely change or add medications that can affect your baseline labs and fluid status, therefore we recommend that you get the mentioned basic workup next visit with your PCP, your PCP may decide not to get them or add new tests based on their clinical decision)  Activity: As tolerated with Full fall precautions use walker/cane & assistance as needed  Disposition Home   Diet:   Diet heart healthy/carb modified   Accuchecks 4 times/day, Once in AM empty stomach and then before each meal. Log in all results and show them to your Prim.MD in 3 days. If any glucose reading is under 80 or above 300 call your Prim  MD immidiately. Follow Low glucose instructions for glucose under 80 as instructed.   For Heart failure patients - Check your Weight same time everyday, if you gain over 2 pounds, or you develop in leg swelling, experience more shortness of breath or chest pain, call your Primary MD immediately. Follow Cardiac Low Salt Diet and 1.5 lit/day fluid restriction.  On your next visit with your primary care physician please Get Medicines reviewed and adjusted.  Please request your Prim.MD to go over all Hospital Tests and Procedure/Radiological results at the follow up, please get all Hospital records sent to your Prim MD by signing hospital release before you go home.  If you experience worsening of your admission symptoms, develop shortness of breath, life threatening emergency, suicidal or homicidal thoughts you must seek medical attention immediately by calling 911 or calling your MD immediately  if symptoms less severe.  You Must read complete instructions/literature along with all the possible adverse reactions/side effects for all the Medicines you take and that have been prescribed to you. Take any new Medicines after you have completely understood and accpet all the possible adverse reactions/side effects.   Do not drive when taking Pain medications.    Do not take more than prescribed Pain, Sleep and Anxiety Medications  Special Instructions: If you have smoked or chewed Tobacco  in the last 2 yrs please stop smoking, stop any regular Alcohol  and or any Recreational drug use.  Wear Seat belts while driving.   Please note  You were cared for by a hospitalist during your hospital stay. If you have any questions about your discharge medications or the care you received while you were in the hospital after you are discharged, you can call the unit and asked to speak with the hospitalist on call if the hospitalist that took care of you is not available. Once you are discharged, your primary  care physician will handle any further medical issues. Please note that NO REFILLS for any discharge medications will be authorized once you are discharged, as it is imperative that you return to your primary care physician (or establish a relationship with a primary care physician if you do not have one) for your aftercare needs so that they can reassess your need for medications and monitor your lab values.   Increase activity slowly    Complete by:  As  directed       Discharge Medications   Allergies as of 08/28/2016      Reactions   Fiorinal [butalbital-aspirin-caffeine] Swelling      Medication List    STOP taking these medications   insulin regular 100 units/mL injection Commonly known as:  NOVOLIN R,HUMULIN R     TAKE these medications   alum & mag hydroxide-simeth 200-200-20 MG/5ML suspension Commonly known as:  MAALOX/MYLANTA Take 30 mLs by mouth every 6 (six) hours as needed for indigestion or heartburn (dyspepsia).   baclofen 10 MG tablet Commonly known as:  LIORESAL Take 10 mg by mouth 2 (two) times daily as needed for muscle spasms.   carvedilol 25 MG tablet Commonly known as:  COREG Take 25 mg by mouth 2 (two) times daily with a meal.   DULoxetine 60 MG capsule Commonly known as:  CYMBALTA Take 60 mg by mouth at bedtime.   folic acid 1 MG tablet Commonly known as:  FOLVITE Take 1 mg by mouth daily.   insulin aspart 100 UNIT/ML injection Commonly known as:  NOVOLOG Before each meal 3 times a day, 140-199 - 2 units, 200-250 - 4 units, 251-299 - 6 units,  300-349 - 8 units,  350 or above 10 units. Dispense syringes and needles as needed, Ok to switch to PEN if approved. Substitute to any brand approved. DX DM2, Code E11.65   insulin glargine 100 UNIT/ML injection Commonly known as:  LANTUS Inject 0.3 mLs (30 Units total) into the skin every evening. What changed:  how much to take   lisinopril 40 MG tablet Commonly known as:  PRINIVIL,ZESTRIL Take 40 mg by  mouth daily.   magnesium oxide 400 MG tablet Commonly known as:  MAG-OX Take 1 tablet (400 mg total) by mouth 2 (two) times daily.   pantoprazole 40 MG tablet Commonly known as:  PROTONIX Take 1 tablet (40 mg total) by mouth daily.   sertraline 25 MG tablet Commonly known as:  ZOLOFT Take 1 tablet (25 mg total) by mouth daily.   thiamine 100 MG tablet Take 1 tablet (100 mg total) by mouth daily.       Follow-up [facility]. Schedule an appointment as soon as possible for a visit in 1 week(s).   Specialty:  General Practice Contact information: [place] [address] [phone]        [facility]. Schedule an appointment as soon as possible for a visit in 2 week(s).   Why:  seizures Contact information: [address]     [place] [address] [phone]          Major procedures and Radiology Reports - PLEASE review detailed and final reports thoroughly  -     EEG - non acute    Dg Chest 2 View  Result Date: 08/27/2016 CLINICAL DATA:  Unresponsive.  History of diabetes and hypertension. EXAM: CHEST  2 VIEW COMPARISON:  Thoracic spine radiographs May 15, 2013 FINDINGS: Cardiomediastinal silhouette is normal. No pleural effusions or focal consolidations. Trachea projects midline and there is no pneumothorax. Soft tissue planes and included osseous structures are non-suspicious. IMPRESSION: No acute cardiopulmonary process. Electronically Signed   By: Elon Alas M.D.   On: 08/27/2016 02:06   Ct Head Wo Contrast  Result Date: 08/27/2016 CLINICAL DATA:  66 year old male with fall.  Altered mental status. EXAM: CT HEAD WITHOUT CONTRAST CT CERVICAL SPINE WITHOUT CONTRAST TECHNIQUE: Multidetector CT  imaging of the head and cervical spine was performed following the standard protocol without intravenous contrast. Multiplanar CT image reconstructions of the cervical spine were  also generated. COMPARISON:  Head CT dated 10/03/2014 FINDINGS: CT HEAD FINDINGS Brain: There is moderate age-related atrophy and chronic microvascular ischemic changes. There is no acute intracranial hemorrhage. No mass effect or midline shift noted. No extra-axial fluid collection. Vascular: No hyperdense vessel or unexpected calcification. Skull: Normal. Negative for fracture or focal lesion. Sinuses/Orbits: There is diffuse mucoperiosteal thickening of paranasal sinuses. No air-fluid levels. The mastoid air cells are clear. Other: None CT CERVICAL SPINE FINDINGS Alignment: Normal. Skull base and vertebrae: No acute fracture. No primary bone lesion or focal pathologic process. Soft tissues and spinal canal: No prevertebral fluid or swelling. No visible canal hematoma. Disc levels:  No acute findings.  Mild degenerative changes. Upper chest: Negative. Other: None IMPRESSION: 1. No acute intracranial hemorrhage. Moderate age-related atrophy and chronic microvascular ischemic changes. If symptoms persist, and there are no contraindications, MRI may provide better evaluation if clinically indicated. 2. No acute/traumatic cervical spine pathology. Electronically Signed   By: Anner Crete M.D.   On: 08/27/2016 01:50   Ct Cervical Spine Wo Contrast  Result Date: 08/27/2016 CLINICAL DATA:  66 year old male with fall.  Altered mental status. EXAM: CT HEAD WITHOUT CONTRAST CT CERVICAL SPINE WITHOUT CONTRAST TECHNIQUE: Multidetector CT imaging of the head and cervical spine was performed following the standard protocol without intravenous contrast. Multiplanar CT image reconstructions of the cervical spine were also generated. COMPARISON:  Head CT dated 10/03/2014 FINDINGS: CT HEAD FINDINGS Brain: There is moderate age-related atrophy and chronic microvascular ischemic changes. There is no acute intracranial hemorrhage. No mass effect or midline shift noted. No extra-axial fluid collection. Vascular: No hyperdense  vessel or unexpected calcification. Skull: Normal. Negative for fracture or focal lesion. Sinuses/Orbits: There is diffuse mucoperiosteal thickening of paranasal sinuses. No air-fluid levels. The mastoid air cells are clear. Other: None CT CERVICAL SPINE FINDINGS Alignment: Normal. Skull base and vertebrae: No acute fracture. No primary bone lesion or focal pathologic process. Soft tissues and spinal canal: No prevertebral fluid or swelling. No visible canal hematoma. Disc levels:  No acute findings.  Mild degenerative changes. Upper chest: Negative. Other: None IMPRESSION: 1. No acute intracranial hemorrhage. Moderate age-related atrophy and chronic microvascular ischemic changes. If symptoms persist, and there are no contraindications, MRI may provide better evaluation if clinically indicated. 2. No acute/traumatic cervical spine pathology. Electronically Signed   By: Anner Crete M.D.   On: 08/27/2016 01:50   Mr Brain Wo Contrast  Result Date: 08/27/2016 CLINICAL DATA:  Seizure like symptoms, unresponsive. History of diabetes, hypertension and seizures. EXAM: MRI HEAD WITHOUT CONTRAST TECHNIQUE: Multiplanar, multiecho pulse sequences of the brain and surrounding structures were obtained without intravenous contrast. COMPARISON:  CT HEAD Aug 27, 2016 at 0115 hours and MRI of the head May 04, 2013 FINDINGS: BRAIN: No reduced diffusion to suggest acute ischemia. No susceptibility artifact to suggest hemorrhage. Moderate ventriculomegaly on the basis of global parenchymal brain volume loss. Confluent supratentorial patchy pontine white matter FLAIR T2 hyperintensities. Old small LEFT cerebellar infarct. Old RIGHT thalamus and bilateral basal ganglia lacunar infarcts though new from 2015. No midline shift, mass effect or masses. No abnormal extra-axial fluid collections. VASCULAR: Normal major intracranial vascular flow voids present at skull base. SKULL AND UPPER CERVICAL SPINE: No abnormal sellar  expansion. No suspicious calvarial bone marrow signal. Craniocervical junction maintained. SINUSES/ORBITS: Mild to moderate paranasal sinus  mucosal thickening. Mastoid air cells are well aerated. The included ocular globes and orbital contents are non-suspicious. Status post bilateral ocular lens implants. OTHER: None. IMPRESSION: No acute intracranial process. Moderate atrophy, advanced for age and progressed from prior MRI. Worsening, severe chronic small vessel ischemic disease. 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 Electronically Signed   By: Elon Alas M.D.   On: 08/27/2016 06:50    Micro Results     Recent Results (from the past 240 hour(s))  MRSA PCR Screening     Status: None   Collection Time: 08/27/16 12:58 PM  Result Value Ref Range Status   MRSA by PCR NEGATIVE NEGATIVE Final    Comment:        The GeneXpert MRSA Assay (FDA approved for NASAL specimens only), is one component of a comprehensive MRSA colonization surveillance program. It is not intended to diagnose MRSA infection nor to guide or monitor treatment for MRSA infections.     Today   Subjective    Tiandre Teall today has no headache,no chest abdominal pain,no new weakness tingling or numbness, feels much better wants to go home today.     Objective   Blood pressure 123/69, pulse 61, temperature 97.8 F (36.6 C), temperature source Oral, resp. rate 15, height 5\' 8"  (1.727 m), weight 89.4 kg (197 lb), SpO2 100 %.   Intake/Output Summary (Last 24 hours) at 08/28/16 0853 Last data filed at 08/28/16 0823  Gross per 24 hour  Intake              580 ml  Output              825 ml  Net             -245 ml    Exam Awake Alert, Oriented x 3, No new F.N deficits, Normal affect [place].AT,PERRAL Supple Neck,No JVD, No cervical lymphadenopathy appriciated.  Symmetrical Chest wall movement, Good air movement bilaterally, CTAB RRR,No Gallops,Rubs or new Murmurs,  No Parasternal Heave +ve B.Sounds, Abd Soft, Non tender, No organomegaly appriciated, No rebound -guarding or rigidity. No Cyanosis, Clubbing or edema, No new Rash or bruise   Data Review   CBC w Diff: Lab Results  Component Value Date   WBC 5.0 08/27/2016   HGB 10.9 (L) 08/27/2016   HCT 32.0 (L) 08/27/2016   PLT 195 08/27/2016   LYMPHOPCT 25 08/27/2016   MONOPCT 9 08/27/2016   EOSPCT 0 08/27/2016   BASOPCT 0 08/27/2016    CMP: Lab Results  Component Value Date   NA 135 08/27/2016   K 3.6 08/27/2016   CL 99 (L) 08/27/2016   CO2 27 08/27/2016   BUN 26 (H) 08/27/2016   CREATININE 1.29 (H) 08/27/2016   PROT 6.6 08/27/2016   ALBUMIN 3.2 (L) 08/27/2016   BILITOT 1.1 08/27/2016   ALKPHOS 91 08/27/2016   AST 56 (H) 08/27/2016   ALT 57 08/27/2016  . Lab Results  Component Value Date   HGBA1C 13.5 (H) 08/27/2016   CBG (last 3)   Recent Labs  08/27/16 1211 08/27/16 1603 08/27/16 2125  GLUCAP 264* 268* 237*      Total Time in preparing paper work, data evaluation and todays exam - 30 minutes  Lala Lund M.D on 08/28/2016 at 8:53 AM  [facility]   Office  [phone]

## 2016-08-28 NOTE — Care Management Note (Signed)
Case Management Note  Patient Details  Name: Tyler Patterson MRN: [id number] Date of Birth: 1950-04-26  Subjective/Objective:  From home, presents with hypoglycemia, seizure, hx of etoh abuse, htn, gerd.    [facility] PCP                Action/Plan: NCM will follow for dc needs.  Expected Discharge Date:  08/28/16               Expected Discharge Plan:  Home/Self Care  In-House Referral:     Discharge planning Services  CM Consult  Post Acute Care Choice:    Choice offered to:     DME Arranged:    DME Agency:     HH Arranged:    HH Agency:     Status of Service:  Completed, signed off  If discussed at [employer] of Stay Meetings, dates discussed:    Additional Comments:  Zenon Mayo, RN 08/28/2016, 4:42 PM

## 2016-08-28 NOTE — Plan of Care (Signed)
Problem: Activity: Goal: Risk for activity intolerance will decrease Outcome: Progressing Pt. willing and eager to comply with orders for increased activity.

## 2016-08-28 NOTE — Progress Notes (Signed)
Patient given discharge instructions. Patient educated on medications. Patient instructed to make follow up appts. Patient verbalized understanding. Patient being discharged home with family.

## 2016-08-28 NOTE — Discharge Instructions (Signed)
Do not drive, operate heavy machinery, perform activities at heights, swimming or participation in water activities or provide baby sitting services until you have seen by Primary MD or a Neurologist and advised to do so again.  Follow with [facility] in 7 days   Get CBC, CMP, 2 view Chest X ray checked  by Primary MD or SNF MD in 5-7 days ( we routinely change or add medications that can affect your baseline labs and fluid status, therefore we recommend that you get the mentioned basic workup next visit with your PCP, your PCP may decide not to get them or add new tests based on their clinical decision)  Activity: As tolerated with Full fall precautions use walker/cane & assistance as needed  Disposition Home   Diet:   Diet heart healthy/carb modified   Accuchecks 4 times/day, Once in AM empty stomach and then before each meal. Log in all results and show them to your Prim.MD in 3 days. If any glucose reading is under 80 or above 300 call your Prim MD immidiately. Follow Low glucose instructions for glucose under 80 as instructed.   For Heart failure patients - Check your Weight same time everyday, if you gain over 2 pounds, or you develop in leg swelling, experience more shortness of breath or chest pain, call your Primary MD immediately. Follow Cardiac Low Salt Diet and 1.5 lit/day fluid restriction.  On your next visit with your primary care physician please Get Medicines reviewed and adjusted.  Please request your Prim.MD to go over all Hospital Tests and Procedure/Radiological results at the follow up, please get all Hospital records sent to your Prim MD by signing hospital release before you go home.  If you experience worsening of your admission symptoms, develop shortness of breath, life threatening emergency, suicidal or homicidal thoughts you must seek medical attention immediately by calling 911 or calling your MD immediately  if symptoms less  severe.  You Must read complete instructions/literature along with all the possible adverse reactions/side effects for all the Medicines you take and that have been prescribed to you. Take any new Medicines after you have completely understood and accpet all the possible adverse reactions/side effects.   Do not drive when taking Pain medications.    Do not take more than prescribed Pain, Sleep and Anxiety Medications  Special Instructions: If you have smoked or chewed Tobacco  in the last 2 yrs please stop smoking, stop any regular Alcohol  and or any Recreational drug use.  Wear Seat belts while driving.   Please note  You were cared for by a hospitalist during your hospital stay. If you have any questions about your discharge medications or the care you received while you were in the hospital after you are discharged, you can call the unit and asked to speak with the hospitalist on call if the hospitalist that took care of you is not available. Once you are discharged, your primary care physician will handle any further medical issues. Please note that NO REFILLS for any discharge medications will be authorized once you are discharged, as it is imperative that you return to your primary care physician (or establish a relationship with a primary care physician if you do not have one) for your aftercare needs so that they can reassess your need for medications and monitor your lab values.

## 2016-11-27 DIAGNOSIS — Z125 Encounter for screening for malignant neoplasm of prostate: Secondary | ICD-10-CM | POA: Diagnosis not present

## 2016-11-27 DIAGNOSIS — R109 Unspecified abdominal pain: Secondary | ICD-10-CM | POA: Diagnosis not present

## 2016-11-27 DIAGNOSIS — I10 Essential (primary) hypertension: Secondary | ICD-10-CM | POA: Diagnosis not present

## 2016-11-27 DIAGNOSIS — E1140 Type 2 diabetes mellitus with diabetic neuropathy, unspecified: Secondary | ICD-10-CM | POA: Diagnosis not present

## 2016-12-22 ENCOUNTER — Encounter (HOSPITAL_COMMUNITY): Payer: Self-pay

## 2016-12-22 ENCOUNTER — Emergency Department (HOSPITAL_COMMUNITY): Payer: PPO

## 2016-12-22 ENCOUNTER — Inpatient Hospital Stay (HOSPITAL_COMMUNITY)
Admission: EM | Admit: 2016-12-22 | Discharge: 2016-12-24 | DRG: 914 | Disposition: A | Discharge status: Home / Self Care | Payer: PPO | Attending: General Surgery | Admitting: General Surgery

## 2016-12-22 DIAGNOSIS — K661 Hemoperitoneum: Secondary | ICD-10-CM

## 2016-12-22 DIAGNOSIS — S36892A Contusion of other intra-abdominal organs, initial encounter: Principal | ICD-10-CM | POA: Diagnosis present

## 2016-12-22 DIAGNOSIS — J439 Emphysema, unspecified: Secondary | ICD-10-CM | POA: Diagnosis not present

## 2016-12-22 DIAGNOSIS — N3289 Other specified disorders of bladder: Secondary | ICD-10-CM | POA: Diagnosis present

## 2016-12-22 DIAGNOSIS — N179 Acute kidney failure, unspecified: Secondary | ICD-10-CM | POA: Diagnosis present

## 2016-12-22 DIAGNOSIS — D62 Acute posthemorrhagic anemia: Secondary | ICD-10-CM | POA: Diagnosis not present

## 2016-12-22 DIAGNOSIS — Z9049 Acquired absence of other specified parts of digestive tract: Secondary | ICD-10-CM

## 2016-12-22 DIAGNOSIS — Y92410 Unspecified street and highway as the place of occurrence of the external cause: Secondary | ICD-10-CM

## 2016-12-22 DIAGNOSIS — S80211A Abrasion, right knee, initial encounter: Secondary | ICD-10-CM | POA: Diagnosis not present

## 2016-12-22 DIAGNOSIS — E162 Hypoglycemia, unspecified: Secondary | ICD-10-CM | POA: Diagnosis not present

## 2016-12-22 DIAGNOSIS — R079 Chest pain, unspecified: Secondary | ICD-10-CM | POA: Diagnosis not present

## 2016-12-22 DIAGNOSIS — S0031XA Abrasion of nose, initial encounter: Secondary | ICD-10-CM | POA: Diagnosis present

## 2016-12-22 DIAGNOSIS — E161 Other hypoglycemia: Secondary | ICD-10-CM | POA: Diagnosis not present

## 2016-12-22 DIAGNOSIS — E11649 Type 2 diabetes mellitus with hypoglycemia without coma: Secondary | ICD-10-CM | POA: Diagnosis not present

## 2016-12-22 DIAGNOSIS — K219 Gastro-esophageal reflux disease without esophagitis: Secondary | ICD-10-CM | POA: Diagnosis not present

## 2016-12-22 DIAGNOSIS — R319 Hematuria, unspecified: Secondary | ICD-10-CM | POA: Diagnosis present

## 2016-12-22 DIAGNOSIS — R109 Unspecified abdominal pain: Secondary | ICD-10-CM | POA: Diagnosis not present

## 2016-12-22 DIAGNOSIS — Z794 Long term (current) use of insulin: Secondary | ICD-10-CM | POA: Diagnosis not present

## 2016-12-22 DIAGNOSIS — S3991XA Unspecified injury of abdomen, initial encounter: Secondary | ICD-10-CM | POA: Diagnosis not present

## 2016-12-22 DIAGNOSIS — Z23 Encounter for immunization: Secondary | ICD-10-CM

## 2016-12-22 DIAGNOSIS — Z79899 Other long term (current) drug therapy: Secondary | ICD-10-CM

## 2016-12-22 DIAGNOSIS — S36898A Other injury of other intra-abdominal organs, initial encounter: Secondary | ICD-10-CM | POA: Diagnosis not present

## 2016-12-22 DIAGNOSIS — F17290 Nicotine dependence, other tobacco product, uncomplicated: Secondary | ICD-10-CM | POA: Diagnosis not present

## 2016-12-22 DIAGNOSIS — S3993XA Unspecified injury of pelvis, initial encounter: Secondary | ICD-10-CM | POA: Diagnosis not present

## 2016-12-22 DIAGNOSIS — F17210 Nicotine dependence, cigarettes, uncomplicated: Secondary | ICD-10-CM | POA: Diagnosis not present

## 2016-12-22 DIAGNOSIS — R4020 Unspecified coma: Secondary | ICD-10-CM | POA: Diagnosis not present

## 2016-12-22 DIAGNOSIS — I10 Essential (primary) hypertension: Secondary | ICD-10-CM | POA: Diagnosis not present

## 2016-12-22 DIAGNOSIS — Z888 Allergy status to other drugs, medicaments and biological substances status: Secondary | ICD-10-CM

## 2016-12-22 DIAGNOSIS — S0990XA Unspecified injury of head, initial encounter: Secondary | ICD-10-CM | POA: Diagnosis not present

## 2016-12-22 DIAGNOSIS — R413 Other amnesia: Secondary | ICD-10-CM | POA: Diagnosis not present

## 2016-12-22 DIAGNOSIS — M542 Cervicalgia: Secondary | ICD-10-CM | POA: Diagnosis not present

## 2016-12-22 DIAGNOSIS — S199XXA Unspecified injury of neck, initial encounter: Secondary | ICD-10-CM | POA: Diagnosis not present

## 2016-12-22 DIAGNOSIS — R51 Headache: Secondary | ICD-10-CM | POA: Diagnosis not present

## 2016-12-22 DIAGNOSIS — R102 Pelvic and perineal pain: Secondary | ICD-10-CM | POA: Diagnosis not present

## 2016-12-22 LAB — COMPREHENSIVE METABOLIC PANEL
ALK PHOS: 75 U/L (ref 38–126)
ALT: 21 U/L (ref 17–63)
ANION GAP: 5 (ref 5–15)
AST: 22 U/L (ref 15–41)
Albumin: 3 g/dL — ABNORMAL LOW (ref 3.5–5.0)
BILIRUBIN TOTAL: 0.7 mg/dL (ref 0.3–1.2)
BUN: 17 mg/dL (ref 6–20)
CALCIUM: 8.4 mg/dL — AB (ref 8.9–10.3)
CO2: 27 mmol/L (ref 22–32)
Chloride: 105 mmol/L (ref 101–111)
Creatinine, Ser: 1.58 mg/dL — ABNORMAL HIGH (ref 0.61–1.24)
GFR calc non Af Amer: 44 mL/min — ABNORMAL LOW (ref >60)
GFR, EST AFRICAN AMERICAN: 51 mL/min — AB (ref >60)
Glucose, Bld: 95 mg/dL (ref 65–99)
Potassium: 3.6 mmol/L (ref 3.5–5.1)
Sodium: 137 mmol/L (ref 135–145)
TOTAL PROTEIN: 6 g/dL — AB (ref 6.5–8.1)

## 2016-12-22 LAB — URINALYSIS, ROUTINE W REFLEX MICROSCOPIC
Bilirubin Urine: NEGATIVE
GLUCOSE, UA: 50 mg/dL — AB
Ketones, ur: NEGATIVE mg/dL
Leukocytes, UA: NEGATIVE
Nitrite: NEGATIVE
PH: 5 (ref 5.0–8.0)
Protein, ur: 30 mg/dL — AB
SPECIFIC GRAVITY, URINE: 1.004 — AB (ref 1.005–1.030)
Squamous Epithelial / LPF: NONE SEEN

## 2016-12-22 LAB — CBG MONITORING, ED
GLUCOSE-CAPILLARY: 75 mg/dL (ref 65–99)
GLUCOSE-CAPILLARY: 76 mg/dL (ref 65–99)
GLUCOSE-CAPILLARY: 97 mg/dL (ref 65–99)
GLUCOSE-CAPILLARY: 197 mg/dL — AB (ref 65–99)
Glucose-Capillary: 103 mg/dL — ABNORMAL HIGH (ref 65–99)
Glucose-Capillary: 40 mg/dL — CL (ref 65–99)
Glucose-Capillary: 66 mg/dL (ref 65–99)
Glucose-Capillary: 108 mg/dL — ABNORMAL HIGH (ref 65–99)
Glucose-Capillary: 126 mg/dL — ABNORMAL HIGH (ref 65–99)

## 2016-12-22 LAB — CBC
HCT: 30.7 % — ABNORMAL LOW (ref 39.0–52.0)
HEMOGLOBIN: 10.2 g/dL — AB (ref 13.0–17.0)
MCH: 30 pg (ref 26.0–34.0)
MCHC: 33.2 g/dL (ref 30.0–36.0)
MCV: 90.3 fL (ref 78.0–100.0)
Platelets: 181 10*3/uL (ref 150–400)
RBC: 3.4 MIL/uL — ABNORMAL LOW (ref 4.22–5.81)
RDW: 13.5 % (ref 11.5–15.5)
WBC: 7.8 10*3/uL (ref 4.0–10.5)

## 2016-12-22 LAB — ETHANOL

## 2016-12-22 LAB — I-STAT CHEM 8, ED
BUN: 18 mg/dL (ref 6–20)
CHLORIDE: 103 mmol/L (ref 101–111)
CREATININE: 1.5 mg/dL — AB (ref 0.61–1.24)
Calcium, Ion: 1.13 mmol/L — ABNORMAL LOW (ref 1.15–1.40)
GLUCOSE: 100 mg/dL — AB (ref 65–99)
HCT: 32 % — ABNORMAL LOW (ref 39.0–52.0)
Hemoglobin: 10.9 g/dL — ABNORMAL LOW (ref 13.0–17.0)
Potassium: 3.6 mmol/L (ref 3.5–5.1)
Sodium: 140 mmol/L (ref 135–145)
TCO2: 26 mmol/L (ref 22–32)

## 2016-12-22 LAB — SAMPLE TO BLOOD BANK

## 2016-12-22 LAB — GLUCOSE, CAPILLARY: GLUCOSE-CAPILLARY: 234 mg/dL — AB (ref 65–99)

## 2016-12-22 LAB — PROTIME-INR
INR: 1.04
Prothrombin Time: 13.5 seconds (ref 11.4–15.2)

## 2016-12-22 LAB — I-STAT CG4 LACTIC ACID, ED: Lactic Acid, Venous: 1.77 mmol/L (ref 0.5–1.9)

## 2016-12-22 LAB — CDS SEROLOGY

## 2016-12-22 MED ORDER — HYDRALAZINE HCL 20 MG/ML IJ SOLN: 10.0000 mg | INTRAMUSCULAR | Status: DC | PRN

## 2016-12-22 MED ORDER — SODIUM CHLORIDE 0.9 % IV BOLUS (SEPSIS)
1000.0000 mL | Freq: Once | INTRAVENOUS | Status: AC
  Administered 2016-12-22: 1000 mL via INTRAVENOUS

## 2016-12-22 MED ORDER — PANTOPRAZOLE SODIUM 40 MG PO TBEC
40.0000 mg | DELAYED_RELEASE_TABLET | Freq: Every day | ORAL | Status: DC
  Administered 2016-12-22 – 2016-12-24 (×3): 40 mg via ORAL
  Filled 2016-12-22 (×3): qty 1

## 2016-12-22 MED ORDER — INSULIN ASPART 100 UNIT/ML ~~LOC~~ SOLN
0.0000 [IU] | Freq: Every day | SUBCUTANEOUS | Status: DC
  Administered 2016-12-22: 3 [IU] via SUBCUTANEOUS

## 2016-12-22 MED ORDER — DEXTROSE 50 % IV SOLN
INTRAVENOUS | Status: AC
  Administered 2016-12-22: 50 mL
  Filled 2016-12-22: qty 50

## 2016-12-22 MED ORDER — DOCUSATE SODIUM 100 MG PO CAPS
100.0000 mg | ORAL_CAPSULE | Freq: Two times a day (BID) | ORAL | Status: DC
  Administered 2016-12-22 – 2016-12-23 (×4): 100 mg via ORAL
  Filled 2016-12-22 (×5): qty 1

## 2016-12-22 MED ORDER — DEXTROSE-NACL 5-0.9 % IV SOLN
INTRAVENOUS | Status: DC
  Administered 2016-12-22: 21:00:00 via INTRAVENOUS

## 2016-12-22 MED ORDER — INFLUENZA VAC SPLIT HIGH-DOSE 0.5 ML IM SUSY
0.5000 mL | PREFILLED_SYRINGE | INTRAMUSCULAR | Status: AC
  Administered 2016-12-24: 0.5 mL via INTRAMUSCULAR
  Filled 2016-12-22: qty 0.5

## 2016-12-22 MED ORDER — BACITRACIN ZINC 500 UNIT/GM EX OINT
TOPICAL_OINTMENT | Freq: Two times a day (BID) | CUTANEOUS | Status: DC
  Administered 2016-12-22 (×2): 1 via TOPICAL
  Administered 2016-12-23 – 2016-12-24 (×3): via TOPICAL
  Filled 2016-12-22: qty 28.35

## 2016-12-22 MED ORDER — CARVEDILOL 25 MG PO TABS
25.0000 mg | ORAL_TABLET | Freq: Two times a day (BID) | ORAL | Status: DC
  Administered 2016-12-23 – 2016-12-24 (×3): 25 mg via ORAL
  Filled 2016-12-22 (×3): qty 1

## 2016-12-22 MED ORDER — OXYCODONE HCL 5 MG PO TABS
5.0000 mg | ORAL_TABLET | ORAL | Status: DC | PRN
  Administered 2016-12-22: 10 mg via ORAL
  Filled 2016-12-22: qty 2

## 2016-12-22 MED ORDER — ONDANSETRON HCL 4 MG/2ML IJ SOLN: 4.0000 mg | Freq: Four times a day (QID) | INTRAMUSCULAR | Status: DC | PRN

## 2016-12-22 MED ORDER — PANTOPRAZOLE SODIUM 40 MG IV SOLR: 40.0000 mg | Freq: Every day | INTRAVENOUS | Status: DC

## 2016-12-22 MED ORDER — FOLIC ACID 1 MG PO TABS
1.0000 mg | ORAL_TABLET | Freq: Every day | ORAL | Status: DC
  Administered 2016-12-22 – 2016-12-24 (×3): 1 mg via ORAL
  Filled 2016-12-22 (×3): qty 1

## 2016-12-22 MED ORDER — ONDANSETRON 4 MG PO TBDP
4.0000 mg | ORAL_TABLET | Freq: Four times a day (QID) | ORAL | Status: DC | PRN
Start: 2016-12-22 — End: 2016-12-24

## 2016-12-22 MED ORDER — ACETAMINOPHEN 325 MG PO TABS
650.0000 mg | ORAL_TABLET | Freq: Four times a day (QID) | ORAL | Status: DC
  Administered 2016-12-22 – 2016-12-24 (×8): 650 mg via ORAL
  Filled 2016-12-22 (×8): qty 2

## 2016-12-22 MED ORDER — PREGABALIN 75 MG PO CAPS
150.0000 mg | ORAL_CAPSULE | Freq: Two times a day (BID) | ORAL | Status: DC
  Administered 2016-12-23 – 2016-12-24 (×3): 150 mg via ORAL
  Filled 2016-12-22 (×4): qty 2

## 2016-12-22 MED ORDER — INSULIN ASPART 100 UNIT/ML ~~LOC~~ SOLN
0.0000 [IU] | Freq: Three times a day (TID) | SUBCUTANEOUS | Status: DC
  Administered 2016-12-22: 2 [IU] via SUBCUTANEOUS
  Administered 2016-12-23: 11 [IU] via SUBCUTANEOUS
  Filled 2016-12-22: qty 1

## 2016-12-22 MED ORDER — HYDROMORPHONE HCL 1 MG/ML IJ SOLN
0.5000 mg | INTRAMUSCULAR | Status: DC | PRN
  Administered 2016-12-22: 0.5 mg via INTRAVENOUS
  Filled 2016-12-22: qty 1

## 2016-12-22 MED ORDER — DEXTROSE 10 % IV SOLN
INTRAVENOUS | Status: DC
  Administered 2016-12-22: 12:00:00 via INTRAVENOUS

## 2016-12-22 MED ORDER — IOPAMIDOL (ISOVUE-300) INJECTION 61%
INTRAVENOUS | Status: AC
  Administered 2016-12-22: 100 mL
  Filled 2016-12-22: qty 100

## 2016-12-22 MED ORDER — SPIRONOLACTONE 50 MG PO TABS
50.0000 mg | ORAL_TABLET | Freq: Every day | ORAL | Status: DC
  Administered 2016-12-23 – 2016-12-24 (×2): 50 mg via ORAL
  Filled 2016-12-22 (×2): qty 1

## 2016-12-22 MED ORDER — MIRTAZAPINE 15 MG PO TBDP
15.0000 mg | ORAL_TABLET | Freq: Every day | ORAL | Status: DC
  Administered 2016-12-22 – 2016-12-23 (×2): 15 mg via ORAL
  Filled 2016-12-22 (×3): qty 1

## 2016-12-22 NOTE — ED Notes (Signed)
CBG 66, Dr Tyrone Nine notified and ordered another amp of D50 and to start at D10 infusion at 125/hr

## 2016-12-22 NOTE — ED Notes (Signed)
CBG 108. 
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
## 2016-12-22 NOTE — ED Notes (Signed)
Gave pt ice water, per Dr. Tyrone Nine.

## 2016-12-22 NOTE — ED Notes (Signed)
Pt's CBG result was 75. Informed Lennette Bihari - RN.

## 2016-12-22 NOTE — ED Notes (Signed)
Wife at bedside now. Has no pertinent information to add at this time. Informed of pt status. Pt is resting in bed, alert and oriented x 4, appears sleepy. VSS.

## 2016-12-22 NOTE — ED Notes (Signed)
Pt's CBG result was 97. Informed Lennette Bihari - RN.

## 2016-12-22 NOTE — ED Notes (Signed)
Pt taken to CT.

## 2016-12-22 NOTE — ED Notes (Signed)
CBG 103, Portable in room

## 2016-12-22 NOTE — ED Notes (Signed)
  CBG 103  
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
## 2016-12-22 NOTE — ED Notes (Signed)
Dr Tyrone Nine in room. CBG 40. Pt using urinal and urinating blood.
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
## 2016-12-23 DIAGNOSIS — S0031XA Abrasion of nose, initial encounter: Secondary | ICD-10-CM | POA: Diagnosis present

## 2016-12-23 DIAGNOSIS — J439 Emphysema, unspecified: Secondary | ICD-10-CM | POA: Diagnosis present

## 2016-12-23 DIAGNOSIS — R413 Other amnesia: Secondary | ICD-10-CM | POA: Diagnosis present

## 2016-12-23 DIAGNOSIS — S36892A Contusion of other intra-abdominal organs, initial encounter: Secondary | ICD-10-CM | POA: Diagnosis present

## 2016-12-23 DIAGNOSIS — K219 Gastro-esophageal reflux disease without esophagitis: Secondary | ICD-10-CM | POA: Diagnosis present

## 2016-12-23 DIAGNOSIS — D62 Acute posthemorrhagic anemia: Secondary | ICD-10-CM | POA: Diagnosis present

## 2016-12-23 DIAGNOSIS — Y92410 Unspecified street and highway as the place of occurrence of the external cause: Secondary | ICD-10-CM | POA: Diagnosis not present

## 2016-12-23 DIAGNOSIS — Z23 Encounter for immunization: Secondary | ICD-10-CM | POA: Diagnosis present

## 2016-12-23 DIAGNOSIS — N3289 Other specified disorders of bladder: Secondary | ICD-10-CM | POA: Diagnosis present

## 2016-12-23 DIAGNOSIS — R319 Hematuria, unspecified: Secondary | ICD-10-CM | POA: Diagnosis not present

## 2016-12-23 DIAGNOSIS — Z794 Long term (current) use of insulin: Secondary | ICD-10-CM | POA: Diagnosis not present

## 2016-12-23 DIAGNOSIS — F17290 Nicotine dependence, other tobacco product, uncomplicated: Secondary | ICD-10-CM | POA: Diagnosis present

## 2016-12-23 DIAGNOSIS — S80211A Abrasion, right knee, initial encounter: Secondary | ICD-10-CM | POA: Diagnosis present

## 2016-12-23 DIAGNOSIS — I10 Essential (primary) hypertension: Secondary | ICD-10-CM | POA: Diagnosis present

## 2016-12-23 DIAGNOSIS — F17210 Nicotine dependence, cigarettes, uncomplicated: Secondary | ICD-10-CM | POA: Diagnosis present

## 2016-12-23 DIAGNOSIS — E11649 Type 2 diabetes mellitus with hypoglycemia without coma: Secondary | ICD-10-CM | POA: Diagnosis present

## 2016-12-23 DIAGNOSIS — N179 Acute kidney failure, unspecified: Secondary | ICD-10-CM | POA: Diagnosis present

## 2016-12-23 DIAGNOSIS — E162 Hypoglycemia, unspecified: Secondary | ICD-10-CM | POA: Diagnosis present

## 2016-12-23 DIAGNOSIS — S36898A Other injury of other intra-abdominal organs, initial encounter: Secondary | ICD-10-CM | POA: Diagnosis not present

## 2016-12-23 DIAGNOSIS — Z888 Allergy status to other drugs, medicaments and biological substances status: Secondary | ICD-10-CM | POA: Diagnosis not present

## 2016-12-23 DIAGNOSIS — Z79899 Other long term (current) drug therapy: Secondary | ICD-10-CM | POA: Diagnosis not present

## 2016-12-23 DIAGNOSIS — Z9049 Acquired absence of other specified parts of digestive tract: Secondary | ICD-10-CM | POA: Diagnosis not present

## 2016-12-23 LAB — COMPREHENSIVE METABOLIC PANEL
ALK PHOS: 69 U/L (ref 38–126)
ALT: 21 U/L (ref 17–63)
ANION GAP: 5 (ref 5–15)
AST: 39 U/L (ref 15–41)
Albumin: 2.6 g/dL — ABNORMAL LOW (ref 3.5–5.0)
BILIRUBIN TOTAL: 0.7 mg/dL (ref 0.3–1.2)
BUN: 14 mg/dL (ref 6–20)
CALCIUM: 7.8 mg/dL — AB (ref 8.9–10.3)
CO2: 28 mmol/L (ref 22–32)
Chloride: 100 mmol/L — ABNORMAL LOW (ref 101–111)
Creatinine, Ser: 1.51 mg/dL — ABNORMAL HIGH (ref 0.61–1.24)
GFR, EST AFRICAN AMERICAN: 54 mL/min — AB (ref >60)
GFR, EST NON AFRICAN AMERICAN: 46 mL/min — AB (ref >60)
GLUCOSE: 262 mg/dL — AB (ref 65–99)
POTASSIUM: 3.8 mmol/L (ref 3.5–5.1)
Sodium: 133 mmol/L — ABNORMAL LOW (ref 135–145)
TOTAL PROTEIN: 5.5 g/dL — AB (ref 6.5–8.1)

## 2016-12-23 LAB — GLUCOSE, CAPILLARY
GLUCOSE-CAPILLARY: 58 mg/dL — AB (ref 65–99)
GLUCOSE-CAPILLARY: 74 mg/dL (ref 65–99)
Glucose-Capillary: 306 mg/dL — ABNORMAL HIGH (ref 65–99)
Glucose-Capillary: 65 mg/dL (ref 65–99)
Glucose-Capillary: 195 mg/dL — ABNORMAL HIGH (ref 65–99)
Glucose-Capillary: 223 mg/dL — ABNORMAL HIGH (ref 65–99)

## 2016-12-23 LAB — CBC
HCT: 28.7 % — ABNORMAL LOW (ref 39.0–52.0)
HEMOGLOBIN: 9.7 g/dL — AB (ref 13.0–17.0)
MCH: 30.5 pg (ref 26.0–34.0)
MCHC: 33.8 g/dL (ref 30.0–36.0)
MCV: 90.3 fL (ref 78.0–100.0)
Platelets: 158 10*3/uL (ref 150–400)
RBC: 3.18 MIL/uL — ABNORMAL LOW (ref 4.22–5.81)
RDW: 13.6 % (ref 11.5–15.5)
WBC: 5.2 10*3/uL (ref 4.0–10.5)

## 2016-12-23 MED ORDER — INSULIN ASPART 100 UNIT/ML ~~LOC~~ SOLN
0.0000 [IU] | Freq: Three times a day (TID) | SUBCUTANEOUS | Status: DC
  Administered 2016-12-23 – 2016-12-24 (×2): 2 [IU] via SUBCUTANEOUS

## 2016-12-23 MED ORDER — INSULIN ASPART 100 UNIT/ML ~~LOC~~ SOLN: 0.0000 [IU] | Freq: Every day | SUBCUTANEOUS | Status: DC

## 2016-12-23 MED ORDER — INSULIN GLARGINE 100 UNIT/ML ~~LOC~~ SOLN
20.0000 [IU] | Freq: Every day | SUBCUTANEOUS | Status: DC
  Administered 2016-12-23: 20 [IU] via SUBCUTANEOUS
  Filled 2016-12-23 (×2): qty 0.2

## 2016-12-23 MED ORDER — SODIUM CHLORIDE 0.9 % IV SOLN
INTRAVENOUS | Status: DC
  Administered 2016-12-23: 08:00:00 via INTRAVENOUS
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
## 2016-12-23 NOTE — Discharge Instructions (Signed)
Indwelling Urinary Catheter Care, Adult  Take good care of your catheter to keep it working and to prevent problems.  How to wear your catheter  Attach your catheter to your leg with tape (adhesive tape) or a leg strap. Make sure it is not too tight. If you use tape, remove any bits of tape that are already on the catheter.  How to wear a drainage bag  You should have:   A large overnight bag.   A small leg bag.    Overnight Bag  You may wear the overnight bag at any time. Always keep the bag below the level of your bladder but off the floor. When you sleep, put a clean plastic bag in a wastebasket. Then hang the bag inside the wastebasket.  Leg Bag  Never wear the leg bag at night. Always wear the leg bag below your knee. Keep the leg bag secure with a leg strap or tape.  How to care for your skin   Clean the skin around the catheter at least once every day.   Shower every day. Do not take baths.   Put creams, lotions, or ointments on your genital area only as told by your doctor.   Do not use powders, sprays, or lotions on your genital area.  How to clean your catheter and your skin  1. Wash your hands with soap and water.  2. Wet a washcloth in warm water and gentle (mild) soap.  3. Use the washcloth to clean the skin where the catheter enters your body. Clean downward and wipe away from the catheter in small circles. Do not wipe toward the catheter.  4. Pat the area dry with a clean towel. Make sure to clean off all soap.  How to care for your drainage bags  Empty your drainage bag when it is ?- full or at least 2-3 times a day. Replace your drainage bag once a month or sooner if it starts to smell bad or look dirty. Do not clean your drainage bag unless told by your doctor.  Emptying a drainage bag    Supplies Needed   Rubbing alcohol.   Gauze pad or cotton ball.   Tape or a leg strap.    Steps  1. Wash your hands with soap and water.  2. Separate (detach) the bag from your leg.  3. Hold the bag over  the toilet or a clean container. Keep the bag below your hips and bladder. This stops pee (urine) from going back into the tube.  4. Open the pour spout at the bottom of the bag.  5. Empty the pee into the toilet or container. Do not let the pour spout touch any surface.  6. Put rubbing alcohol on a gauze pad or cotton ball.  7. Use the gauze pad or cotton ball to clean the pour spout.  8. Close the pour spout.  9. Attach the bag to your leg with tape or a leg strap.  10. Wash your hands.    Changing a drainage bag  Supplies Needed   Alcohol wipes.   A clean drainage bag.   Adhesive tape or a leg strap.    Steps  1. Wash your hands with soap and water.  2. Separate the dirty bag from your leg.  3. Pinch the rubber catheter with your fingers so that pee does not spill out.  4. Separate the catheter tube from the drainage tube where these tubes connect (at the   connection valve). Do not let the tubes touch any surface.  5. Clean the end of the catheter tube with an alcohol wipe. Use a different alcohol wipe to clean the end of the drainage tube.  6. Connect the catheter tube to the drainage tube of the clean bag.  7. Attach the new bag to the leg with adhesive tape or a leg strap.  8. Wash your hands.    How to prevent infection and other problems   Never pull on your catheter or try to remove it. Pulling can damage tissue in your body.   Always wash your hands before and after touching your catheter.   If a leg strap gets wet, replace it with a dry one.   Drink enough fluids to keep your pee clear or pale yellow, or as told by your doctor.   Do not let the drainage bag or tubing touch the floor.   Wear cotton underwear.   If you are male, wipe from front to back after you poop (have a bowel movement).   Check on the catheter often to make sure it works and the tubing is not twisted.  Get help if:   Your pee is cloudy.   Your pee smells unusually bad.   Your pee is not draining into the bag.   Your  tube gets clogged.   Your catheter starts to leak.   Your bladder feels full.  Get help right away if:   You have redness, swelling, or pain where the catheter enters your body.   You have fluid, pus, or a bad smell coming from the area where the catheter enters your body.   The area where the catheter enters your body feels warm.   You have a fever.   You have pain in your:  ? Stomach (abdomen).  ? Legs.  ? Lower back.  ? Bladder.   You see blood fill the catheter.   Your pee is pink or red.   You feel sick to your stomach (nauseous).   You throw up (vomit).   You have chills.   Your catheter gets pulled out.  This information is not intended to replace advice given to you by your health care provider. Make sure you discuss any questions you have with your health care provider.  Document Released: 07/11/2012 Document Revised: 02/12/2016 Document Reviewed: 08/29/2013  [employer] Interactive Patient Education  2018 [employer]
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
## 2016-12-23 NOTE — Progress Notes (Signed)
Inpatient Diabetes Program Recommendations  AACE/ADA: New Consensus Statement on Inpatient Glycemic Control (2015)  Target Ranges:  Prepandial:   less than 140 mg/dL      Peak postprandial:   less than 180 mg/dL (1-2 hours)      Critically ill patients:  140 - 180 mg/dL   Lab Results  Component Value Date   GLUCAP 65 12/23/2016   HGBA1C 13.5 (H) 08/27/2016   Review of Glycemic Control  Diabetes history: DM 2 Outpatient Diabetes medications: Lantus 30 units, Novolog 0-9 units tid Current orders for Inpatient glycemic control: Lantus 20 units, Novolog Moderate Correction 0-15 units + Novolog HS scale 0-5 units  Inpatient Diabetes Program Recommendations:    Glucose this am 306 was covered with 11 units of Novolog and had hypoglycemia. Please consider reducing correction scale to Novolog Sensitive Correction 0-9 units tid, this is the scale patient uses at home. Patient also has slight increase in renal function.  Thanks,  Tama Headings RN, MSN, [facility] Inpatient Diabetes Coordinator Team Pager [phone] (8a-5p)

## 2016-12-24 LAB — COMPREHENSIVE METABOLIC PANEL
ALBUMIN: 2.5 g/dL — AB (ref 3.5–5.0)
ALK PHOS: 65 U/L (ref 38–126)
ALT: 19 U/L (ref 17–63)
ANION GAP: 8 (ref 5–15)
AST: 32 U/L (ref 15–41)
BILIRUBIN TOTAL: 0.8 mg/dL (ref 0.3–1.2)
BUN: 15 mg/dL (ref 6–20)
CO2: 21 mmol/L — ABNORMAL LOW (ref 22–32)
Calcium: 7.9 mg/dL — ABNORMAL LOW (ref 8.9–10.3)
Chloride: 107 mmol/L (ref 101–111)
Creatinine, Ser: 1.48 mg/dL — ABNORMAL HIGH (ref 0.61–1.24)
GFR calc non Af Amer: 48 mL/min — ABNORMAL LOW (ref >60)
GFR, EST AFRICAN AMERICAN: 55 mL/min — AB (ref >60)
GLUCOSE: 196 mg/dL — AB (ref 65–99)
Potassium: 4 mmol/L (ref 3.5–5.1)
Sodium: 136 mmol/L (ref 135–145)
Total Protein: 5.4 g/dL — ABNORMAL LOW (ref 6.5–8.1)

## 2016-12-24 LAB — GLUCOSE, CAPILLARY
GLUCOSE-CAPILLARY: 193 mg/dL — AB (ref 65–99)
Glucose-Capillary: 194 mg/dL — ABNORMAL HIGH (ref 65–99)

## 2016-12-24 LAB — CBC
HEMATOCRIT: 27.6 % — AB (ref 39.0–52.0)
HEMOGLOBIN: 9.4 g/dL — AB (ref 13.0–17.0)
MCH: 30.8 pg (ref 26.0–34.0)
MCHC: 34.1 g/dL (ref 30.0–36.0)
MCV: 90.5 fL (ref 78.0–100.0)
Platelets: 154 10*3/uL (ref 150–400)
RBC: 3.05 MIL/uL — ABNORMAL LOW (ref 4.22–5.81)
RDW: 13.7 % (ref 11.5–15.5)
WBC: 5.4 10*3/uL (ref 4.0–10.5)

## 2016-12-24 MED ORDER — INSULIN GLARGINE 100 UNIT/ML ~~LOC~~ SOLN: 20.0000 [IU] | Freq: Every evening | SUBCUTANEOUS | 11 refills | Status: DC

## 2016-12-24 MED ORDER — ACETAMINOPHEN 325 MG PO TABS: 650.0000 mg | ORAL_TABLET | Freq: Four times a day (QID) | ORAL | Status: DC | PRN
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
## 2016-12-24 NOTE — Progress Notes (Signed)
Foley care and emptying demonstrated to pt. Connected it to a leg bag, a bedside bag provided. Discharge instructions given to pt, verbalized understanding. Discharged to home.
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
## 2016-12-30 DIAGNOSIS — R339 Retention of urine, unspecified: Secondary | ICD-10-CM | POA: Diagnosis not present

## 2016-12-31 DIAGNOSIS — R339 Retention of urine, unspecified: Secondary | ICD-10-CM | POA: Diagnosis not present

## 2016-12-31 DIAGNOSIS — R8271 Bacteriuria: Secondary | ICD-10-CM | POA: Diagnosis not present

## 2017-01-29 DIAGNOSIS — E108 Type 1 diabetes mellitus with unspecified complications: Secondary | ICD-10-CM | POA: Diagnosis not present

## 2017-02-27 ENCOUNTER — Emergency Department (HOSPITAL_COMMUNITY)
Admission: EM | Admit: 2017-02-27 | Discharge: 2017-02-27 | Disposition: A | Discharge status: Home / Self Care | Payer: PPO | Attending: Emergency Medicine | Admitting: Emergency Medicine

## 2017-02-27 ENCOUNTER — Encounter (HOSPITAL_COMMUNITY): Payer: Self-pay | Admitting: Emergency Medicine

## 2017-02-27 ENCOUNTER — Emergency Department (HOSPITAL_COMMUNITY): Payer: PPO

## 2017-02-27 DIAGNOSIS — F17210 Nicotine dependence, cigarettes, uncomplicated: Secondary | ICD-10-CM | POA: Insufficient documentation

## 2017-02-27 DIAGNOSIS — T50901A Poisoning by unspecified drugs, medicaments and biological substances, accidental (unintentional), initial encounter: Secondary | ICD-10-CM

## 2017-02-27 DIAGNOSIS — I10 Essential (primary) hypertension: Secondary | ICD-10-CM | POA: Diagnosis not present

## 2017-02-27 DIAGNOSIS — J329 Chronic sinusitis, unspecified: Secondary | ICD-10-CM | POA: Diagnosis not present

## 2017-02-27 DIAGNOSIS — Z79899 Other long term (current) drug therapy: Secondary | ICD-10-CM | POA: Diagnosis not present

## 2017-02-27 DIAGNOSIS — J019 Acute sinusitis, unspecified: Secondary | ICD-10-CM | POA: Insufficient documentation

## 2017-02-27 DIAGNOSIS — E11649 Type 2 diabetes mellitus with hypoglycemia without coma: Secondary | ICD-10-CM | POA: Insufficient documentation

## 2017-02-27 DIAGNOSIS — E161 Other hypoglycemia: Secondary | ICD-10-CM | POA: Diagnosis not present

## 2017-02-27 DIAGNOSIS — S0990XA Unspecified injury of head, initial encounter: Secondary | ICD-10-CM | POA: Diagnosis not present

## 2017-02-27 DIAGNOSIS — Z794 Long term (current) use of insulin: Secondary | ICD-10-CM | POA: Insufficient documentation

## 2017-02-27 DIAGNOSIS — E162 Hypoglycemia, unspecified: Secondary | ICD-10-CM | POA: Diagnosis not present

## 2017-02-27 DIAGNOSIS — S199XXA Unspecified injury of neck, initial encounter: Secondary | ICD-10-CM | POA: Diagnosis not present

## 2017-02-27 DIAGNOSIS — R4182 Altered mental status, unspecified: Secondary | ICD-10-CM | POA: Insufficient documentation

## 2017-02-27 DIAGNOSIS — T383X1A Poisoning by insulin and oral hypoglycemic [antidiabetic] drugs, accidental (unintentional), initial encounter: Secondary | ICD-10-CM | POA: Insufficient documentation

## 2017-02-27 DIAGNOSIS — R9431 Abnormal electrocardiogram [ECG] [EKG]: Secondary | ICD-10-CM | POA: Diagnosis not present

## 2017-02-27 LAB — COMPREHENSIVE METABOLIC PANEL
ALBUMIN: 3.2 g/dL — AB (ref 3.5–5.0)
ALT: 19 U/L (ref 17–63)
AST: 24 U/L (ref 15–41)
Alkaline Phosphatase: 81 U/L (ref 38–126)
Anion gap: 8 (ref 5–15)
BILIRUBIN TOTAL: 0.4 mg/dL (ref 0.3–1.2)
BUN: 28 mg/dL — AB (ref 6–20)
CO2: 27 mmol/L (ref 22–32)
CREATININE: 1.37 mg/dL — AB (ref 0.61–1.24)
Calcium: 9.1 mg/dL (ref 8.9–10.3)
Chloride: 102 mmol/L (ref 101–111)
GFR calc Af Amer: >60 mL/min (ref >60)
GFR calc non Af Amer: 52 mL/min — ABNORMAL LOW (ref >60)
GLUCOSE: 61 mg/dL — AB (ref 65–99)
POTASSIUM: 2.9 mmol/L — AB (ref 3.5–5.1)
Sodium: 137 mmol/L (ref 135–145)
TOTAL PROTEIN: 6.8 g/dL (ref 6.5–8.1)

## 2017-02-27 LAB — PROTIME-INR
INR: 0.94
Prothrombin Time: 12.5 seconds (ref 11.4–15.2)

## 2017-02-27 LAB — I-STAT TROPONIN, ED: Troponin i, poc: 0.01 ng/mL (ref 0.00–0.08)

## 2017-02-27 LAB — CBC WITH DIFFERENTIAL/PLATELET
BASOS ABS: 0 10*3/uL (ref 0.0–0.1)
BASOS PCT: 0 %
Eosinophils Absolute: 0.2 10*3/uL (ref 0.0–0.7)
Eosinophils Relative: 3 %
HEMATOCRIT: 34.8 % — AB (ref 39.0–52.0)
HEMOGLOBIN: 11.6 g/dL — AB (ref 13.0–17.0)
LYMPHS PCT: 24 %
Lymphs Abs: 1.2 10*3/uL (ref 0.7–4.0)
MCH: 30.2 pg (ref 26.0–34.0)
MCHC: 33.3 g/dL (ref 30.0–36.0)
MCV: 90.6 fL (ref 78.0–100.0)
MONO ABS: 0.4 10*3/uL (ref 0.1–1.0)
Monocytes Relative: 7 %
NEUTROS ABS: 3.2 10*3/uL (ref 1.7–7.7)
NEUTROS PCT: 66 %
Platelets: 199 10*3/uL (ref 150–400)
RBC: 3.84 MIL/uL — AB (ref 4.22–5.81)
RDW: 14 % (ref 11.5–15.5)
WBC: 5 10*3/uL (ref 4.0–10.5)

## 2017-02-27 LAB — URINALYSIS, ROUTINE W REFLEX MICROSCOPIC
Bacteria, UA: NONE SEEN
Bilirubin Urine: NEGATIVE
Glucose, UA: 50 mg/dL — AB
KETONES UR: NEGATIVE mg/dL
Leukocytes, UA: NEGATIVE
Nitrite: NEGATIVE
PH: 5 (ref 5.0–8.0)
PROTEIN: 30 mg/dL — AB
Specific Gravity, Urine: 1.009 (ref 1.005–1.030)

## 2017-02-27 LAB — CBG MONITORING, ED
GLUCOSE-CAPILLARY: 61 mg/dL — AB (ref 65–99)
GLUCOSE-CAPILLARY: 90 mg/dL (ref 65–99)
GLUCOSE-CAPILLARY: 131 mg/dL — AB (ref 65–99)
Glucose-Capillary: 50 mg/dL — ABNORMAL LOW (ref 65–99)
Glucose-Capillary: 131 mg/dL — ABNORMAL HIGH (ref 65–99)

## 2017-02-27 LAB — APTT: aPTT: 32 seconds (ref 24–36)

## 2017-02-27 LAB — ETHANOL: Alcohol, Ethyl (B): <10 mg/dL (ref <10)

## 2017-02-27 LAB — RAPID URINE DRUG SCREEN, HOSP PERFORMED
Amphetamines: NOT DETECTED
BENZODIAZEPINES: NOT DETECTED
Barbiturates: NOT DETECTED
COCAINE: NOT DETECTED
OPIATES: NOT DETECTED
Tetrahydrocannabinol: NOT DETECTED

## 2017-02-27 MED ORDER — DEXTROSE 50 % IV SOLN
INTRAVENOUS | Status: AC
  Administered 2017-02-27: 50 mL
  Filled 2017-02-27: qty 50

## 2017-02-27 MED ORDER — AMOXICILLIN-POT CLAVULANATE 875-125 MG PO TABS
1.0000 | ORAL_TABLET | Freq: Once | ORAL | Status: AC
  Administered 2017-02-27: 1 via ORAL
  Filled 2017-02-27: qty 1

## 2017-02-27 MED ORDER — AMOXICILLIN-POT CLAVULANATE 875-125 MG PO TABS: 1.0000 | ORAL_TABLET | Freq: Two times a day (BID) | ORAL | 0 refills | Status: DC

## 2017-02-27 NOTE — ED Notes (Signed)
Pt and family verbalized understanding of discharge paperwork and instructions.

## 2017-02-27 NOTE — Discharge Instructions (Signed)
Please continue to closely monitor mental status- check that he is awake and acting normally.  Check blood sugar every hour.  Eat small frequent meals. Resume normal blood sugar medications in the morning.

## 2017-02-27 NOTE — ED Notes (Signed)
Patient transported to CT 

## 2017-02-27 NOTE — ED Triage Notes (Signed)
Pt from home with family. Called out for unwitnessed fall, patient broke his glasses. Pt was up and acting normal prior to fall, was found on floor. CBG was 20. Given d50. CBg 111 on arrival, the patient is still unresponsive. Only responds to painful stimuli, some combativeness. L hand 20G PIV started by EMS. EMS unsure if pt on blood thinners.
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
## 2019-04-26 ENCOUNTER — Other Ambulatory Visit: Payer: Self-pay | Admitting: Internal Medicine

## 2019-04-26 DIAGNOSIS — N184 Chronic kidney disease, stage 4 (severe): Secondary | ICD-10-CM

## 2019-05-10 ENCOUNTER — Ambulatory Visit
Admission: RE | Admit: 2019-05-10 | Discharge: 2019-05-10 | Disposition: A | Discharge status: Home / Self Care | Payer: Medicare Other | Source: Ambulatory Visit | Attending: Internal Medicine | Admitting: Internal Medicine

## 2019-05-10 DIAGNOSIS — N184 Chronic kidney disease, stage 4 (severe): Secondary | ICD-10-CM

## 2019-09-13 ENCOUNTER — Other Ambulatory Visit (HOSPITAL_COMMUNITY): Payer: Self-pay | Admitting: Nephrology

## 2019-09-13 DIAGNOSIS — N184 Chronic kidney disease, stage 4 (severe): Secondary | ICD-10-CM

## 2019-09-22 ENCOUNTER — Other Ambulatory Visit: Payer: Self-pay | Admitting: Student

## 2019-09-25 ENCOUNTER — Other Ambulatory Visit: Payer: Self-pay

## 2019-09-25 ENCOUNTER — Ambulatory Visit (HOSPITAL_COMMUNITY)
Admission: RE | Admit: 2019-09-25 | Discharge: 2019-09-25 | Disposition: A | Discharge status: Home / Self Care | Payer: Medicare Other | Source: Ambulatory Visit | Attending: Nephrology | Admitting: Nephrology

## 2019-09-25 ENCOUNTER — Encounter (HOSPITAL_COMMUNITY): Payer: Self-pay

## 2019-09-25 DIAGNOSIS — F17210 Nicotine dependence, cigarettes, uncomplicated: Secondary | ICD-10-CM | POA: Insufficient documentation

## 2019-09-25 DIAGNOSIS — N184 Chronic kidney disease, stage 4 (severe): Secondary | ICD-10-CM | POA: Diagnosis present

## 2019-09-25 DIAGNOSIS — R600 Localized edema: Secondary | ICD-10-CM | POA: Insufficient documentation

## 2019-09-25 DIAGNOSIS — I129 Hypertensive chronic kidney disease with stage 1 through stage 4 chronic kidney disease, or unspecified chronic kidney disease: Secondary | ICD-10-CM | POA: Insufficient documentation

## 2019-09-25 DIAGNOSIS — Z79899 Other long term (current) drug therapy: Secondary | ICD-10-CM | POA: Diagnosis not present

## 2019-09-25 DIAGNOSIS — Z7982 Long term (current) use of aspirin: Secondary | ICD-10-CM | POA: Diagnosis not present

## 2019-09-25 DIAGNOSIS — E1122 Type 2 diabetes mellitus with diabetic chronic kidney disease: Secondary | ICD-10-CM | POA: Insufficient documentation

## 2019-09-25 DIAGNOSIS — R809 Proteinuria, unspecified: Secondary | ICD-10-CM | POA: Diagnosis not present

## 2019-09-25 DIAGNOSIS — F17200 Nicotine dependence, unspecified, uncomplicated: Secondary | ICD-10-CM | POA: Insufficient documentation

## 2019-09-25 DIAGNOSIS — Z794 Long term (current) use of insulin: Secondary | ICD-10-CM | POA: Diagnosis not present

## 2019-09-25 LAB — CBC
HCT: 29.8 % — ABNORMAL LOW (ref 39.0–52.0)
Hemoglobin: 9.8 g/dL — ABNORMAL LOW (ref 13.0–17.0)
MCH: 31.3 pg (ref 26.0–34.0)
MCHC: 32.9 g/dL (ref 30.0–36.0)
MCV: 95.2 fL (ref 80.0–100.0)
Platelets: 200 10*3/uL (ref 150–400)
RBC: 3.13 MIL/uL — ABNORMAL LOW (ref 4.22–5.81)
RDW: 14.9 % (ref 11.5–15.5)
WBC: 4.2 10*3/uL (ref 4.0–10.5)
nRBC: 0 % (ref 0.0–0.2)

## 2019-09-25 LAB — GLUCOSE, CAPILLARY
Glucose-Capillary: 93 mg/dL (ref 70–99)
Glucose-Capillary: 90 mg/dL (ref 70–99)

## 2019-09-25 LAB — PROTIME-INR
INR: 1.1 (ref 0.8–1.2)
Prothrombin Time: 13.4 seconds (ref 11.4–15.2)

## 2019-09-25 MED ORDER — MIDAZOLAM HCL 2 MG/2ML IJ SOLN
INTRAMUSCULAR | Status: AC
  Filled 2019-09-25: qty 2

## 2019-09-25 MED ORDER — FENTANYL CITRATE (PF) 100 MCG/2ML IJ SOLN
INTRAMUSCULAR | Status: AC
  Filled 2019-09-25: qty 2

## 2019-09-25 MED ORDER — FENTANYL CITRATE (PF) 100 MCG/2ML IJ SOLN
INTRAMUSCULAR | Status: AC | PRN
  Administered 2019-09-25: 50 ug via INTRAVENOUS

## 2019-09-25 MED ORDER — MIDAZOLAM HCL 2 MG/2ML IJ SOLN
INTRAMUSCULAR | Status: AC | PRN
  Administered 2019-09-25: 1 mg via INTRAVENOUS

## 2019-09-25 MED ORDER — SODIUM CHLORIDE 0.9 % IV SOLN: INTRAVENOUS | Status: DC

## 2019-09-25 MED ORDER — GELATIN ABSORBABLE 12-7 MM EX MISC
CUTANEOUS | Status: AC
  Filled 2019-09-25: qty 1

## 2019-09-25 MED ORDER — LIDOCAINE HCL (PF) 1 % IJ SOLN
INTRAMUSCULAR | Status: AC
  Filled 2019-09-25: qty 10

## 2019-09-25 MED ORDER — HYDRALAZINE HCL 20 MG/ML IJ SOLN
INTRAMUSCULAR | Status: AC
  Filled 2019-09-25: qty 1

## 2019-09-25 MED ORDER — HYDRALAZINE HCL 20 MG/ML IJ SOLN
INTRAMUSCULAR | Status: AC | PRN
  Administered 2019-09-25: 10 mg via INTRAVENOUS

## 2019-09-25 NOTE — Discharge Instructions (Addendum)
Percutaneous Kidney Biopsy, Care After This sheet gives you information about how to care for yourself after your procedure. Your health care provider may also give you more specific instructions. If you have problems or questions, contact your health care provider. What can I expect after the procedure? After the procedure, it is common to have:  Pain or soreness near the biopsy site.  Pink or cloudy urine for 24 hours after the procedure. Follow these instructions at home: Activity  Return to your normal activities as told by your health care provider. Ask your health care provider what activities are safe for you.  If you were given a sedative during the procedure, it can affect you for several hours. Do not drive or operate machinery until your health care provider says that it is safe.  Do not lift anything that is heavier than 10 lb (4.5 kg), or the limit that you are told, until your health care provider says that it is safe.  Avoid activities that take a lot of effort (are strenuous) until your health care provider approves. Most people will have to wait 2 weeks before returning to activities such as exercise or sex. General instructions   Take over-the-counter and prescription medicines only as told by your health care provider.  You may eat and drink after your procedure. Follow instructions from your health care provider about eating or drinking restrictions.  Check your biopsy site every day for signs of infection. Check for: ? More redness, swelling, or pain. ? Fluid or blood. ? Warmth. ? Pus or a bad smell.  Keep all follow-up visits as told by your health care provider. This is important. Contact a health care provider if:  You have more redness, swelling, or pain around your biopsy site.  You have fluid or blood coming from your biopsy site.  Your biopsy site feels warm to the touch.  You have pus or a bad smell coming from your biopsy site.  You have blood  in your urine more than 24 hours after your procedure.  You have a fever. Get help right away if:  Your urine is dark red or brown.  You cannot urinate.  It burns when you urinate.  You feel dizzy or light-headed.  You have severe pain in your abdomen or side. Summary  After the procedure, it is common to have pain or soreness at the biopsy site and pink or cloudy urine for the first 24 hours.  Check your biopsy site each day for signs of infection, such as more redness, swelling, or pain; fluid, blood, pus or a bad smell coming from the biopsy site; or the biopsy site feeling warm to touch.  Return to your normal activities as told by your health care provider. This information is not intended to replace advice given to you by your health care provider. Make sure you discuss any questions you have with your health care provider. Document Revised: 11/17/2018 Document Reviewed: 11/17/2018 Elsevier Patient Education  2020 [employer] Inc[employer]    Moderate Conscious Sedation, Adult, Care After These instructions provide you with information about caring for yourself after your procedure. Your health care provider may also give you more specific instructions. Your treatment has been planned according to current medical practices, but problems sometimes occur. Call your health care provider if you have any problems or questions after your procedure. What can I expect after the procedure? After your procedure, it is common:  To feel sleepy for several hours.  To   feel clumsy and have poor balance for several hours.  To have poor judgment for several hours.  To vomit if you eat too soon. Follow these instructions at home: For at least 24 hours after the procedure:   Do not: ? Participate in activities where you could fall or become injured. ? Drive. ? Use heavy machinery. ? Drink alcohol. ? Take sleeping pills or medicines that cause drowsiness. ? Make important decisions or sign  legal documents. ? Take care of children on your own.  Rest. Eating and drinking  Follow the diet recommended by your health care provider.  If you vomit: ? Drink water, juice, or soup when you can drink without vomiting. ? Make sure you have little or no nausea before eating solid foods. General instructions  Have a responsible adult stay with you until you are awake and alert.  Take over-the-counter and prescription medicines only as told by your health care provider.  If you smoke, do not smoke without supervision.  Keep all follow-up visits as told by your health care provider. This is important. Contact a health care provider if:  You keep feeling nauseous or you keep vomiting.  You feel light-headed.  You develop a rash.  You have a fever. Get help right away if:  You have trouble breathing. This information is not intended to replace advice given to you by your health care provider. Make sure you discuss any questions you have with your health care provider. Document Revised: 02/26/2017 Document Reviewed: 07/06/2015 Elsevier Patient Education  2020 [employer] Inc.  

## 2019-09-25 NOTE — H&P (Signed)
Chief Complaint: Patient was seen in consultation today for random renal biopsy at the request of Patel,Jay  Referring Physician(s): Patel,Jay  Supervising Physician: Corrie Mckusick  Patient Status: [facility] - Out-pt  History of Present Illness: Tyler Patterson is a 69 y.o. 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 Alcohol abuse   . Diabetes mellitus   . GERD (gastroesophageal reflux disease)   . Headache 05/13/2013  . Headache(784.0)   . Hypertension   . Seizures ([place])   . Shortness of breath     Past Surgical History:  Procedure Laterality Date  . HERNIA REPAIR      Allergies: Fiorinal [butalbital-aspirin-caffeine]  Medications: Prior to Admission medications   Medication Sig Start Date End Date Taking? Authorizing Provider  aspirin EC 81 MG tablet Take 81 mg by mouth daily. Swallow whole.   Yes [provider]  atorvastatin (LIPITOR) 40 MG tablet Take 20 mg by mouth daily.   Yes [provider]  carvedilol (COREG) 25 MG tablet Take 25 mg by mouth 2 (two) times daily with a meal.   Yes [provider]  Cholecalciferol (VITAMIN D) 50 MCG (2000 UT) tablet Take 2,000 Units by mouth daily.   Yes [provider]  furosemide (LASIX) 20 MG tablet Take 20 mg by mouth daily.   Yes [provider]  hydrALAZINE (APRESOLINE) 50 MG tablet Take 50 mg by mouth daily.   Yes [provider]  insulin aspart (NOVOLOG) 100 UNIT/ML injection Before each meal 3 times a day, 140-199 - 2 units, 200-250 - 4 units, 251-299 - 6 units,  300-349 - 8 units,  350 or above 10 units. Dispense syringes and needles as needed, Ok to switch to PEN if approved. Substitute to any brand approved. DX DM2, Code  E11.65 Patient taking differently: Inject 4 Units into the skin in the morning and at bedtime. Before each meal 3 times a day, 140-199 - 2 units, 200-250 - 4 units, 251-299 - 6 units,  300-349 - 8 units,  350 or above 10 units. Dispense syringes and needles as needed, Ok to switch to PEN if approved. Substitute to any brand approved. DX DM2, Code E11.65 08/28/16  Yes Thurnell Lose, MD  insulin glargine (LANTUS) 100 UNIT/ML injection Inject 0.2 mLs (20 Units total) into the skin every evening. Patient taking differently: Inject 27 Units into the skin every evening.  12/24/16  Yes Simaan, Darci Current, PA-C  losartan (COZAAR) 100 MG tablet Take 50 mg by mouth daily.   Yes [provider]  pregabalin (LYRICA) 150 MG capsule Take 150 mg by mouth 2 (two) times daily.   Yes [provider]  thiamine 100 MG tablet Take 1 tablet (100 mg total) by mouth daily. 10/05/14  Yes Reyne Dumas, MD  acetaminophen (TYLENOL) 325 MG tablet Take 2 tablets (650 mg total) by mouth every 6 (six) hours as needed. 12/24/16   Jill Alexanders, PA-C     Family History  Problem Relation Age of Onset  . Diabetes Mellitus II Neg Hx     Social History   Socioeconomic History  . Marital status: Married    Spouse name: Not on file  . Number of children: Not on file  . Years of education: Not  on file  . Highest education level: Not on file  Occupational History  . Not on file  Tobacco Use  . Smoking status: Current Some Day Smoker    Packs/day: 1.00    Types: Cigars, Cigarettes  . Smokeless tobacco: Never Used  . Tobacco comment: smokes black & mild cigars on the weekends  Vaping Use  . Vaping Use: Never used  Substance and Sexual Activity  . Alcohol use: Not Currently    Comment: drinks every day  . Drug use: No  . Sexual activity: Not Currently  Other Topics Concern  . Not on file  Social History Narrative  . Not on file   Social Determinants of Health   Financial Resource Strain:   .  Difficulty of Paying Living Expenses:   Food Insecurity:   . Worried About Charity fundraiser in the Last Year:   . Arboriculturist in the Last Year:   Transportation Needs:   . Film/video editor (Medical):   Marland Kitchen Lack of Transportation (Non-Medical):   Physical Activity:   . Days of Exercise per Week:   . Minutes of Exercise per Session:   Stress:   . Feeling of Stress :   Social Connections:   . Frequency of Communication with Friends and Family:   . Frequency of Social Gatherings with Friends and Family:   . Attends Religious Services:   . Active Member of Clubs or Organizations:   . Attends Archivist Meetings:   Marland Kitchen Marital Status:      Review of Systems: A 12 point ROS discussed and pertinent positives are indicated in the HPI above.  All other systems are negative.  Review of Systems  Constitutional: Positive for activity change. Negative for fatigue and fever.  Respiratory: Negative for cough and shortness of breath.   Cardiovascular: Positive for leg swelling. Negative for chest pain.  Gastrointestinal: Negative for abdominal pain and nausea.  Neurological: Negative for weakness.  Psychiatric/Behavioral: Negative for behavioral problems and confusion.    Vital Signs: BP (!) 161/80   Pulse 75   Temp 97.9 F (36.6 C) (Oral)   Resp 16   Ht 5\' 9"  (0.174 m)   Wt 212 lb (96.2 kg)   SpO2 100%   BMI 31.31 kg/m   Physical Exam Vitals reviewed.  Cardiovascular:     Rate and Rhythm: Normal rate and regular rhythm.     Heart sounds: Normal heart sounds.  Pulmonary:     Effort: Pulmonary effort is normal.     Breath sounds: Normal breath sounds.  Abdominal:     Palpations: Abdomen is soft.  Musculoskeletal:        General: Normal range of motion.     Right lower leg: Edema present.     Left lower leg: Edema present.  Skin:    General: Skin is warm.  Neurological:     Mental Status: He is alert and oriented to person, place, and time.  Psychiatric:         Behavior: Behavior normal.     Imaging: No results found.  Labs:  CBC: No results for input(s): WBC, HGB, HCT, PLT in the last 8760 hours.  COAGS: Recent Labs    09/25/19 0548  INR 1.1    BMP: No results for input(s): NA, K, CL, CO2, GLUCOSE, BUN, CALCIUM, CREATININE, GFRNONAA, GFRAA in the last 8760 hours.  Invalid input(s): CMP  LIVER FUNCTION TESTS: No results for input(s): BILITOT, AST, ALT,  ALKPHOS, PROT, ALBUMIN in the last 8760 hours.  TUMOR MARKERS: No results for input(s): AFPTM, CEA, CA199, CHROMGRNA in the last 8760 hours.  Assessment and Plan:  Progressive CKD Proteinuria B LLE edema Scheduled for random renal biopsy Risks and benefits of random renal biopsy was discussed with the patient and/or patient's family including, but not limited to bleeding, infection, damage to adjacent structures or low yield requiring additional tests.  All of the questions were answered and there is agreement to proceed.  Consent signed and in chart.   Thank you for this interesting consult.  I greatly enjoyed meeting Tyler Patterson and look forward to participating in their care.  A copy of this report was sent to the requesting provider on this date.  Electronically Signed: Lavonia Drafts, PA-C 09/25/2019, 6:54 AM   I spent a total of  30 Minutes   in face to face in clinical consultation, greater than 50% of which was counseling/coordinating care for random renal biopsy

## 2019-09-25 NOTE — Procedures (Signed)
Interventional Radiology Procedure Note  Procedure: US guided left kidney biopsy, medical renal Complications: None EBL: None Recommendations: - 2 hours bedrest - follow up pathology - advance diet - routine wound care  Signed,  Dulcy Fanny. Earleen Newport, DO

## 2019-09-25 NOTE — Progress Notes (Signed)
Pt voided prior to discharge, clear urine noted

## 2019-10-06 LAB — SURGICAL PATHOLOGY

## 2019-10-10 ENCOUNTER — Encounter (HOSPITAL_COMMUNITY): Payer: Self-pay

## 2019-12-06 ENCOUNTER — Other Ambulatory Visit (HOSPITAL_COMMUNITY): Payer: Self-pay | Admitting: *Deleted

## 2019-12-06 NOTE — Discharge Instructions (Signed)

## 2019-12-07 ENCOUNTER — Encounter (HOSPITAL_COMMUNITY): Payer: Self-pay

## 2019-12-07 ENCOUNTER — Inpatient Hospital Stay (HOSPITAL_COMMUNITY)
Admission: RE | Admit: 2019-12-07 | Discharge: 2019-12-07 | Disposition: A | Discharge status: Home / Self Care | Payer: Medicare Other | Source: Ambulatory Visit | Attending: Nephrology | Admitting: Nephrology

## 2019-12-12 ENCOUNTER — Encounter (HOSPITAL_COMMUNITY)
Admission: RE | Admit: 2019-12-12 | Discharge: 2019-12-12 | Disposition: A | Discharge status: Home / Self Care | Payer: Medicare Other | Source: Ambulatory Visit | Attending: Nephrology | Admitting: Nephrology

## 2019-12-12 ENCOUNTER — Other Ambulatory Visit: Payer: Self-pay

## 2019-12-12 DIAGNOSIS — N189 Chronic kidney disease, unspecified: Secondary | ICD-10-CM | POA: Insufficient documentation

## 2019-12-12 DIAGNOSIS — D631 Anemia in chronic kidney disease: Secondary | ICD-10-CM | POA: Diagnosis not present

## 2019-12-12 LAB — POCT HEMOGLOBIN-HEMACUE: Hemoglobin: 8.7 g/dL — ABNORMAL LOW (ref 13.0–17.0)

## 2019-12-12 MED ORDER — EPOETIN ALFA-EPBX 10000 UNIT/ML IJ SOLN
10000.0000 [IU] | Freq: Once | INTRAMUSCULAR | Status: AC
  Administered 2019-12-12: 10000 [IU] via SUBCUTANEOUS

## 2019-12-12 MED ORDER — EPOETIN ALFA-EPBX 10000 UNIT/ML IJ SOLN
INTRAMUSCULAR | Status: AC
  Filled 2019-12-12: qty 1

## 2019-12-21 ENCOUNTER — Encounter (HOSPITAL_COMMUNITY): Payer: Medicare Other

## 2019-12-26 ENCOUNTER — Encounter (HOSPITAL_COMMUNITY)
Admission: RE | Admit: 2019-12-26 | Discharge: 2019-12-26 | Disposition: A | Discharge status: Home / Self Care | Payer: Medicare Other | Source: Ambulatory Visit | Attending: Nephrology | Admitting: Nephrology

## 2019-12-26 ENCOUNTER — Other Ambulatory Visit: Payer: Self-pay

## 2019-12-26 DIAGNOSIS — N189 Chronic kidney disease, unspecified: Secondary | ICD-10-CM | POA: Diagnosis not present

## 2019-12-26 MED ORDER — EPOETIN ALFA-EPBX 10000 UNIT/ML IJ SOLN: 10000.0000 [IU] | Freq: Once | INTRAMUSCULAR | Status: AC

## 2019-12-26 MED ORDER — EPOETIN ALFA-EPBX 10000 UNIT/ML IJ SOLN
INTRAMUSCULAR | Status: AC
  Administered 2019-12-26: 10000 [IU] via SUBCUTANEOUS
  Filled 2019-12-26: qty 1

## 2019-12-27 LAB — POCT HEMOGLOBIN-HEMACUE: Hemoglobin: 11 g/dL — ABNORMAL LOW (ref 13.0–17.0)

## 2020-01-08 ENCOUNTER — Other Ambulatory Visit (HOSPITAL_COMMUNITY): Payer: Self-pay | Admitting: *Deleted

## 2020-01-09 ENCOUNTER — Encounter (HOSPITAL_COMMUNITY)
Admission: RE | Admit: 2020-01-09 | Discharge: 2020-01-09 | Disposition: A | Discharge status: Home / Self Care | Payer: Medicare Other | Source: Ambulatory Visit | Attending: Nephrology | Admitting: Nephrology

## 2020-01-09 ENCOUNTER — Other Ambulatory Visit: Payer: Self-pay

## 2020-01-09 DIAGNOSIS — N184 Chronic kidney disease, stage 4 (severe): Secondary | ICD-10-CM | POA: Diagnosis present

## 2020-01-09 DIAGNOSIS — D631 Anemia in chronic kidney disease: Secondary | ICD-10-CM | POA: Diagnosis not present

## 2020-01-09 LAB — CBC WITH DIFFERENTIAL/PLATELET
Abs Immature Granulocytes: 0.01 10*3/uL (ref 0.00–0.07)
Basophils Absolute: 0 10*3/uL (ref 0.0–0.1)
Basophils Relative: 1 %
Eosinophils Absolute: 0.3 10*3/uL (ref 0.0–0.5)
Eosinophils Relative: 7 %
HCT: 29 % — ABNORMAL LOW (ref 39.0–52.0)
Hemoglobin: 9.6 g/dL — ABNORMAL LOW (ref 13.0–17.0)
Immature Granulocytes: 0 %
Lymphocytes Relative: 29 %
Lymphs Abs: 1.2 10*3/uL (ref 0.7–4.0)
MCH: 30.3 pg (ref 26.0–34.0)
MCHC: 33.1 g/dL (ref 30.0–36.0)
MCV: 91.5 fL (ref 80.0–100.0)
Monocytes Absolute: 0.4 10*3/uL (ref 0.1–1.0)
Monocytes Relative: 10 %
Neutro Abs: 2.4 10*3/uL (ref 1.7–7.7)
Neutrophils Relative %: 53 %
Platelets: 166 10*3/uL (ref 150–400)
RBC: 3.17 MIL/uL — ABNORMAL LOW (ref 4.22–5.81)
RDW: 15.9 % — ABNORMAL HIGH (ref 11.5–15.5)
WBC: 4.4 10*3/uL (ref 4.0–10.5)
nRBC: 0 % (ref 0.0–0.2)

## 2020-01-09 LAB — IRON AND TIBC
Iron: 62 ug/dL (ref 45–182)
Saturation Ratios: 19 % (ref 17.9–39.5)
TIBC: 332 ug/dL (ref 250–450)
UIBC: 270 ug/dL

## 2020-01-09 LAB — POCT HEMOGLOBIN-HEMACUE: Hemoglobin: 9.9 g/dL — ABNORMAL LOW (ref 13.0–17.0)

## 2020-01-09 LAB — FERRITIN: Ferritin: 129 ng/mL (ref 24–336)

## 2020-01-09 MED ORDER — EPOETIN ALFA-EPBX 10000 UNIT/ML IJ SOLN
INTRAMUSCULAR | Status: AC
  Filled 2020-01-09: qty 1

## 2020-01-09 MED ORDER — EPOETIN ALFA-EPBX 10000 UNIT/ML IJ SOLN
10000.0000 [IU] | Freq: Once | INTRAMUSCULAR | Status: AC
  Administered 2020-01-09: 10000 [IU] via SUBCUTANEOUS

## 2020-01-22 ENCOUNTER — Other Ambulatory Visit (HOSPITAL_COMMUNITY): Payer: Self-pay | Admitting: *Deleted

## 2020-01-23 ENCOUNTER — Other Ambulatory Visit: Payer: Self-pay

## 2020-01-23 ENCOUNTER — Encounter (HOSPITAL_COMMUNITY)
Admission: RE | Admit: 2020-01-23 | Discharge: 2020-01-23 | Disposition: A | Discharge status: Home / Self Care | Payer: Medicare Other | Source: Ambulatory Visit | Attending: Nephrology | Admitting: Nephrology

## 2020-01-23 DIAGNOSIS — N184 Chronic kidney disease, stage 4 (severe): Secondary | ICD-10-CM | POA: Diagnosis not present

## 2020-01-23 LAB — POCT HEMOGLOBIN-HEMACUE: Hemoglobin: 10.5 g/dL — ABNORMAL LOW (ref 13.0–17.0)

## 2020-01-23 MED ORDER — EPOETIN ALFA-EPBX 10000 UNIT/ML IJ SOLN: 10000.0000 [IU] | Freq: Once | INTRAMUSCULAR | Status: AC

## 2020-01-23 MED ORDER — EPOETIN ALFA-EPBX 10000 UNIT/ML IJ SOLN
INTRAMUSCULAR | Status: AC
  Administered 2020-01-23: 10000 [IU] via SUBCUTANEOUS
  Filled 2020-01-23: qty 1

## 2020-02-06 ENCOUNTER — Other Ambulatory Visit: Payer: Self-pay

## 2020-02-06 ENCOUNTER — Encounter (HOSPITAL_COMMUNITY)
Admission: RE | Admit: 2020-02-06 | Discharge: 2020-02-06 | Disposition: A | Discharge status: Home / Self Care | Payer: Medicare Other | Source: Ambulatory Visit | Attending: Nephrology | Admitting: Nephrology

## 2020-02-06 DIAGNOSIS — N184 Chronic kidney disease, stage 4 (severe): Secondary | ICD-10-CM | POA: Diagnosis not present

## 2020-02-06 DIAGNOSIS — D631 Anemia in chronic kidney disease: Secondary | ICD-10-CM | POA: Diagnosis not present

## 2020-02-06 LAB — CBC WITH DIFFERENTIAL/PLATELET
Abs Immature Granulocytes: 0 10*3/uL (ref 0.00–0.07)
Basophils Absolute: 0 10*3/uL (ref 0.0–0.1)
Basophils Relative: 1 %
Eosinophils Absolute: 0.3 10*3/uL (ref 0.0–0.5)
Eosinophils Relative: 7 %
HCT: 33.2 % — ABNORMAL LOW (ref 39.0–52.0)
Hemoglobin: 10.9 g/dL — ABNORMAL LOW (ref 13.0–17.0)
Immature Granulocytes: 0 %
Lymphocytes Relative: 32 %
Lymphs Abs: 1.2 10*3/uL (ref 0.7–4.0)
MCH: 30.5 pg (ref 26.0–34.0)
MCHC: 32.8 g/dL (ref 30.0–36.0)
MCV: 93 fL (ref 80.0–100.0)
Monocytes Absolute: 0.4 10*3/uL (ref 0.1–1.0)
Monocytes Relative: 10 %
Neutro Abs: 1.9 10*3/uL (ref 1.7–7.7)
Neutrophils Relative %: 50 %
Platelets: 155 10*3/uL (ref 150–400)
RBC: 3.57 MIL/uL — ABNORMAL LOW (ref 4.22–5.81)
RDW: 14.9 % (ref 11.5–15.5)
WBC: 3.8 10*3/uL — ABNORMAL LOW (ref 4.0–10.5)
nRBC: 0 % (ref 0.0–0.2)

## 2020-02-06 LAB — FERRITIN: Ferritin: 115 ng/mL (ref 24–336)

## 2020-02-06 LAB — IRON AND TIBC
Iron: 67 ug/dL (ref 45–182)
Saturation Ratios: 21 % (ref 17.9–39.5)
TIBC: 321 ug/dL (ref 250–450)
UIBC: 254 ug/dL

## 2020-02-06 LAB — POCT HEMOGLOBIN-HEMACUE: Hemoglobin: 10.9 g/dL — ABNORMAL LOW (ref 13.0–17.0)

## 2020-02-06 MED ORDER — EPOETIN ALFA-EPBX 10000 UNIT/ML IJ SOLN
10000.0000 [IU] | INTRAMUSCULAR | Status: DC
  Administered 2020-02-06: 10000 [IU] via SUBCUTANEOUS

## 2020-02-06 MED ORDER — EPOETIN ALFA-EPBX 10000 UNIT/ML IJ SOLN
INTRAMUSCULAR | Status: AC
  Filled 2020-02-06: qty 1

## 2020-02-20 ENCOUNTER — Encounter (HOSPITAL_COMMUNITY)
Admission: RE | Admit: 2020-02-20 | Discharge: 2020-02-20 | Disposition: A | Discharge status: Home / Self Care | Payer: Medicare Other | Source: Ambulatory Visit | Attending: Nephrology | Admitting: Nephrology

## 2020-02-20 ENCOUNTER — Other Ambulatory Visit: Payer: Self-pay

## 2020-02-20 DIAGNOSIS — N184 Chronic kidney disease, stage 4 (severe): Secondary | ICD-10-CM | POA: Diagnosis not present

## 2020-02-20 LAB — POCT HEMOGLOBIN-HEMACUE: Hemoglobin: 11 g/dL — ABNORMAL LOW (ref 13.0–17.0)

## 2020-02-20 MED ORDER — EPOETIN ALFA-EPBX 10000 UNIT/ML IJ SOLN
INTRAMUSCULAR | Status: AC
  Filled 2020-02-20: qty 1

## 2020-02-20 MED ORDER — EPOETIN ALFA-EPBX 10000 UNIT/ML IJ SOLN
10000.0000 [IU] | INTRAMUSCULAR | Status: DC
  Administered 2020-02-20: 10000 [IU] via SUBCUTANEOUS

## 2020-03-04 ENCOUNTER — Encounter (HOSPITAL_COMMUNITY): Payer: Medicare Other

## 2020-03-05 ENCOUNTER — Encounter (HOSPITAL_COMMUNITY): Payer: Medicare Other

## 2020-03-08 ENCOUNTER — Other Ambulatory Visit (HOSPITAL_COMMUNITY): Payer: Self-pay | Admitting: *Deleted

## 2020-03-11 ENCOUNTER — Other Ambulatory Visit: Payer: Self-pay

## 2020-03-11 ENCOUNTER — Encounter (HOSPITAL_COMMUNITY)
Admission: RE | Admit: 2020-03-11 | Discharge: 2020-03-11 | Disposition: A | Discharge status: Home / Self Care | Payer: Medicare Other | Source: Ambulatory Visit | Attending: Nephrology | Admitting: Nephrology

## 2020-03-11 DIAGNOSIS — D631 Anemia in chronic kidney disease: Secondary | ICD-10-CM | POA: Insufficient documentation

## 2020-03-11 DIAGNOSIS — N184 Chronic kidney disease, stage 4 (severe): Secondary | ICD-10-CM | POA: Insufficient documentation

## 2020-03-11 LAB — CBC WITH DIFFERENTIAL/PLATELET
Abs Immature Granulocytes: 0 10*3/uL (ref 0.00–0.07)
Basophils Absolute: 0 10*3/uL (ref 0.0–0.1)
Basophils Relative: 1 %
Eosinophils Absolute: 0.2 10*3/uL (ref 0.0–0.5)
Eosinophils Relative: 6 %
HCT: 33 % — ABNORMAL LOW (ref 39.0–52.0)
Hemoglobin: 10.7 g/dL — ABNORMAL LOW (ref 13.0–17.0)
Immature Granulocytes: 0 %
Lymphocytes Relative: 31 %
Lymphs Abs: 1.1 10*3/uL (ref 0.7–4.0)
MCH: 30.1 pg (ref 26.0–34.0)
MCHC: 32.4 g/dL (ref 30.0–36.0)
MCV: 93 fL (ref 80.0–100.0)
Monocytes Absolute: 0.4 10*3/uL (ref 0.1–1.0)
Monocytes Relative: 11 %
Neutro Abs: 1.9 10*3/uL (ref 1.7–7.7)
Neutrophils Relative %: 51 %
Platelets: 137 10*3/uL — ABNORMAL LOW (ref 150–400)
RBC: 3.55 MIL/uL — ABNORMAL LOW (ref 4.22–5.81)
RDW: 14.3 % (ref 11.5–15.5)
WBC: 3.7 10*3/uL — ABNORMAL LOW (ref 4.0–10.5)
nRBC: 0 % (ref 0.0–0.2)

## 2020-03-11 LAB — FERRITIN: Ferritin: 117 ng/mL (ref 24–336)

## 2020-03-11 LAB — IRON AND TIBC
Iron: 71 ug/dL (ref 45–182)
Saturation Ratios: 22 % (ref 17.9–39.5)
TIBC: 316 ug/dL (ref 250–450)
UIBC: 245 ug/dL

## 2020-03-11 LAB — POCT HEMOGLOBIN-HEMACUE: Hemoglobin: 11.1 g/dL — ABNORMAL LOW (ref 13.0–17.0)

## 2020-03-11 MED ORDER — EPOETIN ALFA-EPBX 10000 UNIT/ML IJ SOLN
INTRAMUSCULAR | Status: AC
  Administered 2020-03-11: 09:00:00 10000 [IU] via SUBCUTANEOUS
  Filled 2020-03-11: qty 1

## 2020-03-11 MED ORDER — EPOETIN ALFA-EPBX 10000 UNIT/ML IJ SOLN: 10000.0000 [IU] | Freq: Once | INTRAMUSCULAR | Status: DC

## 2020-03-25 ENCOUNTER — Encounter (HOSPITAL_COMMUNITY)
Admission: RE | Admit: 2020-03-25 | Discharge: 2020-03-25 | Disposition: A | Discharge status: Home / Self Care | Payer: Medicare Other | Source: Ambulatory Visit | Attending: Nephrology | Admitting: Nephrology

## 2020-03-25 DIAGNOSIS — N184 Chronic kidney disease, stage 4 (severe): Secondary | ICD-10-CM | POA: Diagnosis not present

## 2020-03-25 LAB — POCT HEMOGLOBIN-HEMACUE: Hemoglobin: 11.1 g/dL — ABNORMAL LOW (ref 13.0–17.0)

## 2020-03-25 MED ORDER — EPOETIN ALFA-EPBX 10000 UNIT/ML IJ SOLN
10000.0000 [IU] | Freq: Once | INTRAMUSCULAR | Status: DC
  Administered 2020-03-25: 09:00:00 10000 [IU] via SUBCUTANEOUS

## 2020-03-25 MED ORDER — EPOETIN ALFA-EPBX 10000 UNIT/ML IJ SOLN
INTRAMUSCULAR | Status: AC
  Filled 2020-03-25: qty 1

## 2020-04-04 ENCOUNTER — Encounter (HOSPITAL_COMMUNITY)
Admission: RE | Admit: 2020-04-04 | Discharge: 2020-04-04 | Disposition: A | Discharge status: Home / Self Care | Payer: Medicare Other | Source: Ambulatory Visit | Attending: Nephrology | Admitting: Nephrology

## 2020-04-04 ENCOUNTER — Other Ambulatory Visit: Payer: Self-pay

## 2020-04-04 DIAGNOSIS — N184 Chronic kidney disease, stage 4 (severe): Secondary | ICD-10-CM | POA: Insufficient documentation

## 2020-04-04 DIAGNOSIS — D631 Anemia in chronic kidney disease: Secondary | ICD-10-CM | POA: Insufficient documentation

## 2020-04-04 LAB — CBC WITH DIFFERENTIAL/PLATELET
Abs Immature Granulocytes: 0.01 10*3/uL (ref 0.00–0.07)
Basophils Absolute: 0 10*3/uL (ref 0.0–0.1)
Basophils Relative: 1 %
Eosinophils Absolute: 0.2 10*3/uL (ref 0.0–0.5)
Eosinophils Relative: 5 %
HCT: 32.3 % — ABNORMAL LOW (ref 39.0–52.0)
Hemoglobin: 10.9 g/dL — ABNORMAL LOW (ref 13.0–17.0)
Immature Granulocytes: 0 %
Lymphocytes Relative: 24 %
Lymphs Abs: 0.9 10*3/uL (ref 0.7–4.0)
MCH: 31.5 pg (ref 26.0–34.0)
MCHC: 33.7 g/dL (ref 30.0–36.0)
MCV: 93.4 fL (ref 80.0–100.0)
Monocytes Absolute: 0.4 10*3/uL (ref 0.1–1.0)
Monocytes Relative: 10 %
Neutro Abs: 2.3 10*3/uL (ref 1.7–7.7)
Neutrophils Relative %: 60 %
Platelets: 155 10*3/uL (ref 150–400)
RBC: 3.46 MIL/uL — ABNORMAL LOW (ref 4.22–5.81)
RDW: 14.8 % (ref 11.5–15.5)
WBC: 3.9 10*3/uL — ABNORMAL LOW (ref 4.0–10.5)
nRBC: 0 % (ref 0.0–0.2)

## 2020-04-04 LAB — POCT HEMOGLOBIN-HEMACUE: Hemoglobin: 11.1 g/dL — ABNORMAL LOW (ref 13.0–17.0)

## 2020-04-04 LAB — FERRITIN: Ferritin: 94 ng/mL (ref 24–336)

## 2020-04-04 LAB — IRON AND TIBC
Iron: 40 ug/dL — ABNORMAL LOW (ref 45–182)
Saturation Ratios: 13 % — ABNORMAL LOW (ref 17.9–39.5)
TIBC: 304 ug/dL (ref 250–450)
UIBC: 264 ug/dL

## 2020-04-04 MED ORDER — EPOETIN ALFA-EPBX 10000 UNIT/ML IJ SOLN
10000.0000 [IU] | Freq: Once | INTRAMUSCULAR | Status: DC
  Administered 2020-04-04: 10000 [IU] via SUBCUTANEOUS

## 2020-04-04 MED ORDER — EPOETIN ALFA-EPBX 10000 UNIT/ML IJ SOLN
INTRAMUSCULAR | Status: AC
  Filled 2020-04-04: qty 1

## 2020-04-16 ENCOUNTER — Encounter (HOSPITAL_COMMUNITY): Payer: Medicare Other

## 2020-04-18 ENCOUNTER — Encounter (HOSPITAL_COMMUNITY): Payer: Medicare Other

## 2020-04-23 ENCOUNTER — Other Ambulatory Visit: Payer: Self-pay

## 2020-04-23 ENCOUNTER — Other Ambulatory Visit (HOSPITAL_COMMUNITY): Payer: Self-pay

## 2020-04-23 ENCOUNTER — Encounter: Payer: Self-pay | Admitting: Nephrology

## 2020-04-23 ENCOUNTER — Encounter (HOSPITAL_COMMUNITY)
Admission: RE | Admit: 2020-04-23 | Discharge: 2020-04-23 | Disposition: A | Discharge status: Home / Self Care | Payer: Medicare Other | Source: Ambulatory Visit | Attending: Nephrology | Admitting: Nephrology

## 2020-04-23 DIAGNOSIS — D631 Anemia in chronic kidney disease: Secondary | ICD-10-CM | POA: Insufficient documentation

## 2020-04-23 DIAGNOSIS — N184 Chronic kidney disease, stage 4 (severe): Secondary | ICD-10-CM | POA: Diagnosis not present

## 2020-04-23 DIAGNOSIS — N189 Chronic kidney disease, unspecified: Secondary | ICD-10-CM | POA: Insufficient documentation

## 2020-04-23 LAB — POCT HEMOGLOBIN-HEMACUE: Hemoglobin: 11.3 g/dL — ABNORMAL LOW (ref 13.0–17.0)

## 2020-04-23 MED ORDER — EPOETIN ALFA-EPBX 10000 UNIT/ML IJ SOLN
10000.0000 [IU] | Freq: Once | INTRAMUSCULAR | Status: AC
  Administered 2020-04-23: 10000 [IU] via SUBCUTANEOUS

## 2020-04-23 MED ORDER — EPOETIN ALFA-EPBX 10000 UNIT/ML IJ SOLN
INTRAMUSCULAR | Status: AC
  Filled 2020-04-23: qty 1

## 2020-05-07 ENCOUNTER — Encounter (HOSPITAL_COMMUNITY): Payer: Medicare Other

## 2020-05-07 ENCOUNTER — Inpatient Hospital Stay (HOSPITAL_COMMUNITY): Admission: RE | Admit: 2020-05-07 | Payer: Medicare Other | Source: Ambulatory Visit

## 2020-05-15 ENCOUNTER — Encounter (HOSPITAL_COMMUNITY)
Admission: RE | Admit: 2020-05-15 | Discharge: 2020-05-15 | Disposition: A | Discharge status: Home / Self Care | Payer: Medicare Other | Source: Ambulatory Visit | Attending: Nephrology | Admitting: Nephrology

## 2020-05-15 ENCOUNTER — Other Ambulatory Visit: Payer: Self-pay

## 2020-05-15 VITALS — BP 155/63 | HR 51 | Temp 97.5°F | Resp 18

## 2020-05-15 DIAGNOSIS — N184 Chronic kidney disease, stage 4 (severe): Secondary | ICD-10-CM | POA: Diagnosis present

## 2020-05-15 DIAGNOSIS — D631 Anemia in chronic kidney disease: Secondary | ICD-10-CM | POA: Diagnosis present

## 2020-05-15 LAB — POCT HEMOGLOBIN-HEMACUE: Hemoglobin: 10.8 g/dL — ABNORMAL LOW (ref 13.0–17.0)

## 2020-05-15 MED ORDER — SODIUM CHLORIDE 0.9 % IV SOLN
510.0000 mg | INTRAVENOUS | Status: DC
  Administered 2020-05-15: 510 mg via INTRAVENOUS
  Filled 2020-05-15: qty 17

## 2020-05-15 MED ORDER — EPOETIN ALFA-EPBX 10000 UNIT/ML IJ SOLN: 10000.0000 [IU] | INTRAMUSCULAR | Status: DC

## 2020-05-15 MED ORDER — EPOETIN ALFA-EPBX 10000 UNIT/ML IJ SOLN
INTRAMUSCULAR | Status: AC
  Administered 2020-05-15: 10000 [IU] via SUBCUTANEOUS
  Filled 2020-05-15: qty 1

## 2020-05-15 NOTE — Discharge Instructions (Signed)
Ferumoxytol injection What is this medicine? FERUMOXYTOL is an iron complex. Iron is used to make healthy red blood cells, which carry oxygen and nutrients throughout the body. This medicine is used to treat iron deficiency anemia. This medicine may be used for other purposes; ask your health care provider or pharmacist if you have questions. COMMON BRAND NAME(S): Feraheme What should I tell my health care provider before I take this medicine? They need to know if you have any of these conditions:  anemia not caused by low iron levels  high levels of iron in the blood  magnetic resonance imaging (MRI) test scheduled  an unusual or allergic reaction to iron, other medicines, foods, dyes, or preservatives  pregnant or trying to get pregnant  breast-feeding How should I use this medicine? This medicine is for injection into a vein. It is given by a health care professional in a hospital or clinic setting. Talk to your pediatrician regarding the use of this medicine in children. Special care may be needed. Overdosage: If you think you have taken too much of this medicine contact a poison control center or emergency room at once. NOTE: This medicine is only for you. Do not share this medicine with others. What if I miss a dose? It is important not to miss your dose. Call your doctor or health care professional if you are unable to keep an appointment. What may interact with this medicine? This medicine may interact with the following medications:  other iron products This list may not describe all possible interactions. Give your health care provider a list of all the medicines, herbs, non-prescription drugs, or dietary supplements you use. Also tell them if you smoke, drink alcohol, or use illegal drugs. Some items may interact with your medicine. What should I watch for while using this medicine? Visit your doctor or healthcare professional regularly. Tell your doctor or healthcare  professional if your symptoms do not start to get better or if they get worse. You may need blood work done while you are taking this medicine. You may need to follow a special diet. Talk to your doctor. Foods that contain iron include: whole grains/cereals, dried fruits, beans, or peas, leafy green vegetables, and organ meats (liver, kidney). What side effects may I notice from receiving this medicine? Side effects that you should report to your doctor or health care professional as soon as possible:  allergic reactions like skin rash, itching or hives, swelling of the face, lips, or tongue  breathing problems  changes in blood pressure  feeling faint or lightheaded, falls  fever or chills  flushing, sweating, or hot feelings  swelling of the ankles or feet Side effects that usually do not require medical attention (report to your doctor or health care professional if they continue or are bothersome):  diarrhea  headache  nausea, vomiting  stomach pain This list may not describe all possible side effects. Call your doctor for medical advice about side effects. You may report side effects to FDA at 1-800-FDA-1088. Where should I keep my medicine? This drug is given in a hospital or clinic and will not be stored at home. NOTE: This sheet is a summary. It may not cover all possible information. If you have questions about this medicine, talk to your doctor, pharmacist, or health care provider.  2021 Elsevier/Gold Standard (2016-05-04 20:21:10)  

## 2020-05-21 ENCOUNTER — Encounter (HOSPITAL_COMMUNITY): Payer: Medicare Other

## 2020-05-29 ENCOUNTER — Other Ambulatory Visit: Payer: Self-pay

## 2020-05-29 ENCOUNTER — Encounter (HOSPITAL_COMMUNITY)
Admission: RE | Admit: 2020-05-29 | Discharge: 2020-05-29 | Disposition: A | Discharge status: Home / Self Care | Payer: Medicare Other | Source: Ambulatory Visit | Attending: Nephrology | Admitting: Nephrology

## 2020-05-29 VITALS — BP 161/65 | HR 62 | Temp 97.3°F | Resp 18

## 2020-05-29 DIAGNOSIS — N184 Chronic kidney disease, stage 4 (severe): Secondary | ICD-10-CM | POA: Diagnosis present

## 2020-05-29 DIAGNOSIS — D631 Anemia in chronic kidney disease: Secondary | ICD-10-CM | POA: Insufficient documentation

## 2020-05-29 LAB — CBC WITH DIFFERENTIAL/PLATELET
Abs Immature Granulocytes: 0.02 10*3/uL (ref 0.00–0.07)
Basophils Absolute: 0 10*3/uL (ref 0.0–0.1)
Basophils Relative: 0 %
Eosinophils Absolute: 0.2 10*3/uL (ref 0.0–0.5)
Eosinophils Relative: 4 %
HCT: 30.3 % — ABNORMAL LOW (ref 39.0–52.0)
Hemoglobin: 10.2 g/dL — ABNORMAL LOW (ref 13.0–17.0)
Immature Granulocytes: 0 %
Lymphocytes Relative: 26 %
Lymphs Abs: 1.3 10*3/uL (ref 0.7–4.0)
MCH: 31.3 pg (ref 26.0–34.0)
MCHC: 33.7 g/dL (ref 30.0–36.0)
MCV: 92.9 fL (ref 80.0–100.0)
Monocytes Absolute: 0.6 10*3/uL (ref 0.1–1.0)
Monocytes Relative: 12 %
Neutro Abs: 2.9 10*3/uL (ref 1.7–7.7)
Neutrophils Relative %: 58 %
Platelets: 156 10*3/uL (ref 150–400)
RBC: 3.26 MIL/uL — ABNORMAL LOW (ref 4.22–5.81)
RDW: 13.9 % (ref 11.5–15.5)
WBC: 5 10*3/uL (ref 4.0–10.5)
nRBC: 0 % (ref 0.0–0.2)

## 2020-05-29 LAB — POCT HEMOGLOBIN-HEMACUE: Hemoglobin: 9.7 g/dL — ABNORMAL LOW (ref 13.0–17.0)

## 2020-05-29 MED ORDER — SODIUM CHLORIDE 0.9 % IV SOLN
510.0000 mg | INTRAVENOUS | Status: DC
  Administered 2020-05-29: 510 mg via INTRAVENOUS
  Filled 2020-05-29: qty 510

## 2020-05-29 MED ORDER — EPOETIN ALFA-EPBX 10000 UNIT/ML IJ SOLN
INTRAMUSCULAR | Status: AC
  Filled 2020-05-29: qty 1

## 2020-05-29 MED ORDER — EPOETIN ALFA-EPBX 10000 UNIT/ML IJ SOLN
10000.0000 [IU] | INTRAMUSCULAR | Status: DC
  Administered 2020-05-29: 10000 [IU] via SUBCUTANEOUS

## 2020-06-12 ENCOUNTER — Encounter (HOSPITAL_COMMUNITY)
Admission: RE | Admit: 2020-06-12 | Discharge: 2020-06-12 | Disposition: A | Discharge status: Home / Self Care | Payer: Medicare Other | Source: Ambulatory Visit | Attending: Nephrology | Admitting: Nephrology

## 2020-06-12 ENCOUNTER — Other Ambulatory Visit: Payer: Self-pay

## 2020-06-12 VITALS — BP 135/73 | HR 67 | Temp 97.4°F | Resp 18

## 2020-06-12 DIAGNOSIS — D631 Anemia in chronic kidney disease: Secondary | ICD-10-CM

## 2020-06-12 DIAGNOSIS — N184 Chronic kidney disease, stage 4 (severe): Secondary | ICD-10-CM | POA: Diagnosis not present

## 2020-06-12 LAB — IRON AND TIBC
Iron: 84 ug/dL (ref 45–182)
Saturation Ratios: 30 % (ref 17.9–39.5)
TIBC: 280 ug/dL (ref 250–450)
UIBC: 196 ug/dL

## 2020-06-12 LAB — FERRITIN: Ferritin: 557 ng/mL — ABNORMAL HIGH (ref 24–336)

## 2020-06-12 LAB — POCT HEMOGLOBIN-HEMACUE: Hemoglobin: 10.1 g/dL — ABNORMAL LOW (ref 13.0–17.0)

## 2020-06-12 MED ORDER — EPOETIN ALFA-EPBX 10000 UNIT/ML IJ SOLN
10000.0000 [IU] | INTRAMUSCULAR | Status: DC
  Administered 2020-06-12: 10000 [IU] via SUBCUTANEOUS

## 2020-06-12 MED ORDER — EPOETIN ALFA-EPBX 10000 UNIT/ML IJ SOLN
INTRAMUSCULAR | Status: AC
  Filled 2020-06-12: qty 1

## 2020-06-26 ENCOUNTER — Encounter (HOSPITAL_COMMUNITY): Payer: Medicare Other

## 2020-07-03 ENCOUNTER — Encounter (HOSPITAL_COMMUNITY)
Admission: RE | Admit: 2020-07-03 | Discharge: 2020-07-03 | Disposition: A | Discharge status: Home / Self Care | Payer: Medicare Other | Source: Ambulatory Visit | Attending: Nephrology | Admitting: Nephrology

## 2020-07-03 ENCOUNTER — Other Ambulatory Visit: Payer: Self-pay

## 2020-07-03 VITALS — BP 162/81 | HR 66 | Temp 97.4°F | Resp 18

## 2020-07-03 DIAGNOSIS — D631 Anemia in chronic kidney disease: Secondary | ICD-10-CM | POA: Diagnosis present

## 2020-07-03 DIAGNOSIS — N184 Chronic kidney disease, stage 4 (severe): Secondary | ICD-10-CM | POA: Insufficient documentation

## 2020-07-03 LAB — POCT HEMOGLOBIN-HEMACUE: Hemoglobin: 10.7 g/dL — ABNORMAL LOW (ref 13.0–17.0)

## 2020-07-03 MED ORDER — EPOETIN ALFA-EPBX 10000 UNIT/ML IJ SOLN
10000.0000 [IU] | INTRAMUSCULAR | Status: DC
  Administered 2020-07-03: 10000 [IU] via SUBCUTANEOUS

## 2020-07-03 MED ORDER — EPOETIN ALFA-EPBX 10000 UNIT/ML IJ SOLN
INTRAMUSCULAR | Status: AC
  Filled 2020-07-03: qty 1

## 2020-07-10 ENCOUNTER — Encounter (HOSPITAL_COMMUNITY): Payer: Medicare Other

## 2020-07-16 ENCOUNTER — Encounter (HOSPITAL_COMMUNITY)
Admission: RE | Admit: 2020-07-16 | Discharge: 2020-07-16 | Disposition: A | Discharge status: Home / Self Care | Payer: Medicare Other | Source: Ambulatory Visit | Attending: Nephrology | Admitting: Nephrology

## 2020-07-16 ENCOUNTER — Other Ambulatory Visit: Payer: Self-pay

## 2020-07-16 VITALS — BP 139/60 | HR 59 | Temp 97.3°F | Resp 18

## 2020-07-16 DIAGNOSIS — D631 Anemia in chronic kidney disease: Secondary | ICD-10-CM

## 2020-07-16 DIAGNOSIS — N184 Chronic kidney disease, stage 4 (severe): Secondary | ICD-10-CM | POA: Diagnosis not present

## 2020-07-16 LAB — CBC WITH DIFFERENTIAL/PLATELET
Abs Immature Granulocytes: 0.01 10*3/uL (ref 0.00–0.07)
Basophils Absolute: 0 10*3/uL (ref 0.0–0.1)
Basophils Relative: 1 %
Eosinophils Absolute: 0.1 10*3/uL (ref 0.0–0.5)
Eosinophils Relative: 4 %
HCT: 33 % — ABNORMAL LOW (ref 39.0–52.0)
Hemoglobin: 10.7 g/dL — ABNORMAL LOW (ref 13.0–17.0)
Immature Granulocytes: 0 %
Lymphocytes Relative: 26 %
Lymphs Abs: 0.9 10*3/uL (ref 0.7–4.0)
MCH: 30.7 pg (ref 26.0–34.0)
MCHC: 32.4 g/dL (ref 30.0–36.0)
MCV: 94.6 fL (ref 80.0–100.0)
Monocytes Absolute: 0.4 10*3/uL (ref 0.1–1.0)
Monocytes Relative: 12 %
Neutro Abs: 2 10*3/uL (ref 1.7–7.7)
Neutrophils Relative %: 57 %
Platelets: 156 10*3/uL (ref 150–400)
RBC: 3.49 MIL/uL — ABNORMAL LOW (ref 4.22–5.81)
RDW: 15.2 % (ref 11.5–15.5)
WBC: 3.4 10*3/uL — ABNORMAL LOW (ref 4.0–10.5)
nRBC: 0 % (ref 0.0–0.2)

## 2020-07-16 LAB — POCT HEMOGLOBIN-HEMACUE: Hemoglobin: 10.9 g/dL — ABNORMAL LOW (ref 13.0–17.0)

## 2020-07-16 LAB — IRON AND TIBC
Iron: 85 ug/dL (ref 45–182)
Saturation Ratios: 31 % (ref 17.9–39.5)
TIBC: 270 ug/dL (ref 250–450)
UIBC: 185 ug/dL

## 2020-07-16 LAB — FERRITIN: Ferritin: 375 ng/mL — ABNORMAL HIGH (ref 24–336)

## 2020-07-16 MED ORDER — EPOETIN ALFA-EPBX 10000 UNIT/ML IJ SOLN
INTRAMUSCULAR | Status: AC
  Administered 2020-07-16: 10000 [IU] via SUBCUTANEOUS
  Filled 2020-07-16: qty 1

## 2020-07-16 MED ORDER — EPOETIN ALFA-EPBX 10000 UNIT/ML IJ SOLN: 10000.0000 [IU] | INTRAMUSCULAR | Status: DC

## 2020-07-17 ENCOUNTER — Encounter (HOSPITAL_COMMUNITY): Payer: Medicare Other

## 2020-07-30 ENCOUNTER — Ambulatory Visit (HOSPITAL_COMMUNITY)
Admission: RE | Admit: 2020-07-30 | Discharge: 2020-07-30 | Disposition: A | Discharge status: Home / Self Care | Payer: Medicare Other | Source: Ambulatory Visit | Attending: Nephrology | Admitting: Nephrology

## 2020-07-30 ENCOUNTER — Other Ambulatory Visit: Payer: Self-pay

## 2020-07-30 VITALS — BP 178/72 | HR 59

## 2020-07-30 DIAGNOSIS — D631 Anemia in chronic kidney disease: Secondary | ICD-10-CM | POA: Diagnosis present

## 2020-07-30 DIAGNOSIS — N184 Chronic kidney disease, stage 4 (severe): Secondary | ICD-10-CM | POA: Insufficient documentation

## 2020-07-30 LAB — POCT HEMOGLOBIN-HEMACUE: Hemoglobin: 10.4 g/dL — ABNORMAL LOW (ref 13.0–17.0)

## 2020-07-30 MED ORDER — EPOETIN ALFA-EPBX 10000 UNIT/ML IJ SOLN
10000.0000 [IU] | INTRAMUSCULAR | Status: DC
  Administered 2020-07-30: 10000 [IU] via SUBCUTANEOUS

## 2020-07-30 MED ORDER — EPOETIN ALFA-EPBX 10000 UNIT/ML IJ SOLN
INTRAMUSCULAR | Status: AC
  Filled 2020-07-30: qty 1

## 2020-08-13 ENCOUNTER — Other Ambulatory Visit: Payer: Self-pay

## 2020-08-13 ENCOUNTER — Encounter (HOSPITAL_COMMUNITY)
Admission: RE | Admit: 2020-08-13 | Discharge: 2020-08-13 | Disposition: A | Discharge status: Home / Self Care | Payer: Medicare Other | Source: Ambulatory Visit | Attending: Nephrology | Admitting: Nephrology

## 2020-08-13 VITALS — BP 158/69 | HR 64 | Temp 97.6°F

## 2020-08-13 DIAGNOSIS — D631 Anemia in chronic kidney disease: Secondary | ICD-10-CM | POA: Insufficient documentation

## 2020-08-13 DIAGNOSIS — N184 Chronic kidney disease, stage 4 (severe): Secondary | ICD-10-CM | POA: Diagnosis not present

## 2020-08-13 LAB — CBC WITH DIFFERENTIAL/PLATELET
Abs Immature Granulocytes: 0.01 10*3/uL (ref 0.00–0.07)
Basophils Absolute: 0 10*3/uL (ref 0.0–0.1)
Basophils Relative: 1 %
Eosinophils Absolute: 0.1 10*3/uL (ref 0.0–0.5)
Eosinophils Relative: 2 %
HCT: 32.7 % — ABNORMAL LOW (ref 39.0–52.0)
Hemoglobin: 10.7 g/dL — ABNORMAL LOW (ref 13.0–17.0)
Immature Granulocytes: 0 %
Lymphocytes Relative: 21 %
Lymphs Abs: 1 10*3/uL (ref 0.7–4.0)
MCH: 31.2 pg (ref 26.0–34.0)
MCHC: 32.7 g/dL (ref 30.0–36.0)
MCV: 95.3 fL (ref 80.0–100.0)
Monocytes Absolute: 0.4 10*3/uL (ref 0.1–1.0)
Monocytes Relative: 10 %
Neutro Abs: 3.1 10*3/uL (ref 1.7–7.7)
Neutrophils Relative %: 66 %
Platelets: 145 10*3/uL — ABNORMAL LOW (ref 150–400)
RBC: 3.43 MIL/uL — ABNORMAL LOW (ref 4.22–5.81)
RDW: 14.7 % (ref 11.5–15.5)
WBC: 4.6 10*3/uL (ref 4.0–10.5)
nRBC: 0 % (ref 0.0–0.2)

## 2020-08-13 LAB — IRON AND TIBC
Iron: 74 ug/dL (ref 45–182)
Saturation Ratios: 27 % (ref 17.9–39.5)
TIBC: 277 ug/dL (ref 250–450)
UIBC: 203 ug/dL

## 2020-08-13 LAB — FERRITIN: Ferritin: 324 ng/mL (ref 24–336)

## 2020-08-13 MED ORDER — EPOETIN ALFA-EPBX 10000 UNIT/ML IJ SOLN
10000.0000 [IU] | INTRAMUSCULAR | Status: DC
  Administered 2020-08-13: 10000 [IU] via SUBCUTANEOUS

## 2020-08-13 MED ORDER — EPOETIN ALFA-EPBX 10000 UNIT/ML IJ SOLN
INTRAMUSCULAR | Status: AC
  Filled 2020-08-13: qty 1

## 2020-08-14 LAB — POCT HEMOGLOBIN-HEMACUE: Hemoglobin: 10.9 g/dL — ABNORMAL LOW (ref 13.0–17.0)

## 2020-08-28 ENCOUNTER — Other Ambulatory Visit: Payer: Self-pay

## 2020-08-28 ENCOUNTER — Encounter (HOSPITAL_COMMUNITY)
Admission: RE | Admit: 2020-08-28 | Discharge: 2020-08-28 | Disposition: A | Discharge status: Home / Self Care | Payer: Medicare (Managed Care) | Source: Ambulatory Visit | Attending: Nephrology | Admitting: Nephrology

## 2020-08-28 VITALS — BP 165/73 | HR 66 | Temp 97.9°F | Resp 18

## 2020-08-28 DIAGNOSIS — D631 Anemia in chronic kidney disease: Secondary | ICD-10-CM | POA: Insufficient documentation

## 2020-08-28 DIAGNOSIS — N184 Chronic kidney disease, stage 4 (severe): Secondary | ICD-10-CM | POA: Diagnosis not present

## 2020-08-28 LAB — POCT HEMOGLOBIN-HEMACUE: Hemoglobin: 11.3 g/dL — ABNORMAL LOW (ref 13.0–17.0)

## 2020-08-28 MED ORDER — EPOETIN ALFA-EPBX 10000 UNIT/ML IJ SOLN
INTRAMUSCULAR | Status: AC
  Administered 2020-08-28: 10000 [IU] via SUBCUTANEOUS
  Filled 2020-08-28: qty 1

## 2020-08-28 MED ORDER — EPOETIN ALFA-EPBX 10000 UNIT/ML IJ SOLN: 10000.0000 [IU] | INTRAMUSCULAR | Status: DC

## 2020-09-04 ENCOUNTER — Encounter (HOSPITAL_COMMUNITY): Payer: Self-pay

## 2020-09-11 ENCOUNTER — Encounter (HOSPITAL_COMMUNITY): Payer: Medicare Other

## 2020-09-25 ENCOUNTER — Encounter (HOSPITAL_COMMUNITY): Payer: Medicare (Managed Care)

## 2020-10-15 ENCOUNTER — Encounter: Payer: Self-pay | Admitting: Neurology

## 2020-11-07 ENCOUNTER — Encounter (HOSPITAL_COMMUNITY): Payer: Self-pay

## 2020-11-14 ENCOUNTER — Encounter (HOSPITAL_COMMUNITY): Payer: Medicare (Managed Care)

## 2020-11-14 ENCOUNTER — Encounter (HOSPITAL_COMMUNITY)
Admission: RE | Admit: 2020-11-14 | Discharge: 2020-11-14 | Disposition: A | Discharge status: Home / Self Care | Payer: Medicare Other | Source: Ambulatory Visit | Attending: Nephrology | Admitting: Nephrology

## 2020-11-14 VITALS — BP 153/67 | HR 62 | Temp 97.7°F | Resp 20

## 2020-11-14 DIAGNOSIS — N184 Chronic kidney disease, stage 4 (severe): Secondary | ICD-10-CM | POA: Insufficient documentation

## 2020-11-14 DIAGNOSIS — D631 Anemia in chronic kidney disease: Secondary | ICD-10-CM | POA: Diagnosis present

## 2020-11-14 LAB — CBC WITH DIFFERENTIAL/PLATELET
Abs Immature Granulocytes: 0.01 10*3/uL (ref 0.00–0.07)
Basophils Absolute: 0 10*3/uL (ref 0.0–0.1)
Basophils Relative: 0 %
Eosinophils Absolute: 0.2 10*3/uL (ref 0.0–0.5)
Eosinophils Relative: 4 %
HCT: 27.9 % — ABNORMAL LOW (ref 39.0–52.0)
Hemoglobin: 9.3 g/dL — ABNORMAL LOW (ref 13.0–17.0)
Immature Granulocytes: 0 %
Lymphocytes Relative: 22 %
Lymphs Abs: 1 10*3/uL (ref 0.7–4.0)
MCH: 30.8 pg (ref 26.0–34.0)
MCHC: 33.3 g/dL (ref 30.0–36.0)
MCV: 92.4 fL (ref 80.0–100.0)
Monocytes Absolute: 0.6 10*3/uL (ref 0.1–1.0)
Monocytes Relative: 12 %
Neutro Abs: 2.8 10*3/uL (ref 1.7–7.7)
Neutrophils Relative %: 62 %
Platelets: 128 10*3/uL — ABNORMAL LOW (ref 150–400)
RBC: 3.02 MIL/uL — ABNORMAL LOW (ref 4.22–5.81)
RDW: 14.3 % (ref 11.5–15.5)
WBC: 4.6 10*3/uL (ref 4.0–10.5)
nRBC: 0 % (ref 0.0–0.2)

## 2020-11-14 LAB — FERRITIN: Ferritin: 306 ng/mL (ref 24–336)

## 2020-11-14 LAB — IRON AND TIBC
Iron: 71 ug/dL (ref 45–182)
Saturation Ratios: 24 % (ref 17.9–39.5)
TIBC: 294 ug/dL (ref 250–450)
UIBC: 223 ug/dL

## 2020-11-14 MED ORDER — EPOETIN ALFA-EPBX 10000 UNIT/ML IJ SOLN
INTRAMUSCULAR | Status: AC
  Filled 2020-11-14: qty 1

## 2020-11-14 MED ORDER — EPOETIN ALFA-EPBX 10000 UNIT/ML IJ SOLN
10000.0000 [IU] | INTRAMUSCULAR | Status: DC
  Administered 2020-11-14: 10000 [IU] via SUBCUTANEOUS

## 2020-11-15 LAB — POCT HEMOGLOBIN-HEMACUE: Hemoglobin: 9.1 g/dL — ABNORMAL LOW (ref 13.0–17.0)

## 2020-11-27 ENCOUNTER — Other Ambulatory Visit (HOSPITAL_COMMUNITY): Payer: Self-pay | Admitting: *Deleted

## 2020-11-28 ENCOUNTER — Encounter (HOSPITAL_COMMUNITY)
Admission: RE | Admit: 2020-11-28 | Discharge: 2020-11-28 | Disposition: A | Discharge status: Home / Self Care | Payer: Medicare Other | Source: Ambulatory Visit | Attending: Nephrology | Admitting: Nephrology

## 2020-11-28 VITALS — BP 157/71 | HR 55 | Temp 97.7°F | Resp 17

## 2020-11-28 DIAGNOSIS — N184 Chronic kidney disease, stage 4 (severe): Secondary | ICD-10-CM | POA: Diagnosis not present

## 2020-11-28 DIAGNOSIS — D631 Anemia in chronic kidney disease: Secondary | ICD-10-CM | POA: Insufficient documentation

## 2020-11-28 LAB — POCT HEMOGLOBIN-HEMACUE: Hemoglobin: 9.2 g/dL — ABNORMAL LOW (ref 13.0–17.0)

## 2020-11-28 MED ORDER — EPOETIN ALFA-EPBX 10000 UNIT/ML IJ SOLN
INTRAMUSCULAR | Status: AC
  Filled 2020-11-28: qty 1

## 2020-11-28 MED ORDER — EPOETIN ALFA-EPBX 10000 UNIT/ML IJ SOLN
10000.0000 [IU] | INTRAMUSCULAR | Status: DC
  Administered 2020-11-28: 10000 [IU] via SUBCUTANEOUS

## 2020-12-12 ENCOUNTER — Other Ambulatory Visit: Payer: Self-pay

## 2020-12-12 ENCOUNTER — Encounter (HOSPITAL_COMMUNITY)
Admission: RE | Admit: 2020-12-12 | Discharge: 2020-12-12 | Disposition: A | Discharge status: Home / Self Care | Payer: Medicare Other | Source: Ambulatory Visit | Attending: Nephrology | Admitting: Nephrology

## 2020-12-12 VITALS — BP 146/73 | HR 60 | Temp 98.0°F | Resp 18

## 2020-12-12 DIAGNOSIS — D631 Anemia in chronic kidney disease: Secondary | ICD-10-CM

## 2020-12-12 DIAGNOSIS — N184 Chronic kidney disease, stage 4 (severe): Secondary | ICD-10-CM | POA: Diagnosis not present

## 2020-12-12 LAB — IRON AND TIBC
Iron: 70 ug/dL (ref 45–182)
Saturation Ratios: 23 % (ref 17.9–39.5)
TIBC: 302 ug/dL (ref 250–450)
UIBC: 232 ug/dL

## 2020-12-12 LAB — FERRITIN: Ferritin: 288 ng/mL (ref 24–336)

## 2020-12-12 LAB — POCT HEMOGLOBIN-HEMACUE: Hemoglobin: 10.9 g/dL — ABNORMAL LOW (ref 13.0–17.0)

## 2020-12-12 MED ORDER — EPOETIN ALFA-EPBX 10000 UNIT/ML IJ SOLN
INTRAMUSCULAR | Status: AC
  Administered 2020-12-12: 10000 [IU] via SUBCUTANEOUS
  Filled 2020-12-12: qty 1

## 2020-12-12 MED ORDER — EPOETIN ALFA-EPBX 10000 UNIT/ML IJ SOLN: 10000.0000 [IU] | INTRAMUSCULAR | Status: DC

## 2020-12-16 ENCOUNTER — Ambulatory Visit: Payer: Self-pay | Admitting: Surgery

## 2020-12-19 ENCOUNTER — Ambulatory Visit (INDEPENDENT_AMBULATORY_CARE_PROVIDER_SITE_OTHER): Payer: No Typology Code available for payment source | Admitting: Neurology

## 2020-12-19 ENCOUNTER — Encounter: Payer: Self-pay | Admitting: Neurology

## 2020-12-19 ENCOUNTER — Other Ambulatory Visit: Payer: Self-pay

## 2020-12-19 ENCOUNTER — Other Ambulatory Visit: Payer: No Typology Code available for payment source

## 2020-12-19 ENCOUNTER — Encounter (HOSPITAL_COMMUNITY): Payer: Self-pay

## 2020-12-19 VITALS — BP 178/50 | HR 62 | Ht 69.0 in | Wt 189.0 lb

## 2020-12-19 DIAGNOSIS — G621 Alcoholic polyneuropathy: Secondary | ICD-10-CM | POA: Diagnosis not present

## 2020-12-19 DIAGNOSIS — R292 Abnormal reflex: Secondary | ICD-10-CM

## 2020-12-19 DIAGNOSIS — IMO0002 Reserved for concepts with insufficient information to code with codable children: Secondary | ICD-10-CM

## 2020-12-19 DIAGNOSIS — E1165 Type 2 diabetes mellitus with hyperglycemia: Secondary | ICD-10-CM

## 2020-12-19 DIAGNOSIS — Z794 Long term (current) use of insulin: Secondary | ICD-10-CM

## 2020-12-19 DIAGNOSIS — M4807 Spinal stenosis, lumbosacral region: Secondary | ICD-10-CM

## 2020-12-19 DIAGNOSIS — M48061 Spinal stenosis, lumbar region without neurogenic claudication: Secondary | ICD-10-CM

## 2020-12-19 NOTE — Patient Instructions (Addendum)
MRI lumbar spine without contrast  Start physical therapy for balance training and low back strengthening  Check labs  Return to clinic in 4 months

## 2020-12-19 NOTE — Progress Notes (Signed)
[facility] Clinic Note - Initial Visit   Date: 12/19/20  Tyler Patterson MRN: [id number] DOB: [birth year]    Thank you for your kind referral of Tyler Patterson for consultation of gait imbalance. Although his history is well known to you, please allow [place] to reiterate it for the purpose of our medical record. The patient was accompanied to the clinic by self.    History of Present Illness: Tyler Patterson is a 70 y.o. right-handed male with tobacco use, diabetes mellitus, alcohol abuse, and GERD presenting for evaluation of gait instability.   Starting around 2018, he began noticing worsening balance when walking and veering side-to-side.  Symptoms are constant. He began using a cane about 5 years ago.  He has fallen twice this year and attributes this to his legs giving out. He endorses low back pain and leg cramps.  He is very sedentary and does not walk, so difficult to determine if he has exertional leg pain or heaviness.  He has numbness and tingling over the feet and lower legs for the past 10 years.  He takes Lyrica '150mg'$  twice daily.   He lives at home with wife in a 2 level home.  He drinks 10 beers over the weekend for the past year.  Prior to 2021, he was drinking 12-15 beers daily for the past 20 years.    Out-side paper records, electronic medical record, and images have been reviewed where available and summarized as:  Lab Results  Component Value Date   HGBA1C 13.5 (H) 08/27/2016   No results found for: [id number] Lab Results  Component Value Date   TSH 1.208 08/27/2016   No results found for: ESRSEDRATE, POCTSEDRATE  Past Medical History:  Diagnosis Date   Alcohol abuse    Diabetes mellitus    GERD (gastroesophageal reflux disease)    Headache 05/13/2013   Headache(784.0)    Hypertension    Seizures ([place])    Shortness of breath     Past Surgical History:  Procedure Laterality Date   HERNIA REPAIR       Medications:  Outpatient  Encounter Medications as of 12/19/2020  Medication Sig   acetaminophen (TYLENOL) 325 MG tablet Take 2 tablets (650 mg total) by mouth every 6 (six) hours as needed.   aspirin EC 81 MG tablet Take 81 mg by mouth daily. Swallow whole.   atorvastatin (LIPITOR) 40 MG tablet Take 20 mg by mouth daily.   carvedilol (COREG) 25 MG tablet Take 25 mg by mouth 2 (two) times daily with a meal.   Cholecalciferol (VITAMIN D) 50 MCG (2000 UT) tablet Take 2,000 Units by mouth daily.   furosemide (LASIX) 20 MG tablet Take 20 mg by mouth daily.   hydrALAZINE (APRESOLINE) 50 MG tablet Take 50 mg by mouth daily.   insulin aspart (NOVOLOG) 100 UNIT/ML injection Before each meal 3 times a day, 140-199 - 2 units, 200-250 - 4 units, 251-299 - 6 units,  300-349 - 8 units,  350 or above 10 units. Dispense syringes and needles as needed, Ok to switch to PEN if approved. Substitute to any brand approved. DX DM2, Code E11.65 (Patient taking differently: Inject 4 Units into the skin in the morning and at bedtime. Before each meal 3 times a day, 140-199 - 2 units, 200-250 - 4 units, 251-299 - 6 units,  300-349 - 8 units,  350 or above 10 units. Dispense syringes and needles as needed, Ok to switch to PEN if  approved. Substitute to any brand approved. DX DM2, Code E11.65)   insulin glargine (LANTUS) 100 UNIT/ML injection Inject 0.2 mLs (20 Units total) into the skin every evening. (Patient taking differently: Inject 27 Units into the skin every evening.)   losartan (COZAAR) 100 MG tablet Take 50 mg by mouth daily.   pregabalin (LYRICA) 150 MG capsule Take 150 mg by mouth 2 (two) times daily.   thiamine 100 MG tablet Take 1 tablet (100 mg total) by mouth daily.   No facility-administered encounter medications on file as of 12/19/2020.    Allergies:  Allergies  Allergen Reactions   Fiorinal [Butalbital-Aspirin-Caffeine] Swelling    Family History: Family History  Problem Relation Age of Onset   Lung cancer Mother    Lung  cancer Father    Diabetes Mellitus II Neg Hx     Social History: Social History   Tobacco Use   Smoking status: Some Days    Packs/day: 1.00    Types: Cigars, Cigarettes   Smokeless tobacco: Never   Tobacco comments:    smokes black & mild cigars on the weekends  Vaping Use   Vaping Use: Never used  Substance Use Topics   Alcohol use: Not Currently    Comment: drinks every day   Drug use: No   Social History   Social History Narrative   Lives in a two story home    Right Handed    Drinks no caffeine     Vital Signs:  BP (!) 178/50   Pulse 62   Ht '5\' 9"'$  (1.753 m)   Wt 189 lb (85.7 kg)   SpO2 100%   BMI 27.91 kg/m    Neurological Exam: MENTAL STATUS including orientation to time, place, person, recent and remote memory, attention span and concentration, language, and fund of knowledge is normal.  Speech is not dysarthric.  CRANIAL NERVES: II:  No visual field defects.    III-IV-VI: Pupils equal round and reactive to light.  Normal conjugate, extra-ocular eye movements in all directions of gaze.  No nystagmus.  No ptosis.   V:  Normal facial sensation.    VII:  Normal facial symmetry and movements.   VIII:  Normal hearing and vestibular function.   IX-X:  Normal palatal movement.   XI:  Normal shoulder shrug and head rotation.   XII:  Normal tongue strength and range of motion, no deviation or fasciculation.  MOTOR:  No atrophy, fasciculations or abnormal movements.  No pronator drift.   Upper Extremity:  Right  Left  Deltoid  5/5   5/5   Biceps  5/5   5/5   Triceps  5/5   5/5   Infraspinatus 5/5  5/5  Medial pectoralis 5/5  5/5  Wrist extensors  5/5   5/5   Wrist flexors  5/5   5/5   Finger extensors  5/5   5/5   Finger flexors  5/5   5/5   Dorsal interossei  5/5   5/5   Abductor pollicis  5/5   5/5   Tone (Ashworth scale)  0  0   Lower Extremity:  Right  Left  Hip flexors  5/5   5/5   Hip extensors  5/5   5/5   Adductor 5/5  5/5  Abductor 5/5  5/5   Knee flexors  5/5   5/5   Knee extensors  5/5   5/5   Dorsiflexors  5/5   5/5   Plantarflexors  5/5  5/5   Toe extensors  5/5   5/5   Toe flexors  5/5   5/5   Tone (Ashworth scale)  0  0   MSRs:  Right        Left                  brachioradialis 2+  2+  biceps 2+  2+  triceps 2+  2+  patellar 3+  3+  ankle jerk 0  0  Hoffman no  no  plantar response down  down   SENSORY:  Temperature, vibration, and pin prick reduced below the lower legs and into the feet.  Rhomberg sign is positive.   COORDINATION/GAIT: Normal finger-to- nose-finger.  Intact rapid alternating movements bilaterally.  Gait is mildly wide-based, assisted with cane.    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 Strongly encouraged him to abstain from alcohol and work with his PCP to work on getting his diabetes better controlled   Return to clinic in 4 months.    Thank you for allowing me to participate in patient's care.  If I can answer any additional questions, I would be pleased to do so.    Sincerely,    Joydan Gretzinger K. Posey Pronto, DO

## 2020-12-25 ENCOUNTER — Other Ambulatory Visit: Payer: No Typology Code available for payment source

## 2020-12-26 ENCOUNTER — Other Ambulatory Visit: Payer: Self-pay

## 2020-12-26 ENCOUNTER — Encounter (HOSPITAL_COMMUNITY)
Admission: RE | Admit: 2020-12-26 | Discharge: 2020-12-26 | Disposition: A | Discharge status: Home / Self Care | Payer: Medicare Other | Source: Ambulatory Visit | Attending: Nephrology | Admitting: Nephrology

## 2020-12-26 VITALS — BP 144/63 | HR 61 | Temp 97.7°F | Resp 18

## 2020-12-26 DIAGNOSIS — N184 Chronic kidney disease, stage 4 (severe): Secondary | ICD-10-CM | POA: Diagnosis not present

## 2020-12-26 DIAGNOSIS — D631 Anemia in chronic kidney disease: Secondary | ICD-10-CM

## 2020-12-26 LAB — POCT HEMOGLOBIN-HEMACUE: Hemoglobin: 10.9 g/dL — ABNORMAL LOW (ref 13.0–17.0)

## 2020-12-26 MED ORDER — EPOETIN ALFA-EPBX 10000 UNIT/ML IJ SOLN
INTRAMUSCULAR | Status: AC
  Filled 2020-12-26: qty 1

## 2020-12-26 MED ORDER — EPOETIN ALFA-EPBX 10000 UNIT/ML IJ SOLN
10000.0000 [IU] | INTRAMUSCULAR | Status: DC
  Administered 2020-12-26: 10000 [IU] via SUBCUTANEOUS

## 2020-12-31 ENCOUNTER — Other Ambulatory Visit: Payer: Self-pay

## 2020-12-31 ENCOUNTER — Other Ambulatory Visit: Payer: Medicare Other

## 2020-12-31 ENCOUNTER — Other Ambulatory Visit: Payer: No Typology Code available for payment source

## 2020-12-31 DIAGNOSIS — R292 Abnormal reflex: Secondary | ICD-10-CM

## 2020-12-31 DIAGNOSIS — M48061 Spinal stenosis, lumbar region without neurogenic claudication: Secondary | ICD-10-CM

## 2020-12-31 DIAGNOSIS — G621 Alcoholic polyneuropathy: Secondary | ICD-10-CM

## 2020-12-31 DIAGNOSIS — M4807 Spinal stenosis, lumbosacral region: Secondary | ICD-10-CM

## 2020-12-31 DIAGNOSIS — IMO0002 Reserved for concepts with insufficient information to code with codable children: Secondary | ICD-10-CM

## 2020-12-31 LAB — HEMOGLOBIN A1C: Hgb A1c MFr Bld: 8 % — ABNORMAL HIGH (ref 4.6–6.5)

## 2020-12-31 LAB — TSH: TSH: 0.8 u[IU]/mL (ref 0.35–5.50)

## 2021-01-04 ENCOUNTER — Other Ambulatory Visit: Payer: Self-pay | Admitting: Neurology

## 2021-01-04 DIAGNOSIS — IMO0002 Reserved for concepts with insufficient information to code with codable children: Secondary | ICD-10-CM

## 2021-01-04 DIAGNOSIS — G621 Alcoholic polyneuropathy: Secondary | ICD-10-CM

## 2021-01-04 DIAGNOSIS — M545 Low back pain, unspecified: Secondary | ICD-10-CM

## 2021-01-04 DIAGNOSIS — M48061 Spinal stenosis, lumbar region without neurogenic claudication: Secondary | ICD-10-CM

## 2021-01-04 DIAGNOSIS — M4807 Spinal stenosis, lumbosacral region: Secondary | ICD-10-CM

## 2021-01-04 DIAGNOSIS — R292 Abnormal reflex: Secondary | ICD-10-CM

## 2021-01-04 DIAGNOSIS — M4800 Spinal stenosis, site unspecified: Secondary | ICD-10-CM

## 2021-01-04 LAB — PROTEIN ELECTROPHORESIS, SERUM
Albumin ELP: 3.9 g/dL (ref 3.8–4.8)
Alpha 1: 0.3 g/dL (ref 0.2–0.3)
Alpha 2: 0.6 g/dL (ref 0.5–0.9)
Beta 2: 0.5 g/dL (ref 0.2–0.5)
Beta Globulin: 0.4 g/dL (ref 0.4–0.6)
Gamma Globulin: 1.1 g/dL (ref 0.8–1.7)
Total Protein: 6.8 g/dL (ref 6.1–8.1)

## 2021-01-04 LAB — VITAMIN B1: Vitamin B1 (Thiamine): 531 nmol/L — ABNORMAL HIGH (ref 8–30)

## 2021-01-07 ENCOUNTER — Other Ambulatory Visit: Payer: No Typology Code available for payment source

## 2021-01-08 ENCOUNTER — Other Ambulatory Visit: Payer: Self-pay

## 2021-01-08 ENCOUNTER — Encounter (HOSPITAL_BASED_OUTPATIENT_CLINIC_OR_DEPARTMENT_OTHER): Payer: Self-pay | Admitting: Surgery

## 2021-01-09 ENCOUNTER — Telehealth: Payer: Self-pay | Admitting: Neurology

## 2021-01-09 ENCOUNTER — Ambulatory Visit (HOSPITAL_COMMUNITY)
Admission: RE | Admit: 2021-01-09 | Discharge: 2021-01-09 | Disposition: A | Discharge status: Home / Self Care | Payer: No Typology Code available for payment source | Source: Ambulatory Visit | Attending: Nephrology | Admitting: Nephrology

## 2021-01-09 ENCOUNTER — Encounter (HOSPITAL_BASED_OUTPATIENT_CLINIC_OR_DEPARTMENT_OTHER)
Admission: RE | Admit: 2021-01-09 | Discharge: 2021-01-09 | Disposition: A | Discharge status: Home / Self Care | Payer: No Typology Code available for payment source | Source: Ambulatory Visit | Attending: Surgery | Admitting: Surgery

## 2021-01-09 VITALS — BP 98/58 | HR 62 | Temp 97.0°F | Resp 18

## 2021-01-09 DIAGNOSIS — Z01812 Encounter for preprocedural laboratory examination: Secondary | ICD-10-CM | POA: Diagnosis not present

## 2021-01-09 DIAGNOSIS — N184 Chronic kidney disease, stage 4 (severe): Secondary | ICD-10-CM | POA: Diagnosis not present

## 2021-01-09 DIAGNOSIS — D631 Anemia in chronic kidney disease: Secondary | ICD-10-CM | POA: Diagnosis present

## 2021-01-09 LAB — COMPREHENSIVE METABOLIC PANEL
ALT: 14 U/L (ref 0–44)
AST: 16 U/L (ref 15–41)
Albumin: 3.5 g/dL (ref 3.5–5.0)
Alkaline Phosphatase: 62 U/L (ref 38–126)
Anion gap: 12 (ref 5–15)
BUN: 65 mg/dL — ABNORMAL HIGH (ref 8–23)
CO2: 20 mmol/L — ABNORMAL LOW (ref 22–32)
Calcium: 9.1 mg/dL (ref 8.9–10.3)
Chloride: 104 mmol/L (ref 98–111)
Creatinine, Ser: 4.47 mg/dL — ABNORMAL HIGH (ref 0.61–1.24)
GFR, Estimated: 13 mL/min — ABNORMAL LOW (ref >60)
Glucose, Bld: 171 mg/dL — ABNORMAL HIGH (ref 70–99)
Potassium: 4.4 mmol/L (ref 3.5–5.1)
Sodium: 136 mmol/L (ref 135–145)
Total Bilirubin: 1.4 mg/dL — ABNORMAL HIGH (ref 0.3–1.2)
Total Protein: 6.9 g/dL (ref 6.5–8.1)

## 2021-01-09 LAB — IRON AND TIBC
Iron: 64 ug/dL (ref 45–182)
Saturation Ratios: 24 % (ref 17.9–39.5)
TIBC: 263 ug/dL (ref 250–450)
UIBC: 199 ug/dL

## 2021-01-09 LAB — CBC WITH DIFFERENTIAL/PLATELET
Abs Immature Granulocytes: 0 10*3/uL (ref 0.00–0.07)
Basophils Absolute: 0 10*3/uL (ref 0.0–0.1)
Basophils Relative: 1 %
Eosinophils Absolute: 0.2 10*3/uL (ref 0.0–0.5)
Eosinophils Relative: 4 %
HCT: 34.2 % — ABNORMAL LOW (ref 39.0–52.0)
Hemoglobin: 11.2 g/dL — ABNORMAL LOW (ref 13.0–17.0)
Immature Granulocytes: 0 %
Lymphocytes Relative: 33 %
Lymphs Abs: 1.2 10*3/uL (ref 0.7–4.0)
MCH: 30.8 pg (ref 26.0–34.0)
MCHC: 32.7 g/dL (ref 30.0–36.0)
MCV: 94 fL (ref 80.0–100.0)
Monocytes Absolute: 0.3 10*3/uL (ref 0.1–1.0)
Monocytes Relative: 8 %
Neutro Abs: 2 10*3/uL (ref 1.7–7.7)
Neutrophils Relative %: 54 %
Platelets: 147 10*3/uL — ABNORMAL LOW (ref 150–400)
RBC: 3.64 MIL/uL — ABNORMAL LOW (ref 4.22–5.81)
RDW: 13.9 % (ref 11.5–15.5)
WBC: 3.7 10*3/uL — ABNORMAL LOW (ref 4.0–10.5)
nRBC: 0 % (ref 0.0–0.2)

## 2021-01-09 LAB — FERRITIN: Ferritin: 354 ng/mL — ABNORMAL HIGH (ref 24–336)

## 2021-01-09 LAB — POCT HEMOGLOBIN-HEMACUE: Hemoglobin: 11.5 g/dL — ABNORMAL LOW (ref 13.0–17.0)

## 2021-01-09 MED ORDER — EPOETIN ALFA-EPBX 10000 UNIT/ML IJ SOLN
INTRAMUSCULAR | Status: AC
  Filled 2021-01-09: qty 1

## 2021-01-09 MED ORDER — CHLORHEXIDINE GLUCONATE CLOTH 2 % EX PADS: 6.0000 | MEDICATED_PAD | Freq: Once | CUTANEOUS | Status: DC

## 2021-01-09 MED ORDER — EPOETIN ALFA-EPBX 10000 UNIT/ML IJ SOLN
10000.0000 [IU] | INTRAMUSCULAR | Status: DC
  Administered 2021-01-09: 10000 [IU] via SUBCUTANEOUS

## 2021-01-09 NOTE — Telephone Encounter (Signed)
Pt called in, said he missed a call but doesn't know why he was called. Hes returning a call

## 2021-01-09 NOTE — Progress Notes (Signed)

## 2021-01-09 NOTE — Progress Notes (Signed)
Lab results and chart reviewed with Dr. Sabra Heck. Ok to proceed with surgery as planned at [facility].

## 2021-01-09 NOTE — Telephone Encounter (Signed)
Pt called and informed HbA1c is 8.0 and vitamin B1 is elevated.  OK to stop thiamine supplement and take daily multivitamin.  Remaining labs are normal

## 2021-01-14 ENCOUNTER — Ambulatory Visit (HOSPITAL_BASED_OUTPATIENT_CLINIC_OR_DEPARTMENT_OTHER): Payer: No Typology Code available for payment source | Admitting: Anesthesiology

## 2021-01-14 ENCOUNTER — Encounter (HOSPITAL_BASED_OUTPATIENT_CLINIC_OR_DEPARTMENT_OTHER): Payer: Self-pay | Admitting: Surgery

## 2021-01-14 ENCOUNTER — Other Ambulatory Visit: Payer: Self-pay

## 2021-01-14 ENCOUNTER — Ambulatory Visit (HOSPITAL_BASED_OUTPATIENT_CLINIC_OR_DEPARTMENT_OTHER)
Admission: RE | Admit: 2021-01-14 | Discharge: 2021-01-14 | Disposition: A | Discharge status: Home / Self Care | Payer: No Typology Code available for payment source | Attending: Emergency Medicine | Admitting: Emergency Medicine

## 2021-01-14 ENCOUNTER — Ambulatory Visit (HOSPITAL_COMMUNITY): Payer: No Typology Code available for payment source

## 2021-01-14 ENCOUNTER — Encounter (HOSPITAL_COMMUNITY)
Admission: RE | Disposition: A | Discharge status: Home / Self Care | Payer: Self-pay | Source: Home / Self Care | Attending: Emergency Medicine

## 2021-01-14 DIAGNOSIS — J439 Emphysema, unspecified: Secondary | ICD-10-CM | POA: Insufficient documentation

## 2021-01-14 DIAGNOSIS — I129 Hypertensive chronic kidney disease with stage 1 through stage 4 chronic kidney disease, or unspecified chronic kidney disease: Secondary | ICD-10-CM | POA: Diagnosis not present

## 2021-01-14 DIAGNOSIS — Z79899 Other long term (current) drug therapy: Secondary | ICD-10-CM | POA: Diagnosis not present

## 2021-01-14 DIAGNOSIS — Z888 Allergy status to other drugs, medicaments and biological substances status: Secondary | ICD-10-CM | POA: Insufficient documentation

## 2021-01-14 DIAGNOSIS — R29898 Other symptoms and signs involving the musculoskeletal system: Secondary | ICD-10-CM

## 2021-01-14 DIAGNOSIS — Z885 Allergy status to narcotic agent status: Secondary | ICD-10-CM | POA: Insufficient documentation

## 2021-01-14 DIAGNOSIS — F17210 Nicotine dependence, cigarettes, uncomplicated: Secondary | ICD-10-CM | POA: Diagnosis not present

## 2021-01-14 DIAGNOSIS — K432 Incisional hernia without obstruction or gangrene: Secondary | ICD-10-CM | POA: Diagnosis not present

## 2021-01-14 DIAGNOSIS — E119 Type 2 diabetes mellitus without complications: Secondary | ICD-10-CM | POA: Insufficient documentation

## 2021-01-14 DIAGNOSIS — G939 Disorder of brain, unspecified: Secondary | ICD-10-CM

## 2021-01-14 DIAGNOSIS — Z7982 Long term (current) use of aspirin: Secondary | ICD-10-CM | POA: Insufficient documentation

## 2021-01-14 DIAGNOSIS — K219 Gastro-esophageal reflux disease without esophagitis: Secondary | ICD-10-CM | POA: Diagnosis not present

## 2021-01-14 DIAGNOSIS — R569 Unspecified convulsions: Secondary | ICD-10-CM | POA: Diagnosis not present

## 2021-01-14 DIAGNOSIS — N189 Chronic kidney disease, unspecified: Secondary | ICD-10-CM | POA: Diagnosis not present

## 2021-01-14 HISTORY — DX: Chronic kidney disease, unspecified: N18.9

## 2021-01-14 HISTORY — DX: Chronic obstructive pulmonary disease, unspecified: J44.9

## 2021-01-14 HISTORY — PX: INCISIONAL HERNIA REPAIR: SHX193

## 2021-01-14 LAB — GLUCOSE, CAPILLARY: Glucose-Capillary: 259 mg/dL — ABNORMAL HIGH (ref 70–99)

## 2021-01-14 SURGERY — REPAIR, HERNIA, INCISIONAL
Anesthesia: General | Site: Abdomen

## 2021-01-14 MED ORDER — SUGAMMADEX SODIUM 200 MG/2ML IV SOLN
INTRAVENOUS | Status: DC | PRN
  Administered 2021-01-14: 200 mg via INTRAVENOUS

## 2021-01-14 MED ORDER — PROPOFOL 10 MG/ML IV BOLUS
INTRAVENOUS | Status: DC | PRN
Start: 2021-01-14 — End: 2021-01-14
  Administered 2021-01-14: 150 mg via INTRAVENOUS

## 2021-01-14 MED ORDER — DEXAMETHASONE SODIUM PHOSPHATE 4 MG/ML IJ SOLN
INTRAMUSCULAR | Status: DC | PRN
  Administered 2021-01-14: 4 mg via INTRAVENOUS

## 2021-01-14 MED ORDER — PROPOFOL 10 MG/ML IV BOLUS
INTRAVENOUS | Status: AC
  Filled 2021-01-14: qty 20

## 2021-01-14 MED ORDER — LACTATED RINGERS IV SOLN: INTRAVENOUS | Status: DC

## 2021-01-14 MED ORDER — CEFAZOLIN SODIUM-DEXTROSE 2-4 GM/100ML-% IV SOLN
INTRAVENOUS | Status: AC
  Filled 2021-01-14: qty 100

## 2021-01-14 MED ORDER — HYDROCODONE-ACETAMINOPHEN 5-325 MG PO TABS: 1.0000 | ORAL_TABLET | Freq: Four times a day (QID) | ORAL | 0 refills | Status: DC | PRN

## 2021-01-14 MED ORDER — HYDROMORPHONE HCL 1 MG/ML IJ SOLN: 0.2500 mg | INTRAMUSCULAR | Status: DC | PRN

## 2021-01-14 MED ORDER — BUPIVACAINE-EPINEPHRINE 0.25% -1:200000 IJ SOLN
INTRAMUSCULAR | Status: DC | PRN
  Administered 2021-01-14: 8 mL

## 2021-01-14 MED ORDER — ONDANSETRON HCL 4 MG/2ML IJ SOLN
INTRAMUSCULAR | Status: AC
  Filled 2021-01-14: qty 2

## 2021-01-14 MED ORDER — CEFAZOLIN SODIUM-DEXTROSE 2-4 GM/100ML-% IV SOLN
2.0000 g | INTRAVENOUS | Status: AC
  Administered 2021-01-14: 2 g via INTRAVENOUS

## 2021-01-14 MED ORDER — OXYCODONE HCL 5 MG PO TABS: 5.0000 mg | ORAL_TABLET | Freq: Once | ORAL | Status: DC | PRN

## 2021-01-14 MED ORDER — PROMETHAZINE HCL 25 MG/ML IJ SOLN: 6.2500 mg | INTRAMUSCULAR | Status: DC | PRN

## 2021-01-14 MED ORDER — OXYCODONE HCL 5 MG/5ML PO SOLN
5.0000 mg | Freq: Once | ORAL | Status: DC | PRN
Start: 2021-01-14 — End: 2021-01-14

## 2021-01-14 MED ORDER — ROCURONIUM BROMIDE 100 MG/10ML IV SOLN
INTRAVENOUS | Status: DC | PRN
  Administered 2021-01-14: 90 mg via INTRAVENOUS

## 2021-01-14 MED ORDER — ACETAMINOPHEN 500 MG PO TABS
ORAL_TABLET | ORAL | Status: AC
  Filled 2021-01-14: qty 2

## 2021-01-14 MED ORDER — FENTANYL CITRATE (PF) 100 MCG/2ML IJ SOLN
INTRAMUSCULAR | Status: DC | PRN
  Administered 2021-01-14: 100 ug via INTRAVENOUS

## 2021-01-14 MED ORDER — ONDANSETRON HCL 4 MG/2ML IJ SOLN
INTRAMUSCULAR | Status: DC | PRN
  Administered 2021-01-14: 4 mg via INTRAVENOUS

## 2021-01-14 MED ORDER — LIDOCAINE HCL (CARDIAC) PF 100 MG/5ML IV SOSY
PREFILLED_SYRINGE | INTRAVENOUS | Status: DC | PRN
  Administered 2021-01-14: 80 mg via INTRAVENOUS

## 2021-01-14 MED ORDER — SUGAMMADEX SODIUM 200 MG/2ML IV SOLN: INTRAVENOUS | Status: DC | PRN

## 2021-01-14 MED ORDER — ACETAMINOPHEN 500 MG PO TABS
1000.0000 mg | ORAL_TABLET | ORAL | Status: AC
  Administered 2021-01-14: 1000 mg via ORAL

## 2021-01-14 MED ORDER — FENTANYL CITRATE (PF) 100 MCG/2ML IJ SOLN
INTRAMUSCULAR | Status: AC
  Filled 2021-01-14: qty 2

## 2021-01-14 MED ORDER — AMISULPRIDE (ANTIEMETIC) 5 MG/2ML IV SOLN: 10.0000 mg | Freq: Once | INTRAVENOUS | Status: DC | PRN

## 2021-01-14 SURGICAL SUPPLY — 43 items
BLADE CLIPPER SURG (BLADE) ×2 IMPLANT
BLADE SURG 10 STRL SS (BLADE) ×2 IMPLANT
BLADE SURG 15 STRL LF DISP TIS (BLADE) ×1 IMPLANT
BLADE SURG 15 STRL SS (BLADE) ×1
CANISTER SUCT 1200ML W/VALVE (MISCELLANEOUS) ×2 IMPLANT
CHLORAPREP W/TINT 26 (MISCELLANEOUS) ×2 IMPLANT
COVER BACK TABLE 60X90IN (DRAPES) ×2 IMPLANT
COVER MAYO STAND STRL (DRAPES) ×2 IMPLANT
DECANTER SPIKE VIAL GLASS SM (MISCELLANEOUS) ×2 IMPLANT
DERMABOND ADVANCED (GAUZE/BANDAGES/DRESSINGS) ×1
DERMABOND ADVANCED .7 DNX12 (GAUZE/BANDAGES/DRESSINGS) ×1 IMPLANT
DRAPE LAPAROTOMY TRNSV 102X78 (DRAPES) ×2 IMPLANT
DRAPE UTILITY XL STRL (DRAPES) ×2 IMPLANT
ELECT COATED BLADE 2.86 ST (ELECTRODE) ×2 IMPLANT
ELECT REM PT RETURN 9FT ADLT (ELECTROSURGICAL) ×2
ELECTRODE REM PT RTRN 9FT ADLT (ELECTROSURGICAL) ×1 IMPLANT
GLOVE SRG 8 PF TXTR STRL LF DI (GLOVE) ×1 IMPLANT
GLOVE SURG LTX SZ8 (GLOVE) ×2 IMPLANT
GLOVE SURG POLYISO LF SZ7 (GLOVE) ×2 IMPLANT
GLOVE SURG UNDER POLY LF SZ8 (GLOVE) ×1
GOWN STRL REUS W/ TWL LRG LVL3 (GOWN DISPOSABLE) ×1 IMPLANT
GOWN STRL REUS W/ TWL XL LVL3 (GOWN DISPOSABLE) ×1 IMPLANT
GOWN STRL REUS W/TWL LRG LVL3 (GOWN DISPOSABLE) ×1
GOWN STRL REUS W/TWL XL LVL3 (GOWN DISPOSABLE) ×1
MESH VENTRALEX ST 1-7/10 CRC S (Mesh General) ×2 IMPLANT
NEEDLE HYPO 22GX1.5 SAFETY (NEEDLE) IMPLANT
NEEDLE HYPO 25X1 1.5 SAFETY (NEEDLE) ×2 IMPLANT
NS IRRIG 1000ML POUR BTL (IV SOLUTION) IMPLANT
PACK BASIN DAY SURGERY FS (CUSTOM PROCEDURE TRAY) ×2 IMPLANT
PENCIL SMOKE EVACUATOR (MISCELLANEOUS) ×2 IMPLANT
SLEEVE SCD COMPRESS KNEE MED (STOCKING) ×2 IMPLANT
STAPLER VISISTAT 35W (STAPLE) IMPLANT
SUT MON AB 4-0 PC3 18 (SUTURE) ×2 IMPLANT
SUT NOVA NAB GS-21 1 T12 (SUTURE) IMPLANT
SUT NOVA NAB GS-22 2 0 T19 (SUTURE) IMPLANT
SUT SILK 3 0 SH 30 (SUTURE) IMPLANT
SUT VIC AB 2-0 SH 27 (SUTURE)
SUT VIC AB 2-0 SH 27XBRD (SUTURE) IMPLANT
SUT VICRYL 3-0 CR8 SH (SUTURE) ×2 IMPLANT
SYR CONTROL 10ML LL (SYRINGE) ×2 IMPLANT
TOWEL GREEN STERILE FF (TOWEL DISPOSABLE) ×2 IMPLANT
TUBE CONNECTING 20X1/4 (TUBING) IMPLANT
YANKAUER SUCT BULB TIP NO VENT (SUCTIONS) IMPLANT
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
## 2021-01-14 NOTE — Anesthesia Preprocedure Evaluation (Addendum)
Anesthesia Evaluation    Airway Mallampati: II  TM Distance: >3 FB Neck ROM: Full    Dental  (+) Missing, Edentulous Upper, Poor Dentition   Pulmonary COPD, Current Smoker and Patient abstained from smoking.,    Pulmonary exam normal breath sounds clear to auscultation       Cardiovascular hypertension, Pt. on medications Normal cardiovascular exam Rhythm:Regular Rate:Normal     Neuro/Psych  Headaches,    GI/Hepatic GERD  ,(+)     substance abuse  alcohol use,   Endo/Other  diabetes  Renal/GU Renal InsufficiencyRenal disease     Musculoskeletal   Abdominal   Peds  Hematology   Anesthesia Other Findings   Reproductive/Obstetrics                           Anesthesia Physical Anesthesia Plan  ASA: 3  Anesthesia Plan: General   Post-op Pain Management:    Induction: Intravenous  PONV Risk Score and Plan: 1 and Ondansetron and Treatment may vary due to age or medical condition  Airway Management Planned: Oral ETT  Additional Equipment:   Intra-op Plan:   Post-operative Plan: Extubation in OR  Informed Consent: I have reviewed the patients History and Physical, chart, labs and discussed the procedure including the risks, benefits and alternatives for the proposed anesthesia with the patient or authorized representative who has indicated his/her understanding and acceptance.     Dental advisory given  Plan Discussed with: CRNA  Anesthesia Plan Comments:         Anesthesia Quick Evaluation

## 2021-01-14 NOTE — H&P (Signed)
Chief Complaint: Hernia   History of Present Illness: Tyler Patterson is a 70 y.o. male who is seen today as an office consultation at the request of Dr. Posey Pronto for evaluation of Hernia .   Patient presents for evaluation of incisional hernia. He had a laparoscopic sigmoid colectomy thinks 5 or 6 years ago for diverticulitis. He has never had a colostomy. He has developed swelling over the incision just above his umbilicus which was the extraction site. There is some pain as well. Has become more symptomatic with more discomfort especially after he eats. No nausea or vomiting. No history of bowel obstruction or signs of incarceration or strangulation.  Review of Systems: A complete review of systems was obtained from the patient. I have reviewed this information and discussed as appropriate with the patient. See HPI as well for other ROS.  Review of Systems  Constitutional: Negative.  HENT: Negative.  Eyes: Negative.  Respiratory: Positive for wheezing.  Cardiovascular: Negative.  Gastrointestinal: Positive for abdominal pain. Negative for diarrhea and vomiting.  Genitourinary: Negative.  Musculoskeletal: Negative.  Skin: Negative.  Neurological: Negative.  Endo/Heme/Allergies: Negative.  Psychiatric/Behavioral: Negative.    Medical History: Past Medical History:  Diagnosis Date   Chronic kidney disease   Hypertension   Liver disease   There is no problem list on file for this patient.  History reviewed. No pertinent surgical history.   Allergies  Allergen Reactions   Butalbital-Aspirin-Caffeine Swelling   Current Outpatient Medications on File Prior to Visit  Medication Sig Dispense Refill   atorvastatin (LIPITOR) 10 MG tablet Take 10 mg by mouth once daily   carvediloL (COREG) 25 MG tablet Take by mouth   cholecalciferol (CHOLECALCIFEROL) 1000 unit tablet Take by mouth   FUROsemide (LASIX) 10 mg/mL injection Inject into the vein once   FUROsemide (LASIX) 20 MG tablet  Take 20 mg by mouth once daily   hydrALAZINE (APRESOLINE) 50 MG tablet Take 50 mg by mouth 3 (three) times daily   losartan (COZAAR) 100 MG tablet Take by mouth   pregabalin (LYRICA) 150 MG capsule Take 150 mg by mouth 2 (two) times daily   thiamine (VITAMIN B-1) 100 mg/mL injection Inject into the muscle once   No current facility-administered medications on file prior to visit.   Family History  Problem Relation Age of Onset   High blood pressure (Hypertension) Mother   Diabetes Sister    Social History   Tobacco Use  Smoking Status Smoker, Current Status Unknown  Smokeless Tobacco Never Used    Social History   Socioeconomic History   Marital status: Married  Tobacco Use   Smoking status: Smoker, Current Status Unknown   Smokeless tobacco: Never Used  Substance and Sexual Activity   Alcohol use: Yes  Comment: 4 per weekend   Drug use: Never   Objective:   Vitals:  12/16/20 1038  BP: (!) 140/60  Pulse: 74  Temp: 36.3 C (97.3 F)  SpO2: 98%  Weight: 86.5 kg (190 lb 9.6 oz)  Height: 172.7 cm ('5\' 8"'$ )   Body mass index is 28.98 kg/m.  Physical Exam Constitutional:  Appearance: Normal appearance.  HENT:  Head: Normocephalic.  Nose: Nose normal.  Mouth/Throat:  Mouth: Mucous membranes are moist.  Eyes:  Extraocular Movements: Extraocular movements intact.  Pupils: Pupils are equal, round, and reactive to light.  Cardiovascular:  Rate and Rhythm: Regular rhythm.  Pulmonary:  Effort: Pulmonary effort is normal.  Breath sounds: No stridor.  Abdominal:  Palpations: Abdomen is  soft.  Tenderness: There is no abdominal tenderness.  Hernia: A hernia is present.   Musculoskeletal:  General: Normal range of motion.  Cervical back: Normal range of motion and neck supple.  Skin: General: Skin is warm and dry.  Neurological:  General: No focal deficit present.  Mental Status: He is alert and oriented to person, place, and time.  Psychiatric:  Mood and  Affect: Mood normal.  Behavior: Behavior normal.     Labs, Imaging and Diagnostic Testing:  FResults for ZYLAN, MARINARO (MRN [id number]) as of 12/16/2020 12:22 Ref. Range 11/14/2020 08:33  Iron Latest Ref Range: 45 - 182 ug/dL 71  UIBC Latest Units: ug/dL 223  TIBC Latest Ref Range: 250 - 450 ug/dL 294  Saturation Ratios Latest Ref Range: 17.9 - 39.5 % 24  Ferritin Latest Ref Range: 24 - 336 ng/mL 306  WBC Latest Ref Range: 4.0 - 10.5 K/uL 4.6  RBC Latest Ref Range: 4.22 - 5.81 MIL/uL 3.02 (L)  Hemoglobin Latest Ref Range: 13.0 - 17.0 g/dL 9.3 (L)  HCT Latest Ref Range: 39.0 - 52.0 % 27.9 (L)  MCV Latest Ref Range: 80.0 - 100.0 fL 92.4  MCH Latest Ref Range: 26.0 - 34.0 pg 30.8  MCHC Latest Ref Range: 30.0 - 36.0 g/dL 33.3  RDW Latest Ref Range: 11.5 - 15.5 % 14.3  Platelets Latest Ref Range: 150 - 400 K/uL 128 (L)  nRBC Latest Ref Range: 0.0 - 0.2 % 0.0  Neutrophils Latest Units: % 62  Lymphocytes Latest Units: % 22  Monocytes Relative Latest Units: % 12  Eosinophil Latest Units: % 4  Basophil Latest Units: % 0  Immature Granulocytes Latest Units: % 0  NEUT# Latest Ref Range: 1.7 - 7.7 K/uL 2.8  Lymphocyte # Latest Ref Range: 0.7 - 4.0 K/uL 1.0  Monocyte # Latest Ref Range: 0.1 - 1.0 K/uL 0.6  Eosinophils Absolute Latest Ref Range: 0.0 - 0.5 K/uL 0.2  Basophils Absolute Latest Ref Range: 0.0 - 0.1 K/uL 0.0  Abs Immature Granulocytes Latest Ref Range: 0.00 - 0.07 K/uL 0.01   Assessment and Plan:  Diagnoses and all orders for this visit:  Incisional hernia without obstruction or gangrene    Discussed repair options with mesh. Risks and benefits of surgery discussed. Risk factors for recurrence were discussed which include tobacco abuse, obesity and underlying medical problems. He stopped smoking 6 years ago but does have emphysema. He is on no home oxygen. No history of chest pain or shortness of breath. 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  He has been evaluated for a kidney transplant. We discussed open repair of his small incisional hernia which appears to be at the extraction site at his umbilicus. He feels to be about 2 cm and reducible. Risks and benefits of repair discussed. Techniques of repair discussed. Laparoscopic with mesh and open discussed. Use of mesh and chronic mesh complications were discussed today to include pain, infection, erosion into bowel, and extrusion. After the above he is opted for open repair of his incisional hernia with mesh. Risk of bleeding, infection, bowel injury, bladder injury, bowel resection, the need for the treatments procedures, death, DVT, myocardial infarction, exacerbation of underlying medical problems or worsening of underlying medical problems.  No follow-ups on file.  Kennieth Francois, MD

## 2021-01-14 NOTE — ED Notes (Signed)
Received verbal report from Callie B RN at this time 

## 2021-01-14 NOTE — Op Note (Signed)
Preoperative diagnosis: Reducible incisional hernia midline  Postoperative diagnosis: Same  Procedure: Repair of incisional hernia with mesh  Surgeon: Erroll Luna, MD  Anesthesia: General with 0.25% Marcaine plain  EBL: Minimal  Specimen: None  Drains: None  IV fluids: Per anesthesia record  Indications for procedure: The patient is a 70 year old male with a small incisional hernia just above his umbilicus.  This is from a previous extraction site from laparoscopic surgery.  Defect is about 2 cm.  Location is about 2 cm above the umbilicus.  We discussed pros and cons of repair and techniques of repair to include both laparoscopic and open.  Given the small size I recommend an open approach with mesh.The risk of hernia repair include bleeding,  Infection,   Recurrence of the hernia,  Mesh use, chronic pain,  Organ injury,  Bowel injury,  Bladder injury,   nerve injury with numbness around the incision,  Death,  and worsening of preexisting  medical problems.  The alternatives to surgery have been discussed as well..  Long term expectations of both operative and non operative treatments have been discussed.   The patient agrees to proceed.      Description of procedure: The patient was met in the holding area and questions were answered.  The procedure was reviewed as well as the operation and repair of incisional hernia at his umbilicus.  All questions were answered.  He was then taken back to the operating room.  She is placed supine upon the OR table.  After induction of general esthesia, the abdomen was prepped and draped in a sterile fashion timeout performed.  Proper patient, site and procedure verified.  Local anesthetic infiltrated to the scar at his umbilicus from previous extraction site surgery.  Hernia was palpated easily was about a 2 cm defect as it was seen in the office.  Incision was made the old scar.  Dissection was carried down and the hernia sac was identified.  It was  reduced back into the preperitoneal space.  The fascial defect was about a centimeter and half in maximal diameter.  Opted to repair this with a 4.3 cm circular ventral light mesh with coat.  This was placed in a subfascial position.  He was secured to the the fascia with #1 Novafil sutures.  Of note there is about a 3 cm overlap.  Once the fascial defect was closed the fascia was then closed over the mesh with #1 Novafil.  3-0 Vicryl was used to close the subcutaneous space.  4 Monocryl used to close the skin.  Local anesthetic infiltrated around the wound.  All counts were found to be correct.  Dermabond applied.  Patient awoke extubated taken to recovery in satisfactory condition.

## 2021-01-14 NOTE — Discharge Instructions (Addendum)
Your MRI showed a lesion in your brain that needs to be followed up with neurology. Call their office listed below to set up a follow up appointment. Return to the ER with any new, worsening, or concerning symptoms.     [facility] _______[facility]  UMBILICAL OR INGUINAL HERNIA REPAIR: POST OP INSTRUCTIONS  Always review your discharge instruction sheet given to you by the facility where your surgery was performed. IF YOU HAVE DISABILITY OR FAMILY LEAVE FORMS, YOU MUST BRING THEM TO THE OFFICE FOR PROCESSING.   DO NOT GIVE THEM TO YOUR DOCTOR.  1. A  prescription for pain medication may be given to you upon discharge.  Take your pain medication as prescribed, if needed.  If narcotic pain medicine is not needed, then you may take acetaminophen (Tylenol) or ibuprofen (Advil) as needed. 2. Take your usually prescribed medications unless otherwise directed. If you need a refill on your pain medication, please contact your pharmacy.  They will contact our office to request authorization. Prescriptions will not be filled after 5 pm or on week-ends. 3. You should follow a light diet the first 24 hours after arrival home, such as soup and crackers, etc.  Be sure to include lots of fluids daily.  Resume your normal diet the day after surgery. 4.Most patients will experience some swelling and bruising around the umbilicus or in the groin and scrotum.  Ice packs and reclining will help.  Swelling and bruising can take several days to resolve.  6. It is common to experience some constipation if taking pain medication after surgery.  Increasing fluid intake and taking a stool softener (such as Colace) will usually help or prevent this problem from occurring.  A mild laxative (Milk of Magnesia or Miralax) should be taken according to package directions if there are no bowel movements after 48 hours. 7. Unless discharge instructions indicate otherwise, you may remove your bandages 24-48 hours after  surgery, and you may shower at that time.  You may have steri-strips (small skin tapes) in place directly over the incision.  These strips should be left on the skin for 7-10 days.  If your surgeon used skin glue on the incision, you may shower in 24 hours.  The glue will flake off over the next 2-3 weeks.  Any sutures or staples will be removed at the office during your follow-up visit. 8. ACTIVITIES:  You may resume regular (light) daily activities beginning the next day--such as daily self-care, walking, climbing stairs--gradually increasing activities as tolerated.  You may have sexual intercourse when it is comfortable.  Refrain from any heavy lifting or straining until approved by your doctor.  a.You may drive when you are no longer taking prescription pain medication, you can comfortably wear a seatbelt, and you can safely maneuver your car and apply brakes. b.RETURN TO WORK:   _____________________________________________  9.You should see your doctor in the office for a follow-up appointment approximately 2-3 weeks after your surgery.  Make sure that you call for this appointment within a day or two after you arrive home to insure a convenient appointment time. 10.OTHER INSTRUCTIONS: _________________________    _____________________________________  WHEN TO CALL YOUR DOCTOR: Fever over 101.0 Inability to urinate Nausea and/or vomiting Extreme swelling or bruising Continued bleeding from incision. Increased pain, redness, or drainage from the incision  The clinic staff is available to answer your questions during regular business hours.  Please don't hesitate to call and ask to speak to one of  the nurses for clinical concerns.  If you have a medical emergency, go to the nearest emergency room or call 911.  A surgeon from [facility] is always on call at the hospital   [address] [place], [place], [place]  [address] ?  [address] [place], [place], [place] [phone] ? [phone] ? FAX [phone] Web site: [email or web address]      Post Anesthesia Home Care Instructions  Activity: Get plenty of rest for the remainder of the day. A responsible individual must stay with you for 24 hours following the procedure.  For the next 24 hours, DO NOT: -Drive a car -Paediatric nurse -Drink alcoholic beverages -Take any medication unless instructed by your physician -Make any legal decisions or sign important papers.  Meals: Start with liquid foods such as gelatin or soup. Progress to regular foods as tolerated. Avoid greasy, spicy, heavy foods. If nausea and/or vomiting occur, drink only clear liquids until the nausea and/or vomiting subsides. Call your physician if vomiting continues.  Special Instructions/Symptoms: Your throat may feel dry or sore from the anesthesia or the breathing tube placed in your throat during surgery. If this causes discomfort, gargle with warm salt water. The discomfort should disappear within 24 hours.  If you had a scopolamine patch placed behind your ear for the management of post- operative nausea and/or vomiting:  1. The medication in the patch is effective for 72 hours, after which it should be removed.  Wrap patch in a tissue and discard in the trash. Wash hands thoroughly with soap and water. 2. You may remove the patch earlier than 72 hours if you experience unpleasant side effects which may include dry mouth, dizziness or visual disturbances. 3. Avoid touching the patch. Wash your hands with soap and water after contact with the patch.     Call your surgeon if you experience:   1.  Fever over 101.0. 2.  Inability to urinate. 3.  Nausea and/or vomiting. 4.  Extreme swelling or bruising at the surgical site. 5.  Continued bleeding from the incision. 6.  Increased pain, redness or drainage from the incision. 7.  Problems related to your pain medication. 8.  Any problems and/or  concerns

## 2021-01-14 NOTE — Anesthesia Procedure Notes (Signed)
Procedure Name: Intubation Date/Time: 01/14/2021 1:11 AM Performed by: Glory Buff, CRNA Pre-anesthesia Checklist: Patient identified, Emergency Drugs available, Suction available and Patient being monitored Patient Re-evaluated:Patient Re-evaluated prior to induction Oxygen Delivery Method: Circle system utilized Preoxygenation: Pre-oxygenation with 100% oxygen Induction Type: IV induction Ventilation: Mask ventilation without difficulty Laryngoscope Size: Miller and 3 Grade View: Grade I Tube type: Oral Tube size: 7.5 mm Number of attempts: 1 Airway Equipment and Method: Stylet and Oral airway Placement Confirmation: ETT inserted through vocal cords under direct vision, positive ETCO2 and breath sounds checked- equal and bilateral Secured at: 22 cm Tube secured with: Tape Dental Injury: Teeth and Oropharynx as per pre-operative assessment

## 2021-01-14 NOTE — ED Triage Notes (Signed)
Pt BIB [facility] from day surgery center, pt had a hernia repair, given fentanyl at 1104 and rocc at 1109 this morning. Post-surgery pt unable to move his extremities. On arrival to ED, pt ambulatory with stand-by assist. Moving all extremities at this time, A&Ox4.

## 2021-01-14 NOTE — Anesthesia Postprocedure Evaluation (Signed)
Anesthesia Post Note  Patient: NATHAN PINSON  Procedure(s) Performed: REPAIR OF INCISIONAL HERNIA WITH MESH (Abdomen)     Patient location during evaluation: PACU Anesthesia Type: General Level of consciousness: awake and alert Pain management: pain level controlled Vital Signs Assessment: post-procedure vital signs reviewed and stable Respiratory status: spontaneous breathing, nonlabored ventilation and respiratory function stable Cardiovascular status: blood pressure returned to baseline and stable Postop Assessment: no apparent nausea or vomiting Anesthetic complications: no   No notable events documented.  Last Vitals:  Vitals:   01/14/21 1219 01/14/21 1233  BP:  (!) 182/72  Pulse: (!) 58 61  Resp: (!) 9   Temp:  36.6 C  SpO2: 99% 100%    Last Pain:  Vitals:   01/14/21 1233  TempSrc: Oral  PainSc: 0-No pain                 Lynda Rainwater

## 2021-01-14 NOTE — Transfer of Care (Signed)
Immediate Anesthesia Transfer of Care Note  Patient: Tyler Patterson  Procedure(s) Performed: REPAIR OF INCISIONAL HERNIA WITH MESH (Abdomen)  Patient Location: PACU  Anesthesia Type:General  Level of Consciousness: drowsy, patient cooperative and responds to stimulation  Airway & Oxygen Therapy: Patient Spontanous Breathing and Patient connected to face mask oxygen  Post-op Assessment: Report given to RN and Post -op Vital signs reviewed and stable  Post vital signs: Reviewed and stable  Last Vitals:  Vitals Value Taken Time  BP    Temp    Pulse    Resp    SpO2      Last Pain:  Vitals:   01/14/21 0919  TempSrc: Oral  PainSc: 0-No pain         Complications: No notable events documented.

## 2021-01-14 NOTE — Interval H&P Note (Signed)
History and Physical Interval Note:  01/14/2021 10:42 AM  [name]  has presented today for surgery, with the diagnosis of INCISIONAL HERNIA.  The various methods of treatment have been discussed with the patient and family. After consideration of risks, benefits and other options for treatment, the patient has consented to  Procedure(s): [place] (N/A) as a surgical intervention.  The patient's history has been reviewed, patient examined, no change in status, stable for surgery.  I have reviewed the patient's chart and labs.  Questions were answered to the patient's satisfaction.     Tyler Patterson

## 2021-01-16 ENCOUNTER — Encounter (HOSPITAL_BASED_OUTPATIENT_CLINIC_OR_DEPARTMENT_OTHER): Payer: Self-pay | Admitting: Surgery

## 2021-01-16 ENCOUNTER — Telehealth: Payer: Self-pay | Admitting: Neurology

## 2021-01-16 NOTE — Telephone Encounter (Signed)
Please let pt know that I see age-related changes on his MRI and possibly a small area of thickening overlying the brain covering, but nothing worrisome.  We can order MRI brain with contrast to look into that area with more detail.  Thanks.

## 2021-01-16 NOTE — Telephone Encounter (Signed)
Called patients sister and informed her that Dr. Posey Pronto has reviewed the MRI and it shows age-related changes on his MRI and possibly a small area of thickening overlying the brain covering, but nothing worrisome. Informed patients sister that we can order a MRI brain with contrast to look into that area with more detail. Patients daughter stated that she would like to speak to patient first and they will give [place] a call back if they would like to proceed with the MRI brain with contrast.

## 2021-01-16 NOTE — Telephone Encounter (Signed)
Pt's sister called in stating the patient had hernia surgery on 01/14/21. Once recovering he could not walk. They did an MRI to see if he might have had a stroke, but he had not. They did see some lesions. He can now walk, but they are worried about the lesions.

## 2021-01-23 ENCOUNTER — Ambulatory Visit (HOSPITAL_COMMUNITY)
Admission: RE | Admit: 2021-01-23 | Discharge: 2021-01-23 | Disposition: A | Discharge status: Home / Self Care | Payer: Medicare Other | Source: Ambulatory Visit | Attending: Nephrology | Admitting: Nephrology

## 2021-01-23 ENCOUNTER — Other Ambulatory Visit: Payer: Self-pay

## 2021-01-23 VITALS — BP 142/60 | HR 60 | Temp 97.1°F | Resp 18

## 2021-01-23 DIAGNOSIS — N184 Chronic kidney disease, stage 4 (severe): Secondary | ICD-10-CM | POA: Diagnosis present

## 2021-01-23 DIAGNOSIS — D631 Anemia in chronic kidney disease: Secondary | ICD-10-CM | POA: Insufficient documentation

## 2021-01-23 LAB — POCT HEMOGLOBIN-HEMACUE: Hemoglobin: 10.9 g/dL — ABNORMAL LOW (ref 13.0–17.0)

## 2021-01-23 MED ORDER — EPOETIN ALFA-EPBX 10000 UNIT/ML IJ SOLN
INTRAMUSCULAR | Status: AC
  Filled 2021-01-23: qty 1

## 2021-01-23 MED ORDER — EPOETIN ALFA-EPBX 10000 UNIT/ML IJ SOLN
10000.0000 [IU] | INTRAMUSCULAR | Status: DC
  Administered 2021-01-23: 10000 [IU] via SUBCUTANEOUS

## 2021-01-30 ENCOUNTER — Other Ambulatory Visit: Payer: No Typology Code available for payment source

## 2021-02-06 ENCOUNTER — Other Ambulatory Visit: Payer: Self-pay

## 2021-02-06 ENCOUNTER — Encounter (HOSPITAL_COMMUNITY)
Admission: RE | Admit: 2021-02-06 | Discharge: 2021-02-06 | Disposition: A | Discharge status: Home / Self Care | Payer: No Typology Code available for payment source | Source: Ambulatory Visit | Attending: Nephrology | Admitting: Nephrology

## 2021-02-06 VITALS — BP 115/61 | HR 68 | Temp 97.0°F | Resp 18

## 2021-02-06 DIAGNOSIS — N184 Chronic kidney disease, stage 4 (severe): Secondary | ICD-10-CM | POA: Insufficient documentation

## 2021-02-06 DIAGNOSIS — D631 Anemia in chronic kidney disease: Secondary | ICD-10-CM | POA: Diagnosis present

## 2021-02-06 LAB — IRON AND TIBC
Iron: 61 ug/dL (ref 45–182)
Saturation Ratios: 22 % (ref 17.9–39.5)
TIBC: 273 ug/dL (ref 250–450)
UIBC: 212 ug/dL

## 2021-02-06 LAB — FERRITIN: Ferritin: 222 ng/mL (ref 24–336)

## 2021-02-06 LAB — POCT HEMOGLOBIN-HEMACUE: Hemoglobin: 11.8 g/dL — ABNORMAL LOW (ref 13.0–17.0)

## 2021-02-06 MED ORDER — EPOETIN ALFA-EPBX 10000 UNIT/ML IJ SOLN
10000.0000 [IU] | INTRAMUSCULAR | Status: DC
  Administered 2021-02-06: 10000 [IU] via SUBCUTANEOUS

## 2021-02-06 MED ORDER — EPOETIN ALFA-EPBX 10000 UNIT/ML IJ SOLN
INTRAMUSCULAR | Status: AC
  Filled 2021-02-06: qty 1

## 2021-02-24 ENCOUNTER — Other Ambulatory Visit: Payer: Self-pay

## 2021-02-24 ENCOUNTER — Encounter (HOSPITAL_COMMUNITY)
Admission: RE | Admit: 2021-02-24 | Discharge: 2021-02-24 | Disposition: A | Discharge status: Home / Self Care | Payer: No Typology Code available for payment source | Source: Ambulatory Visit | Attending: Nephrology | Admitting: Nephrology

## 2021-02-24 VITALS — BP 119/69 | HR 62 | Temp 97.2°F | Resp 18

## 2021-02-24 DIAGNOSIS — N184 Chronic kidney disease, stage 4 (severe): Secondary | ICD-10-CM | POA: Diagnosis not present

## 2021-02-24 LAB — POCT HEMOGLOBIN-HEMACUE: Hemoglobin: 12.2 g/dL — ABNORMAL LOW (ref 13.0–17.0)

## 2021-02-24 MED ORDER — EPOETIN ALFA-EPBX 10000 UNIT/ML IJ SOLN: 10000.0000 [IU] | INTRAMUSCULAR | Status: DC

## 2021-03-10 ENCOUNTER — Encounter (HOSPITAL_COMMUNITY): Payer: No Typology Code available for payment source

## 2021-03-27 ENCOUNTER — Other Ambulatory Visit: Payer: Self-pay

## 2021-03-27 ENCOUNTER — Ambulatory Visit (HOSPITAL_COMMUNITY)
Admission: RE | Admit: 2021-03-27 | Discharge: 2021-03-27 | Disposition: A | Discharge status: Home / Self Care | Payer: No Typology Code available for payment source | Source: Ambulatory Visit | Attending: Nephrology | Admitting: Nephrology

## 2021-03-27 VITALS — BP 158/86 | HR 59 | Temp 97.1°F | Resp 19

## 2021-03-27 DIAGNOSIS — N184 Chronic kidney disease, stage 4 (severe): Secondary | ICD-10-CM | POA: Diagnosis not present

## 2021-03-27 DIAGNOSIS — D631 Anemia in chronic kidney disease: Secondary | ICD-10-CM | POA: Diagnosis present

## 2021-03-27 LAB — FERRITIN: Ferritin: 278 ng/mL (ref 24–336)

## 2021-03-27 LAB — IRON AND TIBC
Iron: 89 ug/dL (ref 45–182)
Saturation Ratios: 32 % (ref 17.9–39.5)
TIBC: 277 ug/dL (ref 250–450)
UIBC: 188 ug/dL

## 2021-03-27 LAB — POCT HEMOGLOBIN-HEMACUE: Hemoglobin: 10.1 g/dL — ABNORMAL LOW (ref 13.0–17.0)

## 2021-03-27 MED ORDER — EPOETIN ALFA-EPBX 10000 UNIT/ML IJ SOLN
10000.0000 [IU] | INTRAMUSCULAR | Status: DC
  Administered 2021-03-27: 08:00:00 10000 [IU] via SUBCUTANEOUS

## 2021-03-27 MED ORDER — EPOETIN ALFA-EPBX 10000 UNIT/ML IJ SOLN
INTRAMUSCULAR | Status: AC
  Filled 2021-03-27: qty 1

## 2021-04-04 ENCOUNTER — Encounter (HOSPITAL_COMMUNITY): Payer: Self-pay

## 2021-04-06 ENCOUNTER — Emergency Department (HOSPITAL_COMMUNITY)
Admission: EM | Admit: 2021-04-06 | Discharge: 2021-04-06 | Disposition: A | Discharge status: Home / Self Care | Payer: No Typology Code available for payment source | Attending: Emergency Medicine | Admitting: Emergency Medicine

## 2021-04-06 ENCOUNTER — Emergency Department (HOSPITAL_COMMUNITY): Payer: No Typology Code available for payment source

## 2021-04-06 DIAGNOSIS — R531 Weakness: Secondary | ICD-10-CM | POA: Diagnosis not present

## 2021-04-06 DIAGNOSIS — R600 Localized edema: Secondary | ICD-10-CM | POA: Insufficient documentation

## 2021-04-06 DIAGNOSIS — N189 Chronic kidney disease, unspecified: Secondary | ICD-10-CM

## 2021-04-06 DIAGNOSIS — J449 Chronic obstructive pulmonary disease, unspecified: Secondary | ICD-10-CM | POA: Diagnosis not present

## 2021-04-06 DIAGNOSIS — I129 Hypertensive chronic kidney disease with stage 1 through stage 4 chronic kidney disease, or unspecified chronic kidney disease: Secondary | ICD-10-CM | POA: Insufficient documentation

## 2021-04-06 DIAGNOSIS — M5441 Lumbago with sciatica, right side: Secondary | ICD-10-CM | POA: Diagnosis not present

## 2021-04-06 DIAGNOSIS — M545 Low back pain, unspecified: Secondary | ICD-10-CM | POA: Diagnosis present

## 2021-04-06 DIAGNOSIS — Z7982 Long term (current) use of aspirin: Secondary | ICD-10-CM | POA: Diagnosis not present

## 2021-04-06 DIAGNOSIS — Z79899 Other long term (current) drug therapy: Secondary | ICD-10-CM | POA: Insufficient documentation

## 2021-04-06 DIAGNOSIS — G8929 Other chronic pain: Secondary | ICD-10-CM

## 2021-04-06 LAB — CBC
HCT: 31 % — ABNORMAL LOW (ref 39.0–52.0)
Hemoglobin: 10.5 g/dL — ABNORMAL LOW (ref 13.0–17.0)
MCH: 30.8 pg (ref 26.0–34.0)
MCHC: 33.9 g/dL (ref 30.0–36.0)
MCV: 90.9 fL (ref 80.0–100.0)
Platelets: 163 10*3/uL (ref 150–400)
RBC: 3.41 MIL/uL — ABNORMAL LOW (ref 4.22–5.81)
RDW: 14.9 % (ref 11.5–15.5)
WBC: 5.5 10*3/uL (ref 4.0–10.5)
nRBC: 0 % (ref 0.0–0.2)

## 2021-04-06 LAB — BASIC METABOLIC PANEL
Anion gap: 13 (ref 5–15)
BUN: 55 mg/dL — ABNORMAL HIGH (ref 8–23)
CO2: 22 mmol/L (ref 22–32)
Calcium: 9.1 mg/dL (ref 8.9–10.3)
Chloride: 102 mmol/L (ref 98–111)
Creatinine, Ser: 4.38 mg/dL — ABNORMAL HIGH (ref 0.61–1.24)
GFR, Estimated: 14 mL/min — ABNORMAL LOW (ref >60)
Glucose, Bld: 252 mg/dL — ABNORMAL HIGH (ref 70–99)
Potassium: 4.4 mmol/L (ref 3.5–5.1)
Sodium: 137 mmol/L (ref 135–145)

## 2021-04-06 LAB — URINALYSIS, ROUTINE W REFLEX MICROSCOPIC
Bilirubin Urine: NEGATIVE
Glucose, UA: >=500 mg/dL — AB
Ketones, ur: NEGATIVE mg/dL
Nitrite: POSITIVE — AB
Protein, ur: 100 mg/dL — AB
Specific Gravity, Urine: 1.02 (ref 1.005–1.030)
pH: 6 (ref 5.0–8.0)

## 2021-04-06 LAB — CBG MONITORING, ED: Glucose-Capillary: 241 mg/dL — ABNORMAL HIGH (ref 70–99)

## 2021-04-06 LAB — URINALYSIS, MICROSCOPIC (REFLEX): WBC, UA: >50 WBC/hpf (ref 0–5)

## 2021-04-06 MED ORDER — HYDROCODONE-ACETAMINOPHEN 5-325 MG PO TABS: 1.0000 | ORAL_TABLET | Freq: Four times a day (QID) | ORAL | 0 refills | Status: DC | PRN

## 2021-04-06 MED ORDER — FUROSEMIDE 10 MG/ML IJ SOLN
40.0000 mg | Freq: Once | INTRAMUSCULAR | Status: AC
  Administered 2021-04-06: 40 mg via INTRAVENOUS
  Filled 2021-04-06: qty 4

## 2021-04-06 MED ORDER — HYDROCODONE-ACETAMINOPHEN 5-325 MG PO TABS
1.0000 | ORAL_TABLET | Freq: Once | ORAL | Status: AC
Start: 2021-04-06 — End: 2021-04-06
  Administered 2021-04-06: 1 via ORAL
  Filled 2021-04-06: qty 1

## 2021-04-06 MED ORDER — HYDRALAZINE HCL 25 MG PO TABS
50.0000 mg | ORAL_TABLET | Freq: Once | ORAL | Status: AC
  Administered 2021-04-06: 50 mg via ORAL
  Filled 2021-04-06: qty 2

## 2021-04-06 MED ORDER — CARVEDILOL 12.5 MG PO TABS
25.0000 mg | ORAL_TABLET | Freq: Once | ORAL | Status: AC
  Administered 2021-04-06: 25 mg via ORAL
  Filled 2021-04-06: qty 2

## 2021-04-06 NOTE — ED Notes (Signed)
Pt ambulated self with his home cane around his exam room without issue. No outstanding balance issues or c/o worsening pain.

## 2021-04-06 NOTE — ED Notes (Signed)
E-signature pad unavailable at time of pt discharge. This RN discussed discharge materials with pt and answered all pt questions. Pt stated understanding of discharge material. ? ?

## 2021-04-06 NOTE — ED Triage Notes (Signed)
Pt arrives via EMS from home with generalized weakness. Also states he needs dialysis, pt has never had dialysis but needs to start. Increased n/v. Pt has been refusing to start dialysis for months and took self off kidney transplant list.

## 2021-04-06 NOTE — ED Provider Triage Note (Signed)
Emergency Medicine Provider Triage Evaluation Note  BRITTON BERA , a 71 y.o. male  was evaluated in triage.  Pt complains of generalized weakness and back pain. Patient states he is needing dialysis but has not started yet. He has been refusing dialysis for months and took himself of the kidney transplant list. No recent illnesses or injury.  Review of Systems  Positive: Back pain, dizziness, nauseous, vomiting Negative: Fevers, chills, chest pain, abdominal pain  Physical Exam  BP (!) 177/80 (BP Location: Right Arm)    Pulse 72    Temp 98.5 F (36.9 C) (Oral)    Resp 17    SpO2 96%  Gen:   Awake, no distress   Resp:  Normal effort  MSK:   Moves extremities without difficulty  Other:  No slurred speech, no facial droop  Medical Decision Making  Medically screening exam initiated at 12:38 PM.  Appropriate orders placed.  TERRYL MOLINELLI was informed that the remainder of the evaluation will be completed by another provider, this initial triage assessment does not replace that evaluation, and the importance of remaining in the ED until their evaluation is complete.  Will obtain basic labs   Kateri Plummer, PA-C 04/06/21 1240

## 2021-04-06 NOTE — ED Provider Notes (Signed)
[facility] EMERGENCY DEPARTMENT Provider Note   CSN: [id number] Arrival date & time: 04/06/21  1217     History  Chief Complaint  Patient presents with   Weakness    Tyler Patterson is a 71 y.o. male.  Patient is a 71 year old male with a history of hypertension, seizures, COPD,, CKD, diabetes who is presenting today with complaint of back pain and inability to walk.  Patient reports that he has been having back pain for quite some time but since having hernia surgery several months ago he has had difficulty walking.  He reports when he stands up it feels like somebody is putting a weight on his head and pushing him down and slightly backwards.  He reports that his back hurts in the lower portion of his back all the time but when he tries to get up and walk it seems to be worse.  The pain intermittently will radiate down the legs it seems to be a little bit worse running down the right leg.  Today he reported he came to the emergency room because his back was hurting but he was unable to get up and walk today.  He has weakness in his legs which has been going on for some time.  When he sits down he has to pick his leg up sometimes to move it.  But today it seems that it is weaker and he cannot walk because it is difficult to lift.  He has not had any abdominal pain, nausea vomiting or fever.  He has never had evaluation of his back since this has been going on for the last few months.  He has not taken any medication for it.  He does report an issue with his kidneys and he does go regularly to get Epogen infusions but based on his sisters information and she reports that he often times does not take his medication regularly, will skip appointments and does not seem to fully understand his illness.  Patient is complaining of swelling in his lower extremities.  He does have Lasix and reports he is taking it however his sister is doubtful but she does pick up the medications for  him.  The history is provided by the patient, medical records and a relative.  Weakness     Home Medications Prior to Admission medications   Medication Sig Start Date End Date Taking? Authorizing Provider  aspirin EC 81 MG tablet Take 81 mg by mouth daily. Swallow whole.   Yes [provider]  atorvastatin (LIPITOR) 40 MG tablet Take 20 mg by mouth daily.   Yes [provider]  carvedilol (COREG) 25 MG tablet Take 25 mg by mouth 2 (two) times daily with a meal.   Yes [provider]  furosemide (LASIX) 40 MG tablet Take 40 mg by mouth 2 (two) times daily. 03/20/21  Yes [provider]  hydrALAZINE (APRESOLINE) 50 MG tablet Take 50 mg by mouth 3 (three) times daily.   Yes [provider]  HYDROcodone-acetaminophen (NORCO/VICODIN) 5-325 MG tablet Take 1 tablet by mouth every 6 (six) hours as needed for moderate pain. 01/14/21  Yes Cornett, Marcello Moores, MD  losartan (COZAAR) 100 MG tablet Take 50 mg by mouth daily.   Yes [provider]  pregabalin (LYRICA) 150 MG capsule Take 150 mg by mouth 2 (two) times daily.   Yes [provider]  thiamine 100 MG tablet Take 1 tablet (100 mg total) by mouth daily. 10/05/14  Yes Reyne Dumas, MD  acetaminophen (TYLENOL) 325 MG tablet Take 2 tablets (650 mg total) by mouth every 6 (six) hours as needed. Patient not taking: Reported on 04/06/2021 12/24/16   Jill Alexanders, PA-C      Allergies    Fiorinal [butalbital-aspirin-caffeine]    Review of Systems   Review of Systems  Neurological:  Positive for weakness.   Physical Exam Updated Vital Signs BP (!) 191/89    Pulse 70    Temp (!) 97.4 F (36.3 C)    Resp 10    SpO2 100%  Physical Exam Vitals and nursing note reviewed.  Constitutional:      General: He is not in acute distress.    Appearance: He is well-developed.  HENT:     Head: Normocephalic and atraumatic.  Eyes:     Conjunctiva/sclera: Conjunctivae normal.     Pupils:  Pupils are equal, round, and reactive to light.  Cardiovascular:     Rate and Rhythm: Normal rate and regular rhythm.     Heart sounds: No murmur heard. Pulmonary:     Effort: Pulmonary effort is normal. No respiratory distress.     Breath sounds: Normal breath sounds. No wheezing or rales.  Abdominal:     General: There is no distension.     Palpations: Abdomen is soft.     Tenderness: There is no abdominal tenderness. There is no guarding or rebound.  Musculoskeletal:        General: No tenderness. Normal range of motion.     Cervical back: Normal range of motion and neck supple.     Right lower leg: Edema present.     Left lower leg: Edema present.     Comments: 2+ pitting edema in bilateral lower extremities to the mid shin.  Tenderness with palpation to the lower lumbar spine  Skin:    General: Skin is warm and dry.     Findings: No erythema or rash.  Neurological:     Mental Status: He is alert and oriented to person, place, and time.     Motor: Weakness present.     Comments: Decreased sensation subjectively in bilateral lower extremities but sensation intact in the upper portion of the lower extremities.  5 out of 5 strength in the left lower extremity and 4 out of 5 strength in the right lower extremity.  Positive straight leg raise bilaterally  Psychiatric:        Mood and Affect: Mood normal.        Behavior: Behavior normal.    ED Results / Procedures / Treatments   Labs (all labs ordered are listed, but only abnormal results are displayed) Labs Reviewed  BASIC METABOLIC PANEL - Abnormal; Notable for the following components:      Result Value   Glucose, Bld 252 (*)    BUN 55 (*)    Creatinine, Ser 4.38 (*)    GFR, Estimated 14 (*)    All other components within normal limits  CBC - Abnormal; Notable for the following components:   RBC 3.41 (*)    Hemoglobin 10.5 (*)    HCT 31.0 (*)    All other components within normal limits  CBG MONITORING, ED - Abnormal;  Notable for the following components:   Glucose-Capillary 241 (*)    All other components within normal limits  URINALYSIS, ROUTINE W REFLEX MICROSCOPIC    EKG EKG Interpretation  Date/Time:  Sunday April 06 2021 12:32:54 EST Ventricular Rate:  76 PR Interval:  184 QRS Duration: 80 QT Interval:  402 QTC Calculation: 452 R Axis:   94 Text Interpretation: Normal sinus rhythm Rightward axis Anterior infarct , age undetermined T wave abnormality, consider inferior ischemia Abnormal ECG When compared with ECG of 09-Jan-2021 07:23, PREVIOUS ECG IS PRESENT No significant change since last tracing Abnormal ECG Confirmed by Carmin Muskrat [phone]) on 04/06/2021 1:26:20 PM  Radiology No results found.  Procedures Procedures    Medications Ordered in ED Medications  HYDROcodone-acetaminophen (NORCO/VICODIN) 5-325 MG per tablet 1 tablet (1 tablet Oral Given 04/06/21 1629)    ED Course/ Medical Decision Making/ A&P                           Medical Decision Making Amount and/or Complexity of Data Reviewed Independent Historian: caregiver External Data Reviewed: labs and notes. Labs: ordered. Decision-making details documented in ED Course. ECG/medicine tests: ordered and independent interpretation performed. Decision-making details documented in ED Course.   Patient is a 71 year old male with multiple medical problems and known noncompliance who is presenting today with complaint of back pain and difficulty walking.  Patient has a history of significant renal disease and is supposed to be on multiple blood pressure medications and diuretics however the patient reports he is taking them but his sister is doubtful.  He is hypertensive today and does have evidence of some fluid overload.  However based on his lab work which I independently reviewed and interpreted patient has stable renal function at this time.  Creatinine today is 4.38 and BUN is 55 which is unchanged from several months ago.   Patient is hyperglycemic in the 200s but his sister reports he does not take any diabetes medicine anymore.  CBC shows a hemoglobin of 10.5 which is slightly lower than his baseline which has been 12 since he has been getting the Epogen shots but he missed his last few shots.  White count within normal limits.  Patient is denying any infectious symptoms today.  Lower suspicion for UTI or acute infectious etiology.  He has no evidence of rash or symptoms to suggest shingles.  Low suspicion for an acute infectious etiology in his back such as discitis or osteomyelitis.  Suspect degenerative disc disease with bulging disc versus spinal stenosis.  Patient had an MRI of his L-spine ordered a few months ago but it was never performed.  We will do that today and give patient a dose of pain medication.  He was also given a dose of his hydralazine due to his hypertension. 9:26 PM P't s blood pressure is improved after taking his home medication.  He has urinated over a liter of urine after receiving Lasix.  He feels better after having hydrocodone.  Patient is able to stand and walk here at this time.  Still suspect acute back pathology such as bulging disc and radiculopathy however patient does not desire to wait for the MRI.  Given patient is now ambulatory feel that it is reasonable for him to get the MRI as an outpatient.  Discussed with this with the patient and his sister who is at bedside.  Patient desires to go home and wants to call his doctor for a follow-up.  This seems reasonable at this time.  Encouraged patient to take his home medications as that would help with his lower extremity edema and blood pressure as well.  Patient does not appear to have any social determinants preventing him  from going home at this time.  He does have a Rollator and cane to use as needed.  He was given return precautions.        Final Clinical Impression(s) / ED Diagnoses Final diagnoses:  Chronic bilateral low back  pain with right-sided sciatica  Chronic kidney disease, unspecified CKD stage    Rx / DC Orders ED Discharge Orders          Ordered    HYDROcodone-acetaminophen (NORCO/VICODIN) 5-325 MG tablet  Every 6 hours PRN        04/06/21 2120              Blanchie Dessert, MD 04/06/21 2129

## 2021-04-06 NOTE — Discharge Instructions (Signed)
Make sure you are taking your Lasix 2 times a day to help with the swelling in your legs.  Also make sure you are taking your blood pressure medication.  You were given a prescription for pain medication to help with the back pain but it will be important to follow-up with your doctor to get an outpatient MRI.  Everything with your kidneys today looks about the same.  No need to get dialysis right now.

## 2021-04-10 ENCOUNTER — Encounter (HOSPITAL_COMMUNITY): Payer: Self-pay

## 2021-04-10 ENCOUNTER — Encounter (HOSPITAL_COMMUNITY)
Admission: RE | Admit: 2021-04-10 | Discharge: 2021-04-10 | Disposition: A | Discharge status: Home / Self Care | Payer: Medicare Other | Source: Ambulatory Visit | Attending: Nephrology | Admitting: Nephrology

## 2021-04-10 VITALS — BP 179/73 | HR 59 | Temp 97.1°F | Resp 18

## 2021-04-10 DIAGNOSIS — N184 Chronic kidney disease, stage 4 (severe): Secondary | ICD-10-CM | POA: Insufficient documentation

## 2021-04-10 DIAGNOSIS — D631 Anemia in chronic kidney disease: Secondary | ICD-10-CM | POA: Insufficient documentation

## 2021-04-10 LAB — POCT HEMOGLOBIN-HEMACUE: Hemoglobin: 10.4 g/dL — ABNORMAL LOW (ref 13.0–17.0)

## 2021-04-10 MED ORDER — EPOETIN ALFA-EPBX 10000 UNIT/ML IJ SOLN
INTRAMUSCULAR | Status: AC
  Filled 2021-04-10: qty 1

## 2021-04-10 MED ORDER — EPOETIN ALFA-EPBX 10000 UNIT/ML IJ SOLN
10000.0000 [IU] | INTRAMUSCULAR | Status: DC
  Administered 2021-04-10: 10000 [IU] via SUBCUTANEOUS

## 2021-04-11 ENCOUNTER — Encounter (HOSPITAL_COMMUNITY): Payer: Self-pay

## 2021-04-18 ENCOUNTER — Encounter (HOSPITAL_COMMUNITY): Payer: Self-pay | Admitting: Emergency Medicine

## 2021-04-18 ENCOUNTER — Other Ambulatory Visit: Payer: Self-pay

## 2021-04-18 ENCOUNTER — Emergency Department (HOSPITAL_COMMUNITY)
Admission: EM | Admit: 2021-04-18 | Discharge: 2021-04-18 | Disposition: A | Discharge status: Home / Self Care | Payer: No Typology Code available for payment source | Attending: Emergency Medicine | Admitting: Emergency Medicine

## 2021-04-18 ENCOUNTER — Emergency Department (HOSPITAL_COMMUNITY): Payer: No Typology Code available for payment source

## 2021-04-18 DIAGNOSIS — R531 Weakness: Secondary | ICD-10-CM | POA: Insufficient documentation

## 2021-04-18 DIAGNOSIS — N289 Disorder of kidney and ureter, unspecified: Secondary | ICD-10-CM | POA: Diagnosis not present

## 2021-04-18 DIAGNOSIS — Z7952 Long term (current) use of systemic steroids: Secondary | ICD-10-CM | POA: Insufficient documentation

## 2021-04-18 DIAGNOSIS — Z79899 Other long term (current) drug therapy: Secondary | ICD-10-CM | POA: Diagnosis not present

## 2021-04-18 DIAGNOSIS — W19XXXA Unspecified fall, initial encounter: Secondary | ICD-10-CM | POA: Insufficient documentation

## 2021-04-18 DIAGNOSIS — Z7982 Long term (current) use of aspirin: Secondary | ICD-10-CM | POA: Insufficient documentation

## 2021-04-18 LAB — COMPREHENSIVE METABOLIC PANEL
ALT: 15 U/L (ref 0–44)
AST: 23 U/L (ref 15–41)
Albumin: 3.6 g/dL (ref 3.5–5.0)
Alkaline Phosphatase: 61 U/L (ref 38–126)
Anion gap: 12 (ref 5–15)
BUN: 87 mg/dL — ABNORMAL HIGH (ref 8–23)
CO2: 23 mmol/L (ref 22–32)
Calcium: 8.9 mg/dL (ref 8.9–10.3)
Chloride: 99 mmol/L (ref 98–111)
Creatinine, Ser: 5.18 mg/dL — ABNORMAL HIGH (ref 0.61–1.24)
GFR, Estimated: 11 mL/min — ABNORMAL LOW (ref >60)
Glucose, Bld: 225 mg/dL — ABNORMAL HIGH (ref 70–99)
Potassium: 4.1 mmol/L (ref 3.5–5.1)
Sodium: 134 mmol/L — ABNORMAL LOW (ref 135–145)
Total Bilirubin: 1.2 mg/dL (ref 0.3–1.2)
Total Protein: 7.2 g/dL (ref 6.5–8.1)

## 2021-04-18 LAB — CBC WITH DIFFERENTIAL/PLATELET
Abs Immature Granulocytes: 0.03 10*3/uL (ref 0.00–0.07)
Basophils Absolute: 0 10*3/uL (ref 0.0–0.1)
Basophils Relative: 0 %
Eosinophils Absolute: 0 10*3/uL (ref 0.0–0.5)
Eosinophils Relative: 0 %
HCT: 33.3 % — ABNORMAL LOW (ref 39.0–52.0)
Hemoglobin: 11.3 g/dL — ABNORMAL LOW (ref 13.0–17.0)
Immature Granulocytes: 1 %
Lymphocytes Relative: 16 %
Lymphs Abs: 1 10*3/uL (ref 0.7–4.0)
MCH: 30.7 pg (ref 26.0–34.0)
MCHC: 33.9 g/dL (ref 30.0–36.0)
MCV: 90.5 fL (ref 80.0–100.0)
Monocytes Absolute: 0.8 10*3/uL (ref 0.1–1.0)
Monocytes Relative: 12 %
Neutro Abs: 4.4 10*3/uL (ref 1.7–7.7)
Neutrophils Relative %: 71 %
Platelets: 195 10*3/uL (ref 150–400)
RBC: 3.68 MIL/uL — ABNORMAL LOW (ref 4.22–5.81)
RDW: 15.1 % (ref 11.5–15.5)
WBC: 6.2 10*3/uL (ref 4.0–10.5)
nRBC: 0 % (ref 0.0–0.2)

## 2021-04-18 LAB — URINALYSIS, ROUTINE W REFLEX MICROSCOPIC
Bilirubin Urine: NEGATIVE
Glucose, UA: >=500 mg/dL — AB
Ketones, ur: NEGATIVE mg/dL
Nitrite: NEGATIVE
Protein, ur: 100 mg/dL — AB
Specific Gravity, Urine: 1.008 (ref 1.005–1.030)
WBC, UA: >50 WBC/hpf — ABNORMAL HIGH (ref 0–5)
pH: 5 (ref 5.0–8.0)

## 2021-04-18 LAB — ETHANOL: Alcohol, Ethyl (B): <10 mg/dL (ref <10)

## 2021-04-18 MED ORDER — PREDNISONE 20 MG PO TABS: 40.0000 mg | ORAL_TABLET | Freq: Every day | ORAL | 0 refills | Status: DC

## 2021-04-18 NOTE — ED Triage Notes (Signed)
Per [facility] pt coming from home when he fell while walking the garbage can out. States his wife had to drag him inside since he was not able to walk. States weakness in legs over the past few months that have worsened over the past couple weeks.

## 2021-04-18 NOTE — ED Notes (Signed)
Pt ambulated w/ rolling walker w/ steady gait.

## 2021-04-18 NOTE — ED Notes (Signed)
Patient transported to MRI 

## 2021-04-18 NOTE — ED Provider Notes (Signed)
[place] DEPT Provider Note   CSN: [id number] Arrival date & time: 04/18/21  1007     History  Chief Complaint  Patient presents with   Weakness   Fall    Tyler Patterson is a 71 y.o. male.  HPI Presents with concern of ongoing bilateral lower extremity weakness and after a fall that occurred today.  Patient currently has no new pain, notes that he has ongoing low back pain has had it for months.  He has been seen, evaluated, primary care, and in the ED, including last week.  He notes that he is having ongoing difficulty with ambulation, moving each leg, right greater than left.  He has not been seen or evaluated by an orthopedist recently, however. Noted complete lack of motion, or sensation in his lower extremities, no new incontinence, no new upper extremity weakness. No fever, chills, nausea, vomiting.  Today, with difficulty ambulating he had a minor fall.    Home Medications Prior to Admission medications   Medication Sig Start Date End Date Taking? Authorizing Provider  aspirin EC 81 MG tablet Take 81 mg by mouth daily. Swallow whole.   Yes [provider]  atorvastatin (LIPITOR) 40 MG tablet Take 20 mg by mouth daily.   Yes [provider]  carvedilol (COREG) 25 MG tablet Take 25 mg by mouth 2 (two) times daily with a meal.   Yes [provider]  Cholecalciferol (VITAMIN D3) 50 MCG (2000 UT) CAPS Take 1 capsule by mouth daily.   Yes [provider]  furosemide (LASIX) 40 MG tablet Take 40 mg by mouth 2 (two) times daily. 03/20/21  Yes [provider]  hydrALAZINE (APRESOLINE) 50 MG tablet Take 50 mg by mouth 3 (three) times daily.   Yes [provider]  HYDROcodone-acetaminophen (NORCO/VICODIN) 5-325 MG tablet Take 1 tablet by mouth every 6 (six) hours as needed. 04/06/21  Yes Plunkett, Loree Fee, MD  losartan (COZAAR) 100 MG tablet Take 100 mg by mouth daily.   Yes [provider]   predniSONE (DELTASONE) 5 MG tablet Take 5 mg by mouth See admin instructions. (1 tab 3 times a day x2 days, 1 tab twice a day x 5 days, 1 tab every day till finished   Yes [provider]  pregabalin (LYRICA) 150 MG capsule Take 150 mg by mouth 2 (two) times daily.   Yes [provider]  thiamine 100 MG tablet Take 1 tablet (100 mg total) by mouth daily. 10/05/14  Yes Reyne Dumas, MD  traMADol (ULTRAM) 50 MG tablet Take 50 mg by mouth See admin instructions. (1 tablet every 6 hours, for 10 days)   Yes [provider]  acetaminophen (TYLENOL) 325 MG tablet Take 2 tablets (650 mg total) by mouth every 6 (six) hours as needed. Patient not taking: Reported on 04/06/2021 12/24/16   Jill Alexanders, PA-C      Allergies    Fiorinal [butalbital-aspirin-caffeine]    Review of Systems   Review of Systems  Constitutional:        Per HPI, otherwise negative  HENT:         Per HPI, otherwise negative  Respiratory:         Per HPI, otherwise negative  Cardiovascular:        Per HPI, otherwise negative  Gastrointestinal:  Negative for vomiting.  Endocrine:       Negative aside from HPI  Genitourinary:        Neg  aside from HPI   Musculoskeletal:        Per HPI, otherwise negative  Skin: Negative.   Neurological:  Negative for syncope.   Physical Exam Updated Vital Signs BP (!) 172/79    Pulse 64    Temp 97.6 F (36.4 C) (Oral)    Resp 13    Ht 5\' 8"  (1.727 m)    Wt 85.3 kg    SpO2 98%    BMI 28.59 kg/m  Physical Exam Vitals and nursing note reviewed.  Constitutional:      General: He is not in acute distress.    Appearance: He is well-developed.  HENT:     Head: Normocephalic and atraumatic.  Eyes:     Conjunctiva/sclera: Conjunctivae normal.     Pupils: Pupils are equal, round, and reactive to light.  Cardiovascular:     Rate and Rhythm: Normal rate and regular rhythm.     Heart sounds: No murmur heard. Pulmonary:     Effort: Pulmonary effort is  normal. No respiratory distress.     Breath sounds: Normal breath sounds. No wheezing or rales.  Abdominal:     General: There is no distension.     Palpations: Abdomen is soft.     Tenderness: There is no abdominal tenderness. There is no guarding or rebound.  Musculoskeletal:        General: No tenderness. Normal range of motion.     Cervical back: Normal range of motion and neck supple.     Right lower leg: Edema present.     Left lower leg: Edema present.     Comments: 1+ pitting edema in bilateral lower extremities to the mid shin.   Skin:    General: Skin is warm and dry.     Findings: No erythema or rash.  Neurological:     Mental Status: He is alert and oriented to person, place, and time.     Motor: Weakness present.     Comments: Decreased sensation subjectively in bilateral lower extremities but sensation intact in the upper portion of the lower extremities.  5 out of 5 strength in the left lower extremity and 4 out of 5 strength in the right lower extremity. With time allowed for motion the patient can flex each hip, knee, spontaneously, to command, follows these commands appropriately.  Psychiatric:        Mood and Affect: Mood normal.        Behavior: Behavior normal.    ED Results / Procedures / Treatments   Labs (all labs ordered are listed, but only abnormal results are displayed) Labs Reviewed  COMPREHENSIVE METABOLIC PANEL - Abnormal; Notable for the following components:      Result Value   Sodium 134 (*)    Glucose, Bld 225 (*)    BUN 87 (*)    Creatinine, Ser 5.18 (*)    GFR, Estimated 11 (*)    All other components within normal limits  CBC WITH DIFFERENTIAL/PLATELET - Abnormal; Notable for the following components:   RBC 3.68 (*)    Hemoglobin 11.3 (*)    HCT 33.3 (*)    All other components within normal limits  URINALYSIS, ROUTINE W REFLEX MICROSCOPIC - Abnormal; Notable for the following components:   APPearance CLOUDY (*)    Glucose, UA >=500  (*)    Hgb urine dipstick SMALL (*)    Protein, ur 100 (*)    Leukocytes,Ua LARGE (*)    WBC, UA >50 (*)  Bacteria, UA RARE (*)    All other components within normal limits  ETHANOL    EKG EKG Interpretation  Date/Time:  Friday April 18 2021 09:37:31 EST Ventricular Rate:  65 PR Interval:  191 QRS Duration: 88 QT Interval:  414 QTC Calculation: 431 R Axis:   80 Text Interpretation: Sinus rhythm Consider left ventricular hypertrophy T wave abnormality Abnormal ECG Confirmed by Carmin Muskrat [phone]) on 04/18/2021 1:03:12 PM  Radiology MR Brain Wo Contrast (neuro protocol)  Result Date: 04/18/2021 CLINICAL DATA:  Acute neuro deficit.  Ongoing leg weakness. EXAM: MRI HEAD WITHOUT CONTRAST TECHNIQUE: Multiplanar, multiecho pulse sequences of the brain and surrounding structures were obtained without intravenous contrast. COMPARISON:  MRI head 01/14/2021 FINDINGS: Brain: Extensive chronic white matter changes. Patchy hyperintensity throughout the cerebral white matter. Patchy hyperintensity also present in the basal ganglia bilaterally and in the pons. Chronic infarct right cerebellum. Interval development of 1 cm cyst in the right parietal white matter. There appears to be restricted diffusion in the periphery of the cyst. This was not present previously. Probable subacute infarct. 8 mm extra-axial lesion right temporoparietal lobe most likely meningioma. No change from the prior study. Mild atrophy.  Ventricle size normal. Chronic hemorrhage in the left temporoparietal lobe and left occipital lobe unchanged from prior study. Additional areas of chronic microhemorrhage in the brain bilaterally. Vascular: Normal arterial flow voids in the skull base. Skull and upper cervical spine: No focal skeletal lesion. Sinuses/Orbits: Mucosal edema paranasal sinuses. Bilateral cataract extraction Other: None IMPRESSION: Extensive chronic ischemic changes. Interval development of 1 cm cystic lesion right  parietal white matter with peripheral strict diffusion likely subacute infarct. 8 mm extra-axial lesion right temporoparietal lobe likely meningioma. Postcontrast MRI brain recommended for further evaluation. Electronically Signed   By: Franchot Gallo M.D.   On: 04/18/2021 12:22    Procedures Procedures    Medications Ordered in ED Medications - No data to display  ED Course/ Medical Decision Making/ A&P Adult male with lower extremity weakness presents after a fall.  Differential including stroke, worsening radiculopathy, cauda equina, infection, progression of CKD considered, labs ordered, MRI ordered.  Chart review performed, notable for hernia repair performed 3 months ago without complication, MRI brain performed 3 months ago with multiple areas of infarct.  2:22 PM Patient awake, alert, ambulates with a walker as he does at home, states that he feels better than he has recently, while walking currently.  I have reviewed, interpreted MRI, labs, discussed with him.  Patient amenable to follow-up with his orthopedist with whom conversations about his back pain, radiculopathy are occurring as recently as last week, and with his primary care physician and or the [employer] given his worsening renal function.  He denies any urinary complaints.                        Medical Decision Making Adult male with multiple medical issues including CKD, sciatica, prior stroke presents with concern for ongoing leg weakness, difficulty with ambulation.  He is otherwise neurologically unremarkable, and physical exam consistent without of about 10 days ago after similar presentation.  MRI performed here, interpreted, notable for evidence for prior stroke, nothing acute, consistent with physical exam.  Labs performed here, interpreted, notable for progression of his kidney disease.  Patient received fluid resuscitation to assist as much as possible in this regard. Patient also found to have abnormal  urinalysis, but has no urinary complaints, is afebrile, not hypotensive, low  suspicion for urinary tract infection.  With his substantial improvement here, demonstrated ambulatory capacity, absence of distress patient appropriate for close outpatient follow-up to which he is amenable.  Amount and/or Complexity of Data Reviewed Independent Historian: EMS External Data Reviewed: notes.    Details: Ortho evaluation within the past 10 days for lumbosacral radiculopathy, MRI performed last year with prior stroke. Labs: ordered. Decision-making details documented in ED Course. Radiology: ordered. Decision-making details documented in ED Course. ECG/medicine tests:  Decision-making details documented in ED Course.  Risk Prescription drug management. Decision regarding hospitalization.  Critical Care Total time providing critical care: < 30 minutes       Final Clinical Impression(s) / ED Diagnoses Final diagnoses:  Weakness  Renal dysfunction    Rx / DC Orders ED Discharge Orders          Ordered    predniSONE (DELTASONE) 20 MG tablet  Daily with breakfast        04/18/21 1425              Carmin Muskrat, MD 04/18/21 1425

## 2021-04-18 NOTE — Discharge Instructions (Signed)
As discussed, your evaluation today has been largely reassuring.  But, it is important that you monitor your condition carefully, and do not hesitate to return to the ED if you develop new, or concerning changes in your condition.  Otherwise, please follow-up with your physicians for appropriate ongoing care.  In particular, with your history of prior strokes, kidney disease and leg weakness it is reported that you follow-up with your primary care physician, as well as with a kidney specialist and your orthopedist.

## 2021-04-22 NOTE — Progress Notes (Signed)
Follow-up Visit   Date: 04/23/21   Tyler Patterson MRN: [id number] DOB: [birth year]   Interim History: Tyler Patterson is a 71 y.o. right-handed African American male with tobacco use, diabetes mellitus, alcohol abuse, and GERD returning to the clinic for follow-up of gait instability.  The patient was accompanied to the clinic by sister who also provides collateral information.    History of present illness: Starting around 2018, he began noticing worsening balance when walking and veering side-to-side.  Symptoms are constant. He began using a cane about 5 years ago.  He has fallen twice this year and attributes this to his legs giving out. He endorses low back pain and leg cramps.  He is very sedentary and does not walk, so difficult to determine if he has exertional leg pain or heaviness.  He has numbness and tingling over the feet and lower legs for the past 10 years.  He takes Lyrica 150mg  twice daily.   He lives at home with wife in a 2 level home.  He drinks 10 beers over the weekend for the past year.  Prior to 2021, he was drinking 12-15 beers daily for the past 20 years.   UPDATE 04/23/2020:   He is here for follow-up. At his last visit, I ordered MRI lumbar spine and referred him to PT, none which were completed. Patient claims he was not contacted, but I do not think he has good follow through with recommendations. Sister said that he had hernia repair in October, and following surgery he was unable to walk. He was taken to the ER where he was felt that anesthesia may needed longer time to wear off. He suffered a fall in the drive-way while taking garbage cans out on 1/20. He went to the ER where MRI brain shows chronic white matter changes and interval subacute right parietal stroke.  Patient denies left sided paresthesias/weakness, speech changes.  He takes daily aspirin 81mg , lipitor 40mg , blood pressure medication and insulin.   Medications:  Current Outpatient Medications on File  Prior to Visit  Medication Sig Dispense Refill   aspirin EC 81 MG tablet Take 81 mg by mouth daily. Swallow whole.     atorvastatin (LIPITOR) 40 MG tablet Take 20 mg by mouth daily.     carvedilol (COREG) 25 MG tablet Take 25 mg by mouth 2 (two) times daily with a meal.     Cholecalciferol (VITAMIN D3) 50 MCG (2000 UT) CAPS Take 1 capsule by mouth daily.     furosemide (LASIX) 40 MG tablet Take 40 mg by mouth 2 (two) times daily.     hydrALAZINE (APRESOLINE) 50 MG tablet Take 50 mg by mouth 3 (three) times daily.     HYDROcodone-acetaminophen (NORCO/VICODIN) 5-325 MG tablet Take 1 tablet by mouth every 6 (six) hours as needed. 10 tablet 0   losartan (COZAAR) 100 MG tablet Take 100 mg by mouth daily.     predniSONE (DELTASONE) 20 MG tablet Take 2 tablets (40 mg total) by mouth daily with breakfast. For the next four days 8 tablet 0   pregabalin (LYRICA) 150 MG capsule Take 150 mg by mouth 2 (two) times daily.     thiamine 100 MG tablet Take 1 tablet (100 mg total) by mouth daily. 30 tablet 2   traMADol (ULTRAM) 50 MG tablet Take 50 mg by mouth See admin instructions. (1 tablet every 6 hours, for 10 days)     No current facility-administered medications on file prior  to visit.    Allergies:  Allergies  Allergen Reactions   Fiorinal [Butalbital-Aspirin-Caffeine] Swelling    Vital Signs:  BP 122/70    Pulse (!) 51    Ht 5\' 8"  (1.727 m)    Wt 185 lb (83.9 kg)    SpO2 97%    BMI 28.13 kg/m   Neurological Exam: MENTAL STATUS including orientation to time, place, person, recent and remote memory, attention span and concentration, language, and fund of knowledge is normal.  Speech is not dysarthric.  CRANIAL NERVES:  No visual field defects.  Pupils equal round and reactive to light.  Normal conjugate, extra-ocular eye movements in all directions of gaze.  No ptosis.  Face is symmetric. Palate elevates symmetrically.  Tongue is midline.  MOTOR:  Motor strength is 5/5 in all extremities,  except 4/5 bilateral hip flexors.  No atrophy, fasciculations or abnormal movements.  No pronator drift.  Tone is normal.    MSRs:                                           Right        Left brachioradialis 2+  2+  biceps 2+  2+  triceps 2+  2+  patellar 3+  3+  ankle jerk 0  0   SENSORY:  Absent vibration below the ankles bilaterally.  COORDINATION/GAIT:  Normal finger-to- nose-finger.  Intact rapid alternating movements bilaterally.  Gait assisted with cane, appears wide-based, unsteady.  He arrived in transport chair.   Data: MRI brain 04/18/2021: Extensive chronic ischemic changes. Interval development of 1 cm cystic lesion right parietal white matter with peripheral strict diffusion likely subacute infarct. 8 mm extra-axial lesion right temporoparietal lobe likely meningioma. Postcontrast MRI brain recommended for further evaluation.  Labs 12/31/2020:  Vitamin B1 531, SPEP with IFE no M protein, TSH 0.8, HbA1c 8.0,   IMPRESSION/PLAN: Bilateral leg weakness and gait unsteadiness most likely due to lumbar canal stenosis. MRI lumbar spine and PT was ordered at last visit, but patient did not follow through.  He is now seeing Dr. Emiliano Dyer at [facility], who is managing his low back pain and they have follow-up on Friday to discuss MRI lumbar spine as the next step, which I agree with. Peripheral neuropathy due to poorly controlled diabetes mellitus and alcohol abuse.  Encouraged medication compliance Asymptomatic right parietal ischemic stroke, most likely due to small vessel disease.  Risk factors:  DM, HTN, HPL, tobacco se  - Check US carotids  - Continue ASA 81mg , lipitor 40mg , BP and DM medication per PCP  - Patient reports tobacco cessation x 3 months, but sister called back after the visit and said he still smokes   Further recommendations pending results.  Thank you for allowing me to participate in patient's care.  If I can answer any additional questions, I would be  pleased to do so.    Sincerely,    Naama Sappington K. Posey Pronto, DO

## 2021-04-23 ENCOUNTER — Other Ambulatory Visit: Payer: Self-pay

## 2021-04-23 ENCOUNTER — Encounter: Payer: Self-pay | Admitting: Neurology

## 2021-04-23 ENCOUNTER — Telehealth: Payer: Self-pay | Admitting: Neurology

## 2021-04-23 ENCOUNTER — Ambulatory Visit (INDEPENDENT_AMBULATORY_CARE_PROVIDER_SITE_OTHER): Payer: Medicare Other | Admitting: Neurology

## 2021-04-23 VITALS — BP 122/70 | HR 51 | Ht 68.0 in | Wt 185.0 lb

## 2021-04-23 DIAGNOSIS — I639 Cerebral infarction, unspecified: Secondary | ICD-10-CM

## 2021-04-23 DIAGNOSIS — G621 Alcoholic polyneuropathy: Secondary | ICD-10-CM | POA: Diagnosis not present

## 2021-04-23 DIAGNOSIS — M48061 Spinal stenosis, lumbar region without neurogenic claudication: Secondary | ICD-10-CM

## 2021-04-23 NOTE — Telephone Encounter (Signed)
Pt's sister called in stating the patient told Dr. Posey Pronto he had stopped smoking 3 months ago, but she says that is not true. He is still smoking. He also doesn't take his insulin due to forgetting how to take it. She is going to have his PCP send a referral to an endocrinologist.

## 2021-04-23 NOTE — Telephone Encounter (Signed)
Noted  

## 2021-04-24 ENCOUNTER — Encounter (HOSPITAL_COMMUNITY)
Admission: RE | Admit: 2021-04-24 | Discharge: 2021-04-24 | Disposition: A | Discharge status: Home / Self Care | Payer: Medicare Other | Source: Ambulatory Visit | Attending: Nephrology | Admitting: Nephrology

## 2021-04-24 VITALS — BP 165/95 | HR 54 | Temp 97.3°F | Resp 18

## 2021-04-24 DIAGNOSIS — N184 Chronic kidney disease, stage 4 (severe): Secondary | ICD-10-CM | POA: Diagnosis not present

## 2021-04-24 DIAGNOSIS — D631 Anemia in chronic kidney disease: Secondary | ICD-10-CM

## 2021-04-24 LAB — IRON AND TIBC
Iron: 79 ug/dL (ref 45–182)
Saturation Ratios: 34 % (ref 17.9–39.5)
TIBC: 235 ug/dL — ABNORMAL LOW (ref 250–450)
UIBC: 156 ug/dL

## 2021-04-24 LAB — FERRITIN: Ferritin: 360 ng/mL — ABNORMAL HIGH (ref 24–336)

## 2021-04-24 LAB — POCT HEMOGLOBIN-HEMACUE: Hemoglobin: 12.2 g/dL — ABNORMAL LOW (ref 13.0–17.0)

## 2021-04-24 MED ORDER — EPOETIN ALFA-EPBX 10000 UNIT/ML IJ SOLN: 10000.0000 [IU] | INTRAMUSCULAR | Status: DC

## 2021-04-24 MED ORDER — EPOETIN ALFA-EPBX 10000 UNIT/ML IJ SOLN
INTRAMUSCULAR | Status: AC
  Filled 2021-04-24: qty 1

## 2021-05-07 ENCOUNTER — Ambulatory Visit (HOSPITAL_COMMUNITY): Payer: Medicare Other | Attending: Neurology

## 2021-05-07 ENCOUNTER — Encounter (HOSPITAL_COMMUNITY): Payer: No Typology Code available for payment source

## 2021-05-08 ENCOUNTER — Telehealth: Payer: Self-pay | Admitting: Internal Medicine

## 2021-05-08 ENCOUNTER — Encounter (HOSPITAL_COMMUNITY): Payer: No Typology Code available for payment source

## 2021-05-08 NOTE — Telephone Encounter (Signed)
Called the pt to get her scheduled for her referral appt w/Dr. Kelton Pillar.

## 2021-05-09 NOTE — Telephone Encounter (Signed)
Pts sister Doroteo Bradford) called and has scheduled the appointment

## 2021-05-12 ENCOUNTER — Other Ambulatory Visit: Payer: Self-pay

## 2021-05-12 DIAGNOSIS — N186 End stage renal disease: Secondary | ICD-10-CM

## 2021-05-12 DIAGNOSIS — Z992 Dependence on renal dialysis: Secondary | ICD-10-CM

## 2021-05-19 ENCOUNTER — Ambulatory Visit (HOSPITAL_COMMUNITY)
Admission: RE | Admit: 2021-05-19 | Discharge: 2021-05-19 | Disposition: A | Discharge status: Home / Self Care | Payer: Medicare Other | Source: Ambulatory Visit | Attending: Neurology | Admitting: Neurology

## 2021-05-19 ENCOUNTER — Other Ambulatory Visit: Payer: Self-pay

## 2021-05-19 DIAGNOSIS — I639 Cerebral infarction, unspecified: Secondary | ICD-10-CM | POA: Insufficient documentation

## 2021-05-19 NOTE — Progress Notes (Signed)
[facility] OF [place]  ASSESSMENT / PLAN: Tyler Patterson is a 71 y.o. right handed male in need of permanent dialysis access. I reviewed options for dialysis in detail with the patient, including hemodialysis and peritoneal dialysis. I counseled the patient to ask their nephrologist about their candidacy for renal transplant. I counseled the patient that dialysis access requires surveillance and periodic maintenance. Plan to proceed with left first stage brachiobasilic arteriovenous fistula creation.   CHIEF COMPLAINT: in need of dialysis access  HISTORY OF PRESENT ILLNESS: Tyler Patterson is a 71 y.o. male who presents to clinic for evaluation of permanent dialysis access.  He is right-handed.  He has never had dialysis access before.  No history of central venous catheterization.  We long discussion about dialysis.  We discussed: different options for dialysis access including peritoneal dialysis and hemodialysis;  The expected treatment course of dialysis;  the need for maintenance of all dialysis access surgery.  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    Past Surgical History:  Procedure Laterality Date   COLON SURGERY     Hemicolectomy for benign colon mass   HERNIA REPAIR     INCISIONAL HERNIA REPAIR N/A 01/14/2021   Procedure: REPAIR OF INCISIONAL HERNIA WITH MESH;  Surgeon: Erroll Luna, MD;  Location: [facility];  Service: General;  Laterality: N/A;    Family History  Problem Relation Age of Onset   Lung cancer Mother    Lung cancer Father    Diabetes Mellitus II Neg Hx     Social History   Socioeconomic History   Marital status: Married    Spouse name: Not on file    Number of children: 0   Years of education: Not on file   Highest education level: Not on file  Occupational History   Not on file  Tobacco Use   Smoking status: Former    Packs/day: 0.25    Types: Cigars, Cigarettes   Smokeless tobacco: Never   Tobacco comments:    smokes black & mild cigars 4-5/day  Vaping Use   Vaping Use: Never used  Substance and Sexual Activity   Alcohol use: Yes    Comment: drinks beer on weekends 6-8   Drug use: No   Sexual activity: Not Currently  Other Topics Concern   Not on file  Social History Narrative   Lives in a two story home    Right Handed    Drinks no caffeine    Social Determinants of Health   Financial Resource Strain: Not on file  Food Insecurity: Not on file  Transportation Needs: Not on file  Physical Activity: Not on file  Stress: Not on file  Social Connections: Not on file  Intimate Partner Violence: Not on file    Allergies  Allergen Reactions   Duloxetine     Other reaction(s): Abdominal pain   Fiorinal [Butalbital-Aspirin-Caffeine] Swelling   Spironolactone     Other reaction(s): Gynecomastia    Current Outpatient Medications  Medication Sig Dispense Refill   aspirin EC 81 MG tablet Take 81 mg by mouth daily. Swallow whole.     atorvastatin (  LIPITOR) 40 MG tablet Take 20 mg by mouth daily.     carvedilol (COREG) 25 MG tablet Take 25 mg by mouth 2 (two) times daily with a meal.     Cholecalciferol (VITAMIN D3) 50 MCG (2000 UT) CAPS Take 1 capsule by mouth daily.     furosemide (LASIX) 40 MG tablet Take 40 mg by mouth 2 (two) times daily.     hydrALAZINE (APRESOLINE) 50 MG tablet Take 50 mg by mouth 3 (three) times daily.     HYDROcodone-acetaminophen (NORCO/VICODIN) 5-325 MG tablet Take 1 tablet by mouth every 6 (six) hours as needed. 10 tablet 0   losartan (COZAAR) 100 MG tablet Take 100 mg by mouth daily.     predniSONE (DELTASONE) 20 MG tablet Take 2 tablets (40 mg total) by mouth daily with breakfast. For the  next four days 8 tablet 0   pregabalin (LYRICA) 150 MG capsule Take 150 mg by mouth 2 (two) times daily.     thiamine 100 MG tablet Take 1 tablet (100 mg total) by mouth daily. 30 tablet 2   traMADol (ULTRAM) 50 MG tablet Take 50 mg by mouth See admin instructions. (1 tablet every 6 hours, for 10 days)     No current facility-administered medications for this visit.    PHYSICAL EXAM Vitals:   05/20/21 0837  BP: 134/68  Pulse: (!) 59  Resp: 20  Temp: 98.8 F (37.1 C)  SpO2: 99%  Weight: 185 lb (83.9 kg)  Height: 5\' 8"  (1.727 m)    Constitutional: well appearing. no distress. Appears well nourished.  Neurologic: CN intact. no focal findings. no sensory loss. In wheelchair. Psychiatric:  Mood and affect symmetric and appropriate. Eyes:  No icterus. No conjunctival pallor. Ears, nose, throat:  mucous membranes moist. Midline trachea.  Cardiac: regular rate and rhythm.  Respiratory:  unlabored. Abdominal:  soft, non-tender, non-distended.  Peripheral vascular: 2+ radial pulses Extremity: no edema. no cyanosis. no pallor.  Skin: no gangrene. no ulceration.  Lymphatic: no Stemmer's sign. no palpable lymphadenopathy.  PERTINENT LABORATORY AND RADIOLOGIC DATA  Most recent CBC CBC Latest Ref Rng & Units 04/24/2021 04/18/2021 04/10/2021  WBC 4.0 - 10.5 K/uL - 6.2 -  Hemoglobin 13.0 - 17.0 g/dL 12.2(L) 11.3(L) 10.4(L)  Hematocrit 39.0 - 52.0 % - 33.3(L) -  Platelets 150 - 400 K/uL - 195 -     Most recent CMP CMP Latest Ref Rng & Units 04/18/2021 04/06/2021 01/09/2021  Glucose 70 - 99 mg/dL 225(H) 252(H) 171(H)  BUN 8 - 23 mg/dL 87(H) 55(H) 65(H)  Creatinine 0.61 - 1.24 mg/dL 5.18(H) 4.38(H) 4.47(H)  Sodium 135 - 145 mmol/L 134(L) 137 136  Potassium 3.5 - 5.1 mmol/L 4.1 4.4 4.4  Chloride 98 - 111 mmol/L 99 102 104  CO2 22 - 32 mmol/L 23 22 20(L)  Calcium 8.9 - 10.3 mg/dL 8.9 9.1 9.1  Total Protein 6.5 - 8.1 g/dL 7.2 - 6.9  Total Bilirubin 0.3 - 1.2 mg/dL 1.2 - 1.4(H)  Alkaline  Phos 38 - 126 U/L 61 - 62  AST 15 - 41 U/L 23 - 16  ALT 0 - 44 U/L 15 - 14    Renal function CrCl cannot be calculated (Patient's most recent lab result is older than the maximum 21 days allowed.).  Hgb A1c MFr Bld (%)  Date Value  12/31/2020 8.0 (H)    LDL Cholesterol  Date Value Ref Range Status  06/26/2010 99 0 - 99 mg/dL Final    Comment:  See lab report for associated comment(s)     Vascular Imaging: Preoperative vein duplex shows suitable left basilic vein for fistula creation.  Yevonne Aline. Stanford Breed, MD [facility] Phone Number: [phone] 05/20/2021 9:03 AM  Total time spent on preparing this encounter including chart review, data review, collecting history, examining the patient, coordinating care for this new patient, 60 minutes.  Portions of this report may have been transcribed using voice recognition software.  Every effort has been made to ensure accuracy; however, inadvertent computerized transcription errors may still be present.

## 2021-05-19 NOTE — H&P (View-Only) (Signed)
[facility] OF [place]  ASSESSMENT / PLAN: Tyler Patterson is a 71 y.o. right handed male in need of permanent dialysis access. I reviewed options for dialysis in detail with the patient, including hemodialysis and peritoneal dialysis. I counseled the patient to ask their nephrologist about their candidacy for renal transplant. I counseled the patient that dialysis access requires surveillance and periodic maintenance. Plan to proceed with left first stage brachiobasilic arteriovenous fistula creation.   CHIEF COMPLAINT: in need of dialysis access  HISTORY OF PRESENT ILLNESS: Tyler Patterson is a 71 y.o. male who presents to clinic for evaluation of permanent dialysis access.  He is right-handed.  He has never had dialysis access before.  No history of central venous catheterization.  We long discussion about dialysis.  We discussed: different options for dialysis access including peritoneal dialysis and hemodialysis;  The expected treatment course of dialysis;  the need for maintenance of all dialysis access surgery.  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    Past Surgical History:  Procedure Laterality Date   COLON SURGERY     Hemicolectomy for benign colon mass   HERNIA REPAIR     INCISIONAL HERNIA REPAIR N/A 01/14/2021   Procedure: REPAIR OF INCISIONAL HERNIA WITH MESH;  Surgeon: Erroll Luna, MD;  Location: [facility];  Service: General;  Laterality: N/A;    Family History  Problem Relation Age of Onset   Lung cancer Mother    Lung cancer Father    Diabetes Mellitus II Neg Hx     Social History   Socioeconomic History   Marital status: Married    Spouse name: Not on file    Number of children: 0   Years of education: Not on file   Highest education level: Not on file  Occupational History   Not on file  Tobacco Use   Smoking status: Former    Packs/day: 0.25    Types: Cigars, Cigarettes   Smokeless tobacco: Never   Tobacco comments:    smokes black & mild cigars 4-5/day  Vaping Use   Vaping Use: Never used  Substance and Sexual Activity   Alcohol use: Yes    Comment: drinks beer on weekends 6-8   Drug use: No   Sexual activity: Not Currently  Other Topics Concern   Not on file  Social History Narrative   Lives in a two story home    Right Handed    Drinks no caffeine    Social Determinants of Health   Financial Resource Strain: Not on file  Food Insecurity: Not on file  Transportation Needs: Not on file  Physical Activity: Not on file  Stress: Not on file  Social Connections: Not on file  Intimate Partner Violence: Not on file    Allergies  Allergen Reactions   Duloxetine     Other reaction(s): Abdominal pain   Fiorinal [Butalbital-Aspirin-Caffeine] Swelling   Spironolactone     Other reaction(s): Gynecomastia    Current Outpatient Medications  Medication Sig Dispense Refill   aspirin EC 81 MG tablet Take 81 mg by mouth daily. Swallow whole.     atorvastatin (  LIPITOR) 40 MG tablet Take 20 mg by mouth daily.     carvedilol (COREG) 25 MG tablet Take 25 mg by mouth 2 (two) times daily with a meal.     Cholecalciferol (VITAMIN D3) 50 MCG (2000 UT) CAPS Take 1 capsule by mouth daily.     furosemide (LASIX) 40 MG tablet Take 40 mg by mouth 2 (two) times daily.     hydrALAZINE (APRESOLINE) 50 MG tablet Take 50 mg by mouth 3 (three) times daily.     HYDROcodone-acetaminophen (NORCO/VICODIN) 5-325 MG tablet Take 1 tablet by mouth every 6 (six) hours as needed. 10 tablet 0   losartan (COZAAR) 100 MG tablet Take 100 mg by mouth daily.     predniSONE (DELTASONE) 20 MG tablet Take 2 tablets (40 mg total) by mouth daily with breakfast. For the  next four days 8 tablet 0   pregabalin (LYRICA) 150 MG capsule Take 150 mg by mouth 2 (two) times daily.     thiamine 100 MG tablet Take 1 tablet (100 mg total) by mouth daily. 30 tablet 2   traMADol (ULTRAM) 50 MG tablet Take 50 mg by mouth See admin instructions. (1 tablet every 6 hours, for 10 days)     No current facility-administered medications for this visit.    PHYSICAL EXAM Vitals:   05/20/21 0837  BP: 134/68  Pulse: (!) 59  Resp: 20  Temp: 98.8 F (37.1 C)  SpO2: 99%  Weight: 185 lb (83.9 kg)  Height: 5\' 8"  (1.727 m)    Constitutional: well appearing. no distress. Appears well nourished.  Neurologic: CN intact. no focal findings. no sensory loss. In wheelchair. Psychiatric:  Mood and affect symmetric and appropriate. Eyes:  No icterus. No conjunctival pallor. Ears, nose, throat:  mucous membranes moist. Midline trachea.  Cardiac: regular rate and rhythm.  Respiratory:  unlabored. Abdominal:  soft, non-tender, non-distended.  Peripheral vascular: 2+ radial pulses Extremity: no edema. no cyanosis. no pallor.  Skin: no gangrene. no ulceration.  Lymphatic: no Stemmer's sign. no palpable lymphadenopathy.  PERTINENT LABORATORY AND RADIOLOGIC DATA  Most recent CBC CBC Latest Ref Rng & Units 04/24/2021 04/18/2021 04/10/2021  WBC 4.0 - 10.5 K/uL - 6.2 -  Hemoglobin 13.0 - 17.0 g/dL 12.2(L) 11.3(L) 10.4(L)  Hematocrit 39.0 - 52.0 % - 33.3(L) -  Platelets 150 - 400 K/uL - 195 -     Most recent CMP CMP Latest Ref Rng & Units 04/18/2021 04/06/2021 01/09/2021  Glucose 70 - 99 mg/dL 225(H) 252(H) 171(H)  BUN 8 - 23 mg/dL 87(H) 55(H) 65(H)  Creatinine 0.61 - 1.24 mg/dL 5.18(H) 4.38(H) 4.47(H)  Sodium 135 - 145 mmol/L 134(L) 137 136  Potassium 3.5 - 5.1 mmol/L 4.1 4.4 4.4  Chloride 98 - 111 mmol/L 99 102 104  CO2 22 - 32 mmol/L 23 22 20(L)  Calcium 8.9 - 10.3 mg/dL 8.9 9.1 9.1  Total Protein 6.5 - 8.1 g/dL 7.2 - 6.9  Total Bilirubin 0.3 - 1.2 mg/dL 1.2 - 1.4(H)  Alkaline  Phos 38 - 126 U/L 61 - 62  AST 15 - 41 U/L 23 - 16  ALT 0 - 44 U/L 15 - 14    Renal function CrCl cannot be calculated (Patient's most recent lab result is older than the maximum 21 days allowed.).  Hgb A1c MFr Bld (%)  Date Value  12/31/2020 8.0 (H)    LDL Cholesterol  Date Value Ref Range Status  06/26/2010 99 0 - 99 mg/dL Final    Comment:  See lab report for associated comment(s)     Vascular Imaging: Preoperative vein duplex shows suitable left basilic vein for fistula creation.  Yevonne Aline. Stanford Breed, MD [facility] Phone Number: [phone] 05/20/2021 9:03 AM  Total time spent on preparing this encounter including chart review, data review, collecting history, examining the patient, coordinating care for this new patient, 60 minutes.  Portions of this report may have been transcribed using voice recognition software.  Every effort has been made to ensure accuracy; however, inadvertent computerized transcription errors may still be present.

## 2021-05-20 ENCOUNTER — Ambulatory Visit (INDEPENDENT_AMBULATORY_CARE_PROVIDER_SITE_OTHER): Payer: Medicare Other | Admitting: Vascular Surgery

## 2021-05-20 ENCOUNTER — Ambulatory Visit (HOSPITAL_COMMUNITY)
Admission: RE | Admit: 2021-05-20 | Discharge: 2021-05-20 | Disposition: A | Discharge status: Home / Self Care | Payer: Medicare Other | Source: Ambulatory Visit | Attending: Vascular Surgery | Admitting: Vascular Surgery

## 2021-05-20 ENCOUNTER — Ambulatory Visit (INDEPENDENT_AMBULATORY_CARE_PROVIDER_SITE_OTHER)
Admission: RE | Admit: 2021-05-20 | Discharge: 2021-05-20 | Disposition: A | Discharge status: Home / Self Care | Payer: Medicare Other | Source: Ambulatory Visit | Attending: Vascular Surgery | Admitting: Vascular Surgery

## 2021-05-20 ENCOUNTER — Other Ambulatory Visit: Payer: Self-pay

## 2021-05-20 ENCOUNTER — Encounter: Payer: Self-pay | Admitting: Vascular Surgery

## 2021-05-20 VITALS — BP 134/68 | HR 59 | Temp 98.8°F | Resp 20 | Ht 68.0 in | Wt 185.0 lb

## 2021-05-20 DIAGNOSIS — Z992 Dependence on renal dialysis: Secondary | ICD-10-CM | POA: Diagnosis not present

## 2021-05-20 DIAGNOSIS — N186 End stage renal disease: Secondary | ICD-10-CM | POA: Insufficient documentation

## 2021-05-20 DIAGNOSIS — N185 Chronic kidney disease, stage 5: Secondary | ICD-10-CM | POA: Diagnosis not present

## 2021-05-21 ENCOUNTER — Encounter (HOSPITAL_COMMUNITY): Payer: Self-pay

## 2021-05-22 ENCOUNTER — Encounter (HOSPITAL_COMMUNITY): Payer: No Typology Code available for payment source

## 2021-05-26 ENCOUNTER — Encounter (HOSPITAL_COMMUNITY)
Admission: RE | Admit: 2021-05-26 | Discharge: 2021-05-26 | Disposition: A | Discharge status: Home / Self Care | Payer: Medicare Other | Source: Ambulatory Visit | Attending: Nephrology | Admitting: Nephrology

## 2021-05-26 ENCOUNTER — Other Ambulatory Visit: Payer: Self-pay

## 2021-05-26 ENCOUNTER — Encounter (HOSPITAL_COMMUNITY): Payer: Self-pay | Admitting: Vascular Surgery

## 2021-05-26 VITALS — BP 107/55 | HR 59 | Temp 97.6°F | Resp 20

## 2021-05-26 DIAGNOSIS — D631 Anemia in chronic kidney disease: Secondary | ICD-10-CM | POA: Diagnosis present

## 2021-05-26 DIAGNOSIS — N184 Chronic kidney disease, stage 4 (severe): Secondary | ICD-10-CM | POA: Diagnosis not present

## 2021-05-26 LAB — IRON AND TIBC
Iron: 102 ug/dL (ref 45–182)
Saturation Ratios: 40 % — ABNORMAL HIGH (ref 17.9–39.5)
TIBC: 253 ug/dL (ref 250–450)
UIBC: 151 ug/dL

## 2021-05-26 LAB — POCT HEMOGLOBIN-HEMACUE: Hemoglobin: 9.7 g/dL — ABNORMAL LOW (ref 13.0–17.0)

## 2021-05-26 LAB — FERRITIN: Ferritin: 607 ng/mL — ABNORMAL HIGH (ref 24–336)

## 2021-05-26 MED ORDER — EPOETIN ALFA-EPBX 10000 UNIT/ML IJ SOLN
10000.0000 [IU] | INTRAMUSCULAR | Status: DC
  Administered 2021-05-26: 10000 [IU] via SUBCUTANEOUS

## 2021-05-26 MED ORDER — EPOETIN ALFA-EPBX 10000 UNIT/ML IJ SOLN
INTRAMUSCULAR | Status: AC
  Filled 2021-05-26: qty 1

## 2021-05-26 NOTE — Progress Notes (Addendum)
I spoke to Tyler Patterson, Mr Tyler Patterson's sister. Tyler Patterson does not live with [name], patient lives with his spouse. Tyler Patterson is the person that drives patient to office visits.  [name] has had strokes and has some memory issues, per Tyler Patterson. Tyler Patterson moved from [place] to help with [name], who has had strokes and has been falling.  [name] has type II diabetes, he  is not taking medication or checking his blood sugar per [facility].  [name] has an appointment with Endocrinologist at [facility] on 06/13/21.  [name] received an Iron infusion today, I called and spoke to the nurse that worked with patient, Tyler Patterson. Tyler Patterson said that [name] told her that the medication had not changed since last visit.  I called [name]'s cell number and spoke to the patient. [name] read his medication to me. Patient said that he has been out of Insulin for a while. [name] reports that he has a Glucose reader and it has been saying high for a month. [name] reported that Dr. Mellody Patterson orders  medication and the Glucose reader. I called Dr. Mellody Patterson office and gave this information, I was told by a CMA, Tyler Patterson stated that [name]  did  come to the appointment or call to reschedule the appt. I was informed that patient is suppose to be on 2 Insulins and an oral medication for medication. Tyler Patterson said she will inform  Dr. Mellody Patterson.  [name] is unable to walk without assistance. Patient is unable to get into the shower. [name] has lumbar stenosis and he is having a lot of pain, plus unable to walk alone. [name] saw Dr. Posey Pronto  neurologist. [name] sister Tyler Patterson reported that patient had an inguinal hernia repair in October 2022 and has not bee able to walk since that time.  I instructed Mr. Mcguiness to wash up well with antibiotic soap if available. Dry off with a clean towel. Do not put lotion, powder, cologne or deodorant or makeup.No jewelry or piercings. Men may shave their face and neck. Woman should not  shave. No nail polish, artificial or acrylic nails. Wear clean clothes, brush your teeth. Glasses, contact lens,dentures or partials may not be worn in the OR. If you need to wear them, please bring a case for glasses, do not wear contacts or bring a case, the hospital does not have contact cases, dentures or partials will have to be removed , make sure they are clean, we will provide a denture cup to put them in. You will need some one to drive you home and a responsible person over the age of 8 to stay with you for the first 24 hours after surgery.   I asked anesthesia PA-C to review.

## 2021-05-27 NOTE — Anesthesia Preprocedure Evaluation (Addendum)
Anesthesia Evaluation  Patient identified by MRN, date of birth, ID band Patient awake    Reviewed: Allergy & Precautions, H&P , NPO status , Patient's Chart, lab work & pertinent test results  Airway Mallampati: II   Neck ROM: full    Dental   Pulmonary COPD, Patient abstained from smoking., former smoker,    breath sounds clear to auscultation       Cardiovascular hypertension,  Rhythm:regular Rate:Normal     Neuro/Psych  Headaches, Seizures -,  PSYCHIATRIC DISORDERS Depression    GI/Hepatic GERD  ,  Endo/Other  diabetes, Type 2  Renal/GU ESRFRenal disease     Musculoskeletal   Abdominal   Peds  Hematology  (+) Blood dyscrasia, anemia ,   Anesthesia Other Findings   Reproductive/Obstetrics                            Anesthesia Physical Anesthesia Plan  ASA: 3  Anesthesia Plan: MAC and Regional   Post-op Pain Management:    Induction: Intravenous  PONV Risk Score and Plan: 1 and Ondansetron, Propofol infusion and Treatment may vary due to age or medical condition  Airway Management Planned: Simple Face Mask  Additional Equipment:   Intra-op Plan:   Post-operative Plan:   Informed Consent: I have reviewed the patients History and Physical, chart, labs and discussed the procedure including the risks, benefits and alternatives for the proposed anesthesia with the patient or authorized representative who has indicated his/her understanding and acceptance.     Dental advisory given  Plan Discussed with: CRNA, Anesthesiologist and Surgeon  Anesthesia Plan Comments: (PAT note written 05/27/2021 by Myra Gianotti, PA-C. )       Anesthesia Quick Evaluation

## 2021-05-27 NOTE — Progress Notes (Signed)
Anesthesia Chart Review:  Case: [id number] Date/Time: 05/28/21 1014   Procedure: LEFT BRACHIOBASILIC ARTERIOVENOUS FISTULA CREATION (Left) - PERIPHERAL NERVE BLOCK   Anesthesia type: Monitor Anesthesia Care   Pre-op diagnosis: CKD IV   Location: [facility] OR ROOM 11 / [facility]   Surgeons: [name], MD       DISCUSSION: Patient is a 71 year old male scheduled for the above procedure. He is nearly need for dialysis.   History includes former smoker (quit 02/18/21), DM2, HTN, CKD, COPD, anemia, seizure (due to hypoglycemia), alcohol abuse, colon surgery (laparoscopic sigmoid colectomy for diverticulitis), incisional hernia repair (01/14/21).   - ED visit 01/14/21 for leg weakness. 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 MRI with contrast recommended in the future. Out-patient neurology follow-up recommended.   - ED visit 04/06/21 for leg weakness. Suspected degenerative disc disease with bulging disc versus spinal stenosis. Patient did not want to wait for an MRI, so out-patient follow-up advised. - ED visit 04/18/21 for LE weakness with minor fall. Had been having issues for several months, R > L. MRI brain showed likely meningioma and likely subacute right parietal infarct. showed Labs showed progression of kidney disease. Out-patient orthopedic follow-up for his back recommended. He had neurology follow-up with Dr. Posey Pronto on 04/23/21. Felt asymptomatic subacute right parietal ischemic stroke was related to small vessel disease. Continue ASA, Lipitor, BP and DM control. Smoking cessation encouraged. Carotid US ordered which did not show any significant ICA stenosis. He had not followed through yet with PT and MRI of the L-spine for BLE weakness. Continue ASA per [facility].   He is a same-day work-up, so anesthesia team to evaluate on the day of surgery. MAC anesthesia is anticipated.    VS:  BP Readings from Last 3 Encounters:  05/26/21 (!) 107/55  05/20/21 134/68  04/24/21 (!) 165/95    Pulse Readings from Last 3 Encounters:  05/26/21 (!) 59  05/20/21 (!) 59  04/24/21 (!) 54     PROVIDERS: Jilda Panda, MD is PCP  Narda Amber, DO is neurologist. Last visit 04/23/21.  New patient visit with endocrinologist Collier Flowers, MD scheduled for 06/13/21.  Elmarie Shiley, MD is nephrologist   LABS: Most recent lab results include: Lab Results  Component Value Date   WBC 6.2 04/18/2021   HGB 9.7 (L) 05/26/2021   HCT 33.3 (L) 04/18/2021   PLT 195 04/18/2021   GLUCOSE 225 (H) 04/18/2021   ALT 15 04/18/2021   AST 23 04/18/2021   NA 134 (L) 04/18/2021   K 4.1 04/18/2021   CL 99 04/18/2021   CREATININE 5.18 (H) 04/18/2021   BUN 87 (H) 04/18/2021   CO2 23 04/18/2021   TSH 0.80 12/31/2020   HGBA1C 8.0 (H) 12/31/2020    IMAGES: MRI Brain 04/18/21:  IMPRESSION: - Extensive chronic ischemic changes. - Interval development of 1 cm cystic lesion right parietal white matter with peripheral strict diffusion likely subacute infarct. - 8 mm extra-axial lesion right temporoparietal lobe likely meningioma. - Postcontrast MRI brain recommended for further evaluation.   EKG: 04/18/21:  Sinus rhythm Consider left ventricular hypertrophy T wave abnormality Abnormal ECG Confirmed by Carmin Muskrat [phone]) on 04/18/2021 1:03:12 PM - T wave abnormality seems less prominent with compared to 01/09/21 EKG    CV: US Carotid 05/22/21: Summary:  - Right Carotid: The extracranial vessels were near-normal with only minimal  wall  thickening or plaque.  - Left Carotid: There is no evidence of stenosis in the left ICA.  - Vertebrals: Bilateral  vertebral arteries demonstrate antegrade flow.   Echo 08/19/11: Study Conclusions  - Left ventricle: The cavity size was normal. Wall thickness    was normal. The estimated ejection fraction was 60%. Wall    motion was normal; there were no regional wall motion    abnormalities.  - Right ventricle: The cavity size was normal. Systolic     function was normal.    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   COPD (chronic obstructive pulmonary disease) (HCC)    Depression    Diabetes mellitus    no insulin in >80mo   Headache 05/13/2013   Headache(784.0)    Hypertension    Seizures ([facility]    due to hypogylcemia    Past Surgical History:  Procedure Laterality Date   COLON SURGERY     Hemicolectomy for benign colon mass   HERNIA REPAIR     INCISIONAL HERNIA REPAIR N/A 01/14/2021   Procedure: REPAIR OF INCISIONAL HERNIA WITH MESH;  Surgeon: CErroll Luna MD;  [facility] [facility]  Service: General;  Laterality: N/A;    MEDICATIONS: No current facility-administered medications for this encounter.    aspirin EC 81 MG tablet   atorvastatin (LIPITOR) 40 MG tablet   carvedilol (COREG) 25 MG tablet   Cholecalciferol (VITAMIN D3) 50 MCG (2000 UT) CAPS   furosemide (LASIX) 40 MG tablet   hydrALAZINE (APRESOLINE) 50 MG tablet   losartan (COZAAR) 100 MG tablet   pregabalin (LYRICA) 150 MG capsule   traMADol (ULTRAM) 50 MG tablet    AMyra Gianotti PA-C Surgical Short Stay/Anesthesiology [facility] [place]Phone [phone] Surgery Center Wellswood LLCPhone [phone]/28/2023 1:34 PM

## 2021-05-28 ENCOUNTER — Other Ambulatory Visit: Payer: Self-pay

## 2021-05-28 ENCOUNTER — Ambulatory Visit (HOSPITAL_COMMUNITY): Payer: Medicare Other | Admitting: Vascular Surgery

## 2021-05-28 ENCOUNTER — Encounter (HOSPITAL_COMMUNITY): Payer: Self-pay | Admitting: Vascular Surgery

## 2021-05-28 ENCOUNTER — Ambulatory Visit (HOSPITAL_BASED_OUTPATIENT_CLINIC_OR_DEPARTMENT_OTHER): Payer: Medicare Other | Admitting: Vascular Surgery

## 2021-05-28 ENCOUNTER — Encounter (HOSPITAL_COMMUNITY)
Admission: RE | Disposition: A | Discharge status: Home / Self Care | Payer: Self-pay | Source: Home / Self Care | Attending: Vascular Surgery

## 2021-05-28 ENCOUNTER — Ambulatory Visit (HOSPITAL_COMMUNITY)
Admission: RE | Admit: 2021-05-28 | Discharge: 2021-05-28 | Disposition: A | Discharge status: Home / Self Care | Payer: Medicare Other | Attending: Vascular Surgery | Admitting: Vascular Surgery

## 2021-05-28 DIAGNOSIS — J449 Chronic obstructive pulmonary disease, unspecified: Secondary | ICD-10-CM | POA: Insufficient documentation

## 2021-05-28 DIAGNOSIS — Z87891 Personal history of nicotine dependence: Secondary | ICD-10-CM | POA: Insufficient documentation

## 2021-05-28 DIAGNOSIS — I120 Hypertensive chronic kidney disease with stage 5 chronic kidney disease or end stage renal disease: Secondary | ICD-10-CM | POA: Diagnosis not present

## 2021-05-28 DIAGNOSIS — R519 Headache, unspecified: Secondary | ICD-10-CM | POA: Insufficient documentation

## 2021-05-28 DIAGNOSIS — Z7982 Long term (current) use of aspirin: Secondary | ICD-10-CM | POA: Diagnosis not present

## 2021-05-28 DIAGNOSIS — N186 End stage renal disease: Secondary | ICD-10-CM

## 2021-05-28 DIAGNOSIS — E1122 Type 2 diabetes mellitus with diabetic chronic kidney disease: Secondary | ICD-10-CM | POA: Diagnosis not present

## 2021-05-28 DIAGNOSIS — F32A Depression, unspecified: Secondary | ICD-10-CM | POA: Insufficient documentation

## 2021-05-28 DIAGNOSIS — K219 Gastro-esophageal reflux disease without esophagitis: Secondary | ICD-10-CM | POA: Diagnosis not present

## 2021-05-28 DIAGNOSIS — N185 Chronic kidney disease, stage 5: Secondary | ICD-10-CM | POA: Insufficient documentation

## 2021-05-28 DIAGNOSIS — N184 Chronic kidney disease, stage 4 (severe): Secondary | ICD-10-CM

## 2021-05-28 DIAGNOSIS — D631 Anemia in chronic kidney disease: Secondary | ICD-10-CM

## 2021-05-28 HISTORY — DX: Anemia, unspecified: D64.9

## 2021-05-28 HISTORY — DX: Other complications of anesthesia, initial encounter: T88.59XA

## 2021-05-28 HISTORY — DX: Depression, unspecified: F32.A

## 2021-05-28 HISTORY — PX: AV FISTULA PLACEMENT: SHX1204

## 2021-05-28 LAB — GLUCOSE, CAPILLARY
Glucose-Capillary: 261 mg/dL — ABNORMAL HIGH (ref 70–99)
Glucose-Capillary: 197 mg/dL — ABNORMAL HIGH (ref 70–99)
Glucose-Capillary: 130 mg/dL — ABNORMAL HIGH (ref 70–99)

## 2021-05-28 LAB — POCT I-STAT, CHEM 8
BUN: 81 mg/dL — ABNORMAL HIGH (ref 8–23)
Calcium, Ion: 1.23 mmol/L (ref 1.15–1.40)
Chloride: 105 mmol/L (ref 98–111)
Creatinine, Ser: 5.2 mg/dL — ABNORMAL HIGH (ref 0.61–1.24)
Glucose, Bld: 263 mg/dL — ABNORMAL HIGH (ref 70–99)
HCT: 28 % — ABNORMAL LOW (ref 39.0–52.0)
Hemoglobin: 9.5 g/dL — ABNORMAL LOW (ref 13.0–17.0)
Potassium: 4.6 mmol/L (ref 3.5–5.1)
Sodium: 134 mmol/L — ABNORMAL LOW (ref 135–145)
TCO2: 20 mmol/L — ABNORMAL LOW (ref 22–32)

## 2021-05-28 SURGERY — ARTERIOVENOUS (AV) FISTULA CREATION
Anesthesia: Monitor Anesthesia Care | Site: Arm Upper | Laterality: Left

## 2021-05-28 MED ORDER — PHENYLEPHRINE HCL-NACL 20-0.9 MG/250ML-% IV SOLN
INTRAVENOUS | Status: DC | PRN
  Administered 2021-05-28: 20 ug/min via INTRAVENOUS

## 2021-05-28 MED ORDER — HEPARIN SODIUM (PORCINE) 1000 UNIT/ML IJ SOLN
INTRAMUSCULAR | Status: AC
  Filled 2021-05-28: qty 10

## 2021-05-28 MED ORDER — HEPARIN 6000 UNIT IRRIGATION SOLUTION
Status: DC | PRN
  Administered 2021-05-28: 1

## 2021-05-28 MED ORDER — FENTANYL CITRATE (PF) 100 MCG/2ML IJ SOLN
INTRAMUSCULAR | Status: AC
  Administered 2021-05-28: 50 ug via INTRAVENOUS
  Filled 2021-05-28: qty 2

## 2021-05-28 MED ORDER — FENTANYL CITRATE (PF) 100 MCG/2ML IJ SOLN: 50.0000 ug | Freq: Once | INTRAMUSCULAR | Status: AC

## 2021-05-28 MED ORDER — PROPOFOL 10 MG/ML IV BOLUS
INTRAVENOUS | Status: DC | PRN
  Administered 2021-05-28: 40 mg via INTRAVENOUS

## 2021-05-28 MED ORDER — LIDOCAINE-EPINEPHRINE (PF) 1 %-1:200000 IJ SOLN
INTRAMUSCULAR | Status: AC
  Filled 2021-05-28: qty 30

## 2021-05-28 MED ORDER — SODIUM CHLORIDE 0.9 % IV SOLN: INTRAVENOUS | Status: DC

## 2021-05-28 MED ORDER — PHENYLEPHRINE 40 MCG/ML (10ML) SYRINGE FOR IV PUSH (FOR BLOOD PRESSURE SUPPORT)
PREFILLED_SYRINGE | INTRAVENOUS | Status: DC | PRN
  Administered 2021-05-28 (×2): 80 ug via INTRAVENOUS

## 2021-05-28 MED ORDER — FENTANYL CITRATE (PF) 250 MCG/5ML IJ SOLN
INTRAMUSCULAR | Status: AC
  Filled 2021-05-28: qty 5

## 2021-05-28 MED ORDER — PAPAVERINE HCL 30 MG/ML IJ SOLN
INTRAMUSCULAR | Status: AC
  Filled 2021-05-28: qty 2

## 2021-05-28 MED ORDER — INSULIN ASPART 100 UNIT/ML IJ SOLN
INTRAMUSCULAR | Status: AC
  Filled 2021-05-28: qty 1

## 2021-05-28 MED ORDER — HEPARIN 6000 UNIT IRRIGATION SOLUTION
Status: AC
  Filled 2021-05-28: qty 500

## 2021-05-28 MED ORDER — CHLORHEXIDINE GLUCONATE 0.12 % MT SOLN: 15.0000 mL | Freq: Once | OROMUCOSAL | Status: AC

## 2021-05-28 MED ORDER — ONDANSETRON HCL 4 MG/2ML IJ SOLN
INTRAMUSCULAR | Status: DC | PRN
  Administered 2021-05-28: 4 mg via INTRAVENOUS

## 2021-05-28 MED ORDER — STERILE WATER FOR IRRIGATION IR SOLN
Status: DC | PRN
  Administered 2021-05-28: 1000 mL

## 2021-05-28 MED ORDER — OXYCODONE-ACETAMINOPHEN 5-325 MG PO TABS: 1.0000 | ORAL_TABLET | Freq: Four times a day (QID) | ORAL | 0 refills | Status: DC | PRN

## 2021-05-28 MED ORDER — CEFAZOLIN SODIUM-DEXTROSE 2-4 GM/100ML-% IV SOLN
2.0000 g | INTRAVENOUS | Status: AC
  Administered 2021-05-28: 2 g via INTRAVENOUS

## 2021-05-28 MED ORDER — ORAL CARE MOUTH RINSE: 15.0000 mL | Freq: Once | OROMUCOSAL | Status: AC

## 2021-05-28 MED ORDER — PROPOFOL 10 MG/ML IV BOLUS
INTRAVENOUS | Status: AC
  Filled 2021-05-28: qty 20

## 2021-05-28 MED ORDER — PHENYLEPHRINE 40 MCG/ML (10ML) SYRINGE FOR IV PUSH (FOR BLOOD PRESSURE SUPPORT)
PREFILLED_SYRINGE | INTRAVENOUS | Status: AC
  Filled 2021-05-28: qty 10

## 2021-05-28 MED ORDER — EPHEDRINE 5 MG/ML INJ
INTRAVENOUS | Status: AC
  Filled 2021-05-28: qty 5

## 2021-05-28 MED ORDER — 0.9 % SODIUM CHLORIDE (POUR BTL) OPTIME
TOPICAL | Status: DC | PRN
  Administered 2021-05-28: 1000 mL

## 2021-05-28 MED ORDER — CEFAZOLIN SODIUM-DEXTROSE 2-4 GM/100ML-% IV SOLN
INTRAVENOUS | Status: AC
  Filled 2021-05-28: qty 100

## 2021-05-28 MED ORDER — CHLORHEXIDINE GLUCONATE 4 % EX LIQD: 60.0000 mL | Freq: Once | CUTANEOUS | Status: DC

## 2021-05-28 MED ORDER — HEPARIN SODIUM (PORCINE) 1000 UNIT/ML IJ SOLN
INTRAMUSCULAR | Status: DC | PRN
  Administered 2021-05-28: 5000 [IU] via INTRAVENOUS

## 2021-05-28 MED ORDER — PROPOFOL 500 MG/50ML IV EMUL
INTRAVENOUS | Status: DC | PRN
  Administered 2021-05-28: 75 ug/kg/min via INTRAVENOUS

## 2021-05-28 MED ORDER — ONDANSETRON HCL 4 MG/2ML IJ SOLN
INTRAMUSCULAR | Status: AC
  Filled 2021-05-28: qty 2

## 2021-05-28 MED ORDER — INSULIN ASPART 100 UNIT/ML IJ SOLN
0.0000 [IU] | INTRAMUSCULAR | Status: AC | PRN
  Administered 2021-05-28: 8 [IU] via SUBCUTANEOUS
  Administered 2021-05-28: 4 [IU] via SUBCUTANEOUS

## 2021-05-28 MED ORDER — LIDOCAINE HCL (PF) 2 % IJ SOLN
INTRAMUSCULAR | Status: DC | PRN
  Administered 2021-05-28: 200 mg via PERINEURAL

## 2021-05-28 MED ORDER — PROPOFOL 1000 MG/100ML IV EMUL
INTRAVENOUS | Status: AC
  Filled 2021-05-28: qty 100

## 2021-05-28 MED ORDER — MIDAZOLAM HCL 2 MG/2ML IJ SOLN
INTRAMUSCULAR | Status: AC
  Filled 2021-05-28: qty 2

## 2021-05-28 MED ORDER — CHLORHEXIDINE GLUCONATE 0.12 % MT SOLN
OROMUCOSAL | Status: AC
  Administered 2021-05-28: 15 mL
  Filled 2021-05-28: qty 15

## 2021-05-28 SURGICAL SUPPLY — 37 items
ARMBAND PINK RESTRICT EXTREMIT (MISCELLANEOUS) ×2 IMPLANT
BENZOIN TINCTURE PRP APPL 2/3 (GAUZE/BANDAGES/DRESSINGS) ×2 IMPLANT
CANISTER SUCT 3000ML PPV (MISCELLANEOUS) ×2 IMPLANT
CANNULA VESSEL 3MM 2 BLNT TIP (CANNULA) ×2 IMPLANT
CHLORAPREP W/TINT 26 (MISCELLANEOUS) ×2 IMPLANT
CLIP LIGATING EXTRA MED SLVR (CLIP) ×2 IMPLANT
CLIP LIGATING EXTRA SM BLUE (MISCELLANEOUS) ×2 IMPLANT
COVER PROBE W GEL 5X96 (DRAPES) IMPLANT
DERMABOND ADVANCED (GAUZE/BANDAGES/DRESSINGS) ×1
DERMABOND ADVANCED .7 DNX12 (GAUZE/BANDAGES/DRESSINGS) IMPLANT
ELECT REM PT RETURN 9FT ADLT (ELECTROSURGICAL) ×2
ELECTRODE REM PT RTRN 9FT ADLT (ELECTROSURGICAL) ×1 IMPLANT
GAUZE 4X4 16PLY ~~LOC~~+RFID DBL (SPONGE) ×1 IMPLANT
GLOVE SURG POLYISO LF SZ8 (GLOVE) ×2 IMPLANT
GOWN STRL REUS W/ TWL LRG LVL3 (GOWN DISPOSABLE) ×2 IMPLANT
GOWN STRL REUS W/ TWL XL LVL3 (GOWN DISPOSABLE) ×1 IMPLANT
GOWN STRL REUS W/TWL LRG LVL3 (GOWN DISPOSABLE) ×2
GOWN STRL REUS W/TWL XL LVL3 (GOWN DISPOSABLE) ×1
INSERT FOGARTY SM (MISCELLANEOUS) IMPLANT
KIT BASIN OR (CUSTOM PROCEDURE TRAY) ×2 IMPLANT
KIT TURNOVER KIT B (KITS) ×2 IMPLANT
NDL 18GX1X1/2 (RX/OR ONLY) (NEEDLE) IMPLANT
NEEDLE 18GX1X1/2 (RX/OR ONLY) (NEEDLE) IMPLANT
NS IRRIG 1000ML POUR BTL (IV SOLUTION) ×2 IMPLANT
PACK CV ACCESS (CUSTOM PROCEDURE TRAY) ×2 IMPLANT
PAD ARMBOARD 7.5X6 YLW CONV (MISCELLANEOUS) ×4 IMPLANT
SLING ARM FOAM STRAP LRG (SOFTGOODS) ×1 IMPLANT
SPONGE T-LAP 18X18 ~~LOC~~+RFID (SPONGE) ×1 IMPLANT
STRIP CLOSURE SKIN 1/2X4 (GAUZE/BANDAGES/DRESSINGS) ×2 IMPLANT
SUT MNCRL AB 4-0 PS2 18 (SUTURE) ×2 IMPLANT
SUT PROLENE 6 0 BV (SUTURE) ×3 IMPLANT
SUT VIC AB 3-0 SH 27 (SUTURE) ×1
SUT VIC AB 3-0 SH 27X BRD (SUTURE) ×1 IMPLANT
SYR 3ML LL SCALE MARK (SYRINGE) IMPLANT
TOWEL GREEN STERILE (TOWEL DISPOSABLE) ×2 IMPLANT
UNDERPAD 30X36 HEAVY ABSORB (UNDERPADS AND DIAPERS) ×2 IMPLANT
WATER STERILE IRR 1000ML POUR (IV SOLUTION) ×2 IMPLANT

## 2021-05-28 NOTE — Transfer of Care (Signed)
Immediate Anesthesia Transfer of Care Note ? ?Patient: Tyler Patterson ? ?Procedure(s) Performed: LEFT BRACHIOBASILIC ARTERIOVENOUS FISTULA CREATION (Left: Arm Upper) ? ?Patient Location: PACU ? ?Anesthesia Type:MAC and Regional ? ?Level of Consciousness: drowsy ? ?Airway & Oxygen Therapy: Patient Spontanous Breathing ? ?Post-op Assessment: Report given to RN and Post -op Vital signs reviewed and stable ? ?Post vital signs: Reviewed and stable ? ?Last Vitals:  ?Vitals Value Taken Time  ?BP 108/62 05/28/21 1151  ?Temp    ?Pulse 62 05/28/21 1151  ?Resp 14 05/28/21 1151  ?SpO2 100 % 05/28/21 1151  ?Vitals shown include unvalidated device data. ? ?Last Pain:  ?Vitals:  ? 05/28/21 1020  ?TempSrc:   ?PainSc: 0-No pain  ?   ? ?  ? ?Complications: No notable events documented. ?

## 2021-05-28 NOTE — Anesthesia Procedure Notes (Signed)
Anesthesia Regional Block: Supraclavicular block  ? ?Pre-Anesthetic Checklist: , timeout performed,  Correct Patient, Correct Site, Correct Laterality,  Correct Procedure, Correct Position, site marked,  Risks and benefits discussed,  Surgical consent,  Pre-op evaluation,  At surgeon's request and post-op pain management ? ?Laterality: Left ? ?Prep: chloraprep     ?  ?Needles:  ?Injection technique: Single-shot ? ?Needle Type: Echogenic Stimulator Needle   ? ? ?Needle Length: 5cm  ?Needle Gauge: 22  ? ? ? ?Additional Needles: ? ? ?Procedures:, nerve stimulator,,,,,    ? ?Nerve Stimulator or Paresthesia:  ?Response: biceps flexion, 0.45 mA ? ?Additional Responses:  ? ?Narrative:  ?Start time: 05/28/2021 10:12 AM ?End time: 05/28/2021 10:20 AM ?Injection made incrementally with aspirations every 5 mL. ? ?Performed by: Personally  ?Anesthesiologist: Albertha Ghee, MD ? ?Additional Notes: ?Functioning IV was confirmed and monitors were applied.  A 13mm 22ga Arrow echogenic stimulator needle was used. Sterile prep and drape,hand hygiene and sterile gloves were used.  Negative aspiration and negative test dose prior to incremental administration of local anesthetic. The patient tolerated the procedure well. ? ?Ultrasound guidance: relevent anatomy identified, needle position confirmed, local anesthetic spread visualized around nerve(s), vascular puncture avoided.  Image printed for medical record.  ? ? ? ? ?

## 2021-05-28 NOTE — Interval H&P Note (Signed)
History and Physical Interval Note: ? ?05/28/2021 ?10:09 AM ? ?Tyler Patterson  has presented today for surgery, with the diagnosis of CKD IV.  The various methods of treatment have been discussed with the patient and family. After consideration of risks, benefits and other options for treatment, the patient has consented to  Procedure(s) with comments: ?LEFT BRACHIOBASILIC ARTERIOVENOUS FISTULA CREATION (Left) - PERIPHERAL NERVE BLOCK as a surgical intervention.  The patient's history has been reviewed, patient examined, no change in status, stable for surgery.  I have reviewed the patient's chart and labs.  Questions were answered to the patient's satisfaction.   ? ? ?[name] ? ? ?

## 2021-05-28 NOTE — Interval H&P Note (Signed)
History and Physical Interval Note: ? ?05/28/2021 ?9:50 AM ? ?Tyler Patterson  has presented today for surgery, with the diagnosis of CKD IV.  The various methods of treatment have been discussed with the patient and family. After consideration of risks, benefits and other options for treatment, the patient has consented to  Procedure(s) with comments: ?LEFT BRACHIOBASILIC ARTERIOVENOUS FISTULA CREATION (Left) - PERIPHERAL NERVE BLOCK as a surgical intervention.  The patient's history has been reviewed, patient examined, no change in status, stable for surgery.  I have reviewed the patient's chart and labs.  Questions were answered to the patient's satisfaction.   ? ? ?[name] ? ? ?

## 2021-05-28 NOTE — Discharge Instructions (Signed)
? ?[facility] ? ?Discharge Instructions ? ?AV Fistula or Graft Surgery for Dialysis Access ? ?Please refer to the following instructions for your post-procedure care. Your surgeon or physician assistant will discuss any changes with you. ? ?Activity ? ?You may drive the day following your surgery, if you are comfortable and no longer taking prescription pain medication. Resume full activity as the soreness in your incision resolves. ? ?Bathing/Showering ? ?You may shower after you go home. Keep your incision dry for 48 hours. Do not soak in a bathtub, hot tub, or swim until the incision heals completely. You may not shower if you have a hemodialysis catheter. ? ?Incision Care ? ?Clean your incision with mild soap and water after 48 hours. Pat the area dry with a clean towel. You do not need a bandage unless otherwise instructed. Do not apply any ointments or creams to your incision. You may have skin glue on your incision. Do not peel it off. It will come off on its own in about one week. Your arm may swell a bit after surgery. To reduce swelling use pillows to elevate your arm so it is above your heart. Your doctor will tell you if you need to lightly wrap your arm with an ACE bandage. ? ?Diet ? ?Resume your normal diet. There are not special food restrictions following this procedure. In order to heal from your surgery, it is CRITICAL to get adequate nutrition. Your body requires vitamins, minerals, and protein. Vegetables are the best source of vitamins and minerals. Vegetables also provide the perfect balance of protein. Processed food has little nutritional value, so try to avoid this. ? ?Medications ? ?Resume taking all of your medications. If your incision is causing pain, you may take over-the counter pain relievers such as acetaminophen (Tylenol). If you were prescribed a stronger pain medication, please be aware these medications can cause nausea and constipation. Prevent  nausea by taking the medication with a snack or meal. Avoid constipation by drinking plenty of fluids and eating foods with high amount of fiber, such as fruits, vegetables, and grains.  ?Do not take Tylenol if you are taking prescription pain medications. ? ?Follow up ?Your surgeon may want to see you in the office following your access surgery. If so, this will be arranged at the time of your surgery. ? ?Please call us immediately for any of the following conditions: ? ?Increased pain, redness, drainage (pus) from your incision site ?Fever of 101 degrees or higher ?Severe or worsening pain at your incision site ?Hand pain or numbness. ? ?Reduce your risk of vascular disease: ? ?Stop smoking. If you would like help, call [facility] at 1-800-QUIT-NOW 573-207-7770) or [facility] at (949) 285-0228 ? ?Manage your cholesterol ?Maintain a desired weight ?Control your diabetes ?Keep your blood pressure down ? ?Dialysis ? ?It will take several weeks to several months for your new dialysis access to be ready for use. Your surgeon will determine when it is okay to use it. Your nephrologist will continue to direct your dialysis. You can continue to use your Permcath until your new access is ready for use. ? ? ?05/28/2021 ?[name] ?[id number] ?1950-12-24 ? ?Surgeon(s): ?[name], MD ? ?Procedure(s): ?LEFT BRACHIOBASILIC ARTERIOVENOUS FISTULA CREATION ? ? May stick graft immediately  ? May stick graft on designated area only:   ?X Do not stick left AV fistula for 12 weeks  ? ? ?If you have any questions, please call the office at  [phone] ?

## 2021-05-28 NOTE — Op Note (Signed)
DATE OF SERVICE: 05/28/2021 ? ?PATIENT:  Tyler Patterson  71 y.o. male ? ?PRE-OPERATIVE DIAGNOSIS:  CKD V ? ?POST-OPERATIVE DIAGNOSIS:  Same ? ?PROCEDURE:   ?left first stage brachiobasilic arteriovenous fistula creation ? ?SURGEON:  Surgeon(s) and Role: ?   * [name], MD - Primary ? ?ASSISTANT: Paulo Fruit, PA-C ? ?An experienced assistant was required given the complexity of this procedure and the standard of surgical care. My assistant helped with exposure through counter tension, suctioning, ligation and retraction to better visualize the surgical field.  My assistant expedited sewing during the case by following my sutures. Wherever I use the term "we" in the report, my assistant actively helped me with that portion of the procedure. ? ?ANESTHESIA:   regional and IV sedation ? ?EBL: minimal ? ?BLOOD ADMINISTERED:none ? ?DRAINS: none  ? ?LOCAL MEDICATIONS USED:  NONE ? ?SPECIMEN:  none ? ?COUNTS: confirmed correct. ? ?TOURNIQUET:  none ? ?PATIENT DISPOSITION:  PACU - hemodynamically stable. ?  ?Delay start of Pharmacological VTE agent (>24hrs) due to surgical blood loss or risk of bleeding: no ? ?INDICATION FOR PROCEDURE: Tyler Patterson is a 71 y.o. male with CKD 5  in need of dialysis access . After careful discussion of risks, benefits, and alternatives the patient was offered left brachiobasilic arteriovenous fistula. The patient understood and wished to proceed. ? ?OPERATIVE FINDINGS: unremarkable first stage basilic vein fistula creation ? ?DESCRIPTION OF PROCEDURE: After identification of the patient in the pre-operative holding area, the patient was transferred to the operating room. The patient was positioned supine on the operating room table. Anesthesia was induced. The left arm was prepped and draped in standard fashion. A surgical pause was performed confirming correct patient, procedure, and operative location. ? ?Using intraoperative ultrasound the brachial artery and basilic vein were  mapped.  A transverse incision was planned over the course of the two vessels in the antecubital fossa to allow fistula creation.  Incision was created.  Incision was carried down through subcutaneous tissue.  The aponeurosis of the biceps tendon was divided.  The brachial sheath was identified.  The brachial artery was skeletonized.  The artery was encircled with 2 Silastic Vesseloops.  Next attention was turned to the basilic vein.  This was identified in the medial arm in its typical position.  The vein was mobilized throughout the length of the incision to allow tension-free arteriovenous fistula creation.  The distal end of the vein was clamped with a right angle.  The proximal end of the vein was clamped with a bulldog.  The vein was transected distally.  The stump was oversewn with a 2-0 silk.  The cut end of the vein was spatulated and distended with a mosquito clamp.  Patient was systemically heparinized with 3000 units of IV heparin.  After a three minute pause, the brachial artery was clamped proximally distally.  The basilic vein was anastomosed to the brachial artery into side using continuous running suture of 6-0 Prolene.  Immediately prior to completion the anastomosis was flushed and de-aired.  The anastomosis was completed.  Clamps were released.  Hemostasis was achieved.  An audible bruit was heard in the fistula.  Palpable pulse was felt in the left wrist.  Stasis was achieved in the surgical bed.  The wound was closed with 3-0 Vicryl and 4-0 Monocryl. ? ?Upon completion of the case instrument and sharps counts were confirmed correct. The patient was transferred to the PACU in good condition. I was present for  all portions of the procedure. ? ?Yevonne Aline. Stanford Breed, MD ?[facility] ?Office Phone Number: [phone] ?05/28/2021 12:10 PM ? ? ? ?

## 2021-05-29 ENCOUNTER — Encounter (HOSPITAL_COMMUNITY): Payer: Self-pay | Admitting: Vascular Surgery

## 2021-05-29 NOTE — Anesthesia Postprocedure Evaluation (Signed)
Anesthesia Post Note ? ?Patient: ELLIAS MCELREATH ? ?Procedure(s) Performed: LEFT BRACHIOBASILIC ARTERIOVENOUS FISTULA CREATION (Left: Arm Upper) ? ?  ? ?Patient location during evaluation: PACU ?Anesthesia Type: MAC and Regional ?Level of consciousness: awake and alert ?Pain management: pain level controlled ?Vital Signs Assessment: post-procedure vital signs reviewed and stable ?Respiratory status: spontaneous breathing, nonlabored ventilation, respiratory function stable and patient connected to nasal cannula oxygen ?Cardiovascular status: stable and blood pressure returned to baseline ?Postop Assessment: no apparent nausea or vomiting ?Anesthetic complications: no ? ? ?No notable events documented. ? ?Last Vitals:  ?Vitals:  ? 05/28/21 1215 05/28/21 1222  ?BP: (!) 102/57 106/60  ?Pulse: (!) 57 (!) 58  ?Resp: 12 12  ?Temp:  36.5 ?C  ?SpO2: 100% 100%  ?  ?Last Pain:  ?Vitals:  ? 05/28/21 1222  ?TempSrc:   ?PainSc: 0-No pain  ? ? ?  ?  ?  ?  ?  ?  ? ?Allisonia S ? ? ? ? ?

## 2021-06-04 ENCOUNTER — Ambulatory Visit (HOSPITAL_COMMUNITY)
Admission: RE | Admit: 2021-06-04 | Discharge: 2021-06-04 | Disposition: A | Discharge status: Home / Self Care | Payer: Medicare Other | Source: Ambulatory Visit | Attending: Nephrology | Admitting: Nephrology

## 2021-06-04 ENCOUNTER — Other Ambulatory Visit: Payer: Self-pay

## 2021-06-04 VITALS — BP 96/54 | HR 61 | Temp 97.3°F | Resp 18

## 2021-06-04 DIAGNOSIS — N184 Chronic kidney disease, stage 4 (severe): Secondary | ICD-10-CM | POA: Diagnosis present

## 2021-06-04 DIAGNOSIS — D631 Anemia in chronic kidney disease: Secondary | ICD-10-CM | POA: Insufficient documentation

## 2021-06-04 LAB — POCT HEMOGLOBIN-HEMACUE: Hemoglobin: 8.3 g/dL — ABNORMAL LOW (ref 13.0–17.0)

## 2021-06-04 MED ORDER — EPOETIN ALFA-EPBX 10000 UNIT/ML IJ SOLN
10000.0000 [IU] | INTRAMUSCULAR | Status: DC
  Administered 2021-06-04: 10000 [IU] via SUBCUTANEOUS

## 2021-06-04 MED ORDER — EPOETIN ALFA-EPBX 10000 UNIT/ML IJ SOLN
INTRAMUSCULAR | Status: AC
  Filled 2021-06-04: qty 1

## 2021-06-13 ENCOUNTER — Encounter: Payer: Self-pay | Admitting: Internal Medicine

## 2021-06-13 ENCOUNTER — Other Ambulatory Visit: Payer: Self-pay | Admitting: Internal Medicine

## 2021-06-13 ENCOUNTER — Other Ambulatory Visit: Payer: Self-pay

## 2021-06-13 ENCOUNTER — Ambulatory Visit (INDEPENDENT_AMBULATORY_CARE_PROVIDER_SITE_OTHER): Payer: Medicare Other | Admitting: Internal Medicine

## 2021-06-13 VITALS — BP 134/72 | HR 72 | Ht 68.0 in | Wt 188.2 lb

## 2021-06-13 DIAGNOSIS — E1122 Type 2 diabetes mellitus with diabetic chronic kidney disease: Secondary | ICD-10-CM | POA: Diagnosis not present

## 2021-06-13 DIAGNOSIS — E1165 Type 2 diabetes mellitus with hyperglycemia: Secondary | ICD-10-CM | POA: Insufficient documentation

## 2021-06-13 DIAGNOSIS — N184 Chronic kidney disease, stage 4 (severe): Secondary | ICD-10-CM | POA: Diagnosis not present

## 2021-06-13 DIAGNOSIS — R739 Hyperglycemia, unspecified: Secondary | ICD-10-CM

## 2021-06-13 DIAGNOSIS — Z794 Long term (current) use of insulin: Secondary | ICD-10-CM | POA: Diagnosis not present

## 2021-06-13 LAB — POCT GLYCOSYLATED HEMOGLOBIN (HGB A1C): Hemoglobin A1C: 11 % — AB (ref 4.0–5.6)

## 2021-06-13 MED ORDER — NOVOLOG FLEXPEN 100 UNIT/ML ~~LOC~~ SOPN: PEN_INJECTOR | SUBCUTANEOUS | 6 refills | Status: DC

## 2021-06-13 MED ORDER — BD PEN NEEDLE NANO U/F 32G X 4 MM MISC
1.0000 | Freq: Three times a day (TID) | 3 refills | Status: DC
Start: 2021-06-13 — End: 2022-09-29

## 2021-06-13 MED ORDER — FREESTYLE LIBRE 2 SENSOR MISC: 1.0000 | 3 refills | Status: DC

## 2021-06-13 MED ORDER — ONETOUCH VERIO VI STRP: 1.0000 | ORAL_STRIP | Freq: Three times a day (TID) | 3 refills | Status: AC

## 2021-06-13 NOTE — Progress Notes (Signed)
?Name: Tyler Patterson  ?MRN/ DOB: [id number], 1951-01-16   ?Age/ Sex: 71 y.o., male   ? ?PCP: Jilda Panda, MD   ?Reason for Endocrinology Evaluation: Type 2 Diabetes Mellitus  ?   ?Date of Initial Endocrinology Visit: 06/13/2021   ? ? ?PATIENT IDENTIFIER: Tyler Patterson is a 71 y.o. male with a past medical history of T2DM and HTN, CKD IV , Hx of alcohol abuse . The patient presented for initial endocrinology clinic visit on 06/13/2021 for consultative assistance with his diabetes management.  ? ? ?HPI: ?Tyler Patterson  is accompanied by his two sisters  ? ? ?Diagnosed with DM in 2011 ?Prior Medications tried/Intolerance: Novolog/Lantus stopped during the pandemic due to loss of transportation  ?Currently checking blood sugars multiple  x / day ?Hypoglycemia episodes : yes               Symptoms: yes                  ?Hemoglobin A1c has ranged from 8.0% in 2022, peaking at 13.5% in 2018. ?Patient required assistance for hypoglycemia: yes - 2021 ? ? ?In terms of diet, the patient eats 1 meals  ? ? ?Denies nausea, vomiting or diarrhea  ? ?HOME DIABETES REGIMEN: ?N/A ? ? ?Statin: yes ?ACE-I/ARB: yes ? ? ? ? ?CONTINUOUS GLUCOSE MONITORING RECORD INTERPRETATION   ? ?Dates of Recording: 3/4-3/17/2023 ? ?Sensor description:frestyle libre  ? ?Results statistics: ?  ?CGM use % of time 63  ?Average and SD 140/36.5  ?Time in range     69   %  ?% Time Above 180 20  ?% Time above 250 2  ?% Time Below target 9  ? ?Glycemic patterns summary: BG's have remained noted to be optimal overnight and hyperglycemia noted during the day ? ?Hyperglycemic episodes postprandial ? ?Hypoglycemic episodes occurred overnight ? ?Overnight periods: Trends down ? ? ?DIABETIC COMPLICATIONS: ?Microvascular complications:  ?CKD IV, neuropathy ?Denies: retinopathy ?Last eye exam: Completed > 2 yrs ago ?Macrovascular complications:  ? ?Denies: CAD, PVD, CVA ? ? ?PAST HISTORY: ?Past Medical History:  ?Past Medical History:  ?Diagnosis Date  ? Alcohol abuse   ?  Anemia   ? Chronic kidney disease   ? 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 Complication of anesthesia   ? patient 's sister reports that patient has not been able to walk patient had hernia in October 2022.  ? COPD (chronic obstructive pulmonary disease) ([place])   ? Depression   ? Diabetes mellitus   ? no insulin in >93mos  ? Headache 05/13/2013  ? Headache(784.0)   ? Hypertension   ? ?Past Surgical History:  ?Past Surgical History:  ?Procedure Laterality Date  ? AV FISTULA PLACEMENT Left 05/28/2021  ? Procedure: LEFT BRACHIOBASILIC ARTERIOVENOUS FISTULA CREATION;  Surgeon: [name], MD;  Location: [facility] OR;  Service: Vascular;  Laterality: Left;  PERIPHERAL NERVE BLOCK  ? COLON SURGERY    ? Hemicolectomy for benign colon mass  ? HERNIA REPAIR    ? INCISIONAL HERNIA REPAIR N/A 01/14/2021  ? Procedure: REPAIR OF INCISIONAL HERNIA WITH MESH;  Surgeon: Erroll Luna, MD;  Location: [facility];  Service: General;  Laterality: N/A;  ?  ?Social History:  reports that he quit smoking about 3 months ago. His smoking use included cigars and cigarettes. He smoked an average of .25 packs per day. He has never used smokeless tobacco. He reports that he does not currently use alcohol. He reports that  he does not use drugs. ?Family History:  ?Family History  ?Problem Relation Age of Onset  ? Lung cancer Mother   ? Lung cancer Father   ? Diabetes Mellitus II Neg Hx   ? ? ? ?HOME MEDICATIONS: ?Allergies as of 06/13/2021   ? ?   Reactions  ? Duloxetine   ? Other reaction(s): Abdominal pain  ? Fiorinal [butalbital-aspirin-caffeine] Swelling  ? Spironolactone   ? Other reaction(s): Gynecomastia  ? ?  ? ?  ?Medication List  ?  ? ?  ? Accurate as of June 13, 2021  4:37 PM. If you have any questions, ask your nurse or doctor.  ?  ?  ? ?  ? ?aspirin EC 81 MG tablet ?Take 81 mg by mouth daily. Swallow whole. ?  ?atorvastatin 40 MG tablet ?Commonly known as: LIPITOR ?Take 20 mg by mouth daily. ?  ?BD Pen Needle Nano U/F 32G X 4 MM  Misc ?Generic drug: Insulin Pen Needle ?1 Device by Does not apply route 3 (three) times daily. ?Started by: Dorita Sciara, MD ?  ?carvedilol 25 MG tablet ?Commonly known as: COREG ?Take 25 mg by mouth 2 (two) times daily with a meal. ?  ?FreeStyle Libre 2 Sensor Misc ?1 Device by Does not apply route every 14 (fourteen) days. ?Started by: Dorita Sciara, MD ?  ?furosemide 40 MG tablet ?Commonly known as: LASIX ?Take 40 mg by mouth 2 (two) times daily. ?  ?hydrALAZINE 50 MG tablet ?Commonly known as: APRESOLINE ?Take 50 mg by mouth 3 (three) times daily. ?  ?insulin lispro 100 UNIT/ML KwikPen ?Commonly known as: HumaLOG KwikPen ?Max daily 20 units ?Started by: Dorita Sciara, MD ?  ?losartan 100 MG tablet ?Commonly known as: COZAAR ?Take 100 mg by mouth daily. ?  ?metolazone 5 MG tablet ?Commonly known as: ZAROXOLYN ?Take 5 mg by mouth daily. Monday,Wednesday, Friday as need for swelling ?  ?OneTouch Verio test strip ?Generic drug: glucose blood ?1 each by Other route 3 (three) times daily. Use as instructed ?Started by: Dorita Sciara, MD ?  ?oxyCODONE-acetaminophen 5-325 MG tablet ?Commonly known as: Percocet ?Take 1 tablet by mouth every 6 (six) hours as needed for severe pain. ?  ?pregabalin 150 MG capsule ?Commonly known as: LYRICA ?Take 150 mg by mouth 3 (three) times daily. ?  ?vitamin D3 50 MCG (2000 UT) Caps ?Take 1 capsule by mouth daily. ?  ? ?  ? ? ? ?ALLERGIES: ?Allergies  ?Allergen Reactions  ? Duloxetine   ?  Other reaction(s): Abdominal pain  ? Fiorinal [Butalbital-Aspirin-Caffeine] Swelling  ? Spironolactone   ?  Other reaction(s): Gynecomastia  ? ? ? ?REVIEW OF SYSTEMS: ?A comprehensive ROS was conducted with the patient and is negative except as per HPI  ? ?  ?OBJECTIVE:  ? ?VITAL SIGNS: BP 134/72 (BP Location: Left Arm, Patient Position: Sitting, Cuff Size: Small)   Pulse 72   Ht 5\' 8"  (1.727 m)   Wt 188 lb 3.2 oz (85.4 kg)   SpO2 98%   BMI 28.62 kg/m?   ? ?PHYSICAL  EXAM:  ?General: Pt appears well and is in NAD  ?Lungs: Clear with good BS bilat with no rales, rhonchi, or wheezes  ?Heart: RRR with normal S1 and S2 and no gallops; no murmurs; no rub  ?Abdomen: Normoactive bowel sounds, soft, nontender, without masses or organomegaly palpable  ?Extremities: Trace  pretibial edema. No lesions.  ?Neuro: MS is good with appropriate affect, pt is alert  and Ox3  ? ? ? ?DATA REVIEWED: ? ?Lab Results  ?Component Value Date  ? HGBA1C 11.0 (A) 06/13/2021  ? HGBA1C 8.0 (H) 12/31/2020  ? HGBA1C 13.5 (H) 08/27/2016  ? ? Latest Reference Range & Units 05/28/21 08:37  ?Sodium 135 - 145 mmol/L 134 (L)  ?Potassium 3.5 - 5.1 mmol/L 4.6  ?Chloride 98 - 111 mmol/L 105  ?Glucose 70 - 99 mg/dL 263 (H)  ?BUN 8 - 23 mg/dL 81 (H)  ?Creatinine 0.61 - 1.24 mg/dL 5.20 (H)  ?Calcium Ionized 1.15 - 1.40 mmol/L 1.23  ? ? ? ? ? ?Lab Results  ?Component Value Date  ? MICROALBUR 1.79 06/26/2010  ? [place] 99 06/26/2010  ? CREATININE 5.20 (H) 05/28/2021  ? ?No results found for: MICRALBCREAT ? ?Lab Results  ?Component Value Date  ? CHOL 171 06/26/2010  ? HDL 61 06/26/2010  ? [place] 99 06/26/2010  ? TRIG 57 06/26/2010  ? CHOLHDL 2.8 Ratio 06/26/2010  ?     ? ?ASSESSMENT / PLAN / RECOMMENDATIONS:  ? ?1) Type 2 Diabetes Mellitus, poorly controlled, With CKD IV complications - Most recent A1c of 11.0%. Goal A1c <7.0%.   ? ?-I have reviewed his CGM download today and I do believe that his CGM reading are discordant with his A1c, as his average BG's are 140 mg/DL on CGM but his A1c is 11.0% ?-I have asked the patient to check fingersticks for 1-2 days after applying a new sensor to confirm accuracy of BG readings ?-We discussed limited oral glycemic agents due to CKD IV ?-I have recommended starting prandial insulin per correction scale, sister is concerned as the patient has had severe hypoglycemic episode requiring assistance ?-I did explain the risk of hypoglycemia with insulin specially if the patient takes the  wrong dose, or high doses but I am going to start him on correction scale only ?-I would not put him on basal insulin at this time unless more  accurate data is available ? ?MEDICATIONS: ?Start Humalog (BG -130/45

## 2021-06-13 NOTE — Patient Instructions (Signed)
Novolog correctional insulin: Use the scale below to help guide you at Breakfast, Lunch and Dinner time  ? ?Blood sugar before meal Number of units to inject  ?Less than 175 0 unit  ?176 -  220 1 units  ?221 -  265 2 units  ?266 -  310 3 units  ?311 -  355 4 units  ?356 -  400 5 units  ?401 -  445 6 units  ? ?HOW TO TREAT LOW BLOOD SUGARS (Blood sugar LESS THAN 70 MG/DL) ?Please follow the RULE OF 15 for the treatment of hypoglycemia treatment (when your (blood sugars are less than 70 mg/dL)  ? ?STEP 1: Take 15 grams of carbohydrates when your blood sugar is low, which includes:  ?3-4 GLUCOSE TABS  OR ?3-4 OZ OF JUICE OR REGULAR SODA OR ?ONE TUBE OF GLUCOSE GEL   ? ?STEP 2: RECHECK blood sugar in 15 MINUTES ?STEP 3: If your blood sugar is still low at the 15 minute recheck --> then, go back to STEP 1 and treat AGAIN with another 15 grams of carbohydrates. ? ?

## 2021-06-16 ENCOUNTER — Encounter: Payer: Self-pay | Admitting: Internal Medicine

## 2021-06-19 ENCOUNTER — Other Ambulatory Visit: Payer: Self-pay

## 2021-06-19 ENCOUNTER — Encounter (HOSPITAL_COMMUNITY)
Admission: RE | Admit: 2021-06-19 | Discharge: 2021-06-19 | Disposition: A | Discharge status: Home / Self Care | Payer: Medicare Other | Source: Ambulatory Visit | Attending: Nephrology | Admitting: Nephrology

## 2021-06-19 ENCOUNTER — Encounter (HOSPITAL_COMMUNITY): Payer: Medicare Other

## 2021-06-19 VITALS — BP 100/47 | HR 70 | Temp 97.4°F | Resp 20

## 2021-06-19 DIAGNOSIS — N184 Chronic kidney disease, stage 4 (severe): Secondary | ICD-10-CM | POA: Diagnosis present

## 2021-06-19 DIAGNOSIS — D631 Anemia in chronic kidney disease: Secondary | ICD-10-CM | POA: Insufficient documentation

## 2021-06-19 LAB — IRON AND TIBC
Iron: 82 ug/dL (ref 45–182)
Saturation Ratios: 29 % (ref 17.9–39.5)
TIBC: 280 ug/dL (ref 250–450)
UIBC: 198 ug/dL

## 2021-06-19 LAB — FERRITIN: Ferritin: 403 ng/mL — ABNORMAL HIGH (ref 24–336)

## 2021-06-19 MED ORDER — EPOETIN ALFA-EPBX 10000 UNIT/ML IJ SOLN
INTRAMUSCULAR | Status: AC
  Filled 2021-06-19: qty 1

## 2021-06-19 MED ORDER — EPOETIN ALFA-EPBX 10000 UNIT/ML IJ SOLN
10000.0000 [IU] | INTRAMUSCULAR | Status: DC
  Administered 2021-06-19: 10000 [IU] via SUBCUTANEOUS

## 2021-06-20 LAB — POCT HEMOGLOBIN-HEMACUE: Hemoglobin: 8.4 g/dL — ABNORMAL LOW (ref 13.0–17.0)

## 2021-07-01 ENCOUNTER — Encounter: Payer: Medicare Other | Admitting: Vascular Surgery

## 2021-07-01 ENCOUNTER — Encounter (HOSPITAL_COMMUNITY): Payer: Medicare Other

## 2021-07-03 ENCOUNTER — Encounter (HOSPITAL_COMMUNITY)
Admission: RE | Admit: 2021-07-03 | Discharge: 2021-07-03 | Disposition: A | Discharge status: Home / Self Care | Payer: Medicare Other | Source: Ambulatory Visit | Attending: Nephrology | Admitting: Nephrology

## 2021-07-03 VITALS — BP 122/58 | HR 65 | Temp 97.6°F | Resp 20

## 2021-07-03 DIAGNOSIS — N184 Chronic kidney disease, stage 4 (severe): Secondary | ICD-10-CM | POA: Insufficient documentation

## 2021-07-03 DIAGNOSIS — D631 Anemia in chronic kidney disease: Secondary | ICD-10-CM | POA: Insufficient documentation

## 2021-07-03 LAB — POCT HEMOGLOBIN-HEMACUE: Hemoglobin: 9.1 g/dL — ABNORMAL LOW (ref 13.0–17.0)

## 2021-07-03 MED ORDER — EPOETIN ALFA-EPBX 10000 UNIT/ML IJ SOLN
INTRAMUSCULAR | Status: AC
  Filled 2021-07-03: qty 1

## 2021-07-03 MED ORDER — EPOETIN ALFA-EPBX 10000 UNIT/ML IJ SOLN
10000.0000 [IU] | INTRAMUSCULAR | Status: DC
  Administered 2021-07-03: 10000 [IU] via SUBCUTANEOUS

## 2021-07-17 ENCOUNTER — Encounter (HOSPITAL_COMMUNITY)
Admission: RE | Admit: 2021-07-17 | Discharge: 2021-07-17 | Disposition: A | Discharge status: Home / Self Care | Payer: Medicare Other | Source: Ambulatory Visit | Attending: Nephrology | Admitting: Nephrology

## 2021-07-17 ENCOUNTER — Other Ambulatory Visit: Payer: Self-pay

## 2021-07-17 VITALS — BP 106/61 | HR 60 | Temp 97.4°F

## 2021-07-17 DIAGNOSIS — N185 Chronic kidney disease, stage 5: Secondary | ICD-10-CM

## 2021-07-17 DIAGNOSIS — D631 Anemia in chronic kidney disease: Secondary | ICD-10-CM

## 2021-07-17 DIAGNOSIS — N184 Chronic kidney disease, stage 4 (severe): Secondary | ICD-10-CM | POA: Diagnosis not present

## 2021-07-17 LAB — IRON AND TIBC
Iron: 34 ug/dL — ABNORMAL LOW (ref 45–182)
Saturation Ratios: 11 % — ABNORMAL LOW (ref 17.9–39.5)
TIBC: 298 ug/dL (ref 250–450)
UIBC: 264 ug/dL

## 2021-07-17 LAB — POCT HEMOGLOBIN-HEMACUE: Hemoglobin: 9.5 g/dL — ABNORMAL LOW (ref 13.0–17.0)

## 2021-07-17 LAB — FERRITIN: Ferritin: 242 ng/mL (ref 24–336)

## 2021-07-17 MED ORDER — EPOETIN ALFA-EPBX 10000 UNIT/ML IJ SOLN
10000.0000 [IU] | INTRAMUSCULAR | Status: DC
  Administered 2021-07-17: 10000 [IU] via SUBCUTANEOUS

## 2021-07-17 MED ORDER — EPOETIN ALFA-EPBX 10000 UNIT/ML IJ SOLN
INTRAMUSCULAR | Status: AC
Start: 2021-07-17 — End: 2021-07-17
  Filled 2021-07-17: qty 1

## 2021-07-21 NOTE — H&P (View-Only) (Signed)
[facility] [place] ? ?ASSESSMENT / PLAN: ?Tyler Patterson is a 71 y.o. with CKD V s/p left first stage brachiobasilic arteriovenous fistula 05/28/21. Duplex today shows high quality fistula. Plan second stage transposition early next week as schedule allows.  ? ?CHIEF COMPLAINT: in need of dialysis access ? ?HISTORY OF PRESENT ILLNESS: ?Tyler Patterson is a 71 y.o. male who presents to clinic for evaluation of permanent dialysis access.  He is right-handed.  He has never had dialysis access before.  No history of central venous catheterization.  We long discussion about dialysis.  We discussed: different options for dialysis access including peritoneal dialysis and hemodialysis;  The expected treatment course of dialysis;  the need for maintenance of all dialysis access surgery. ? ?07/22/21: Patient returns to clinic s/p first stage basilic vein arteriovenous fistula. Duplex today shows good result from fistula creation. He does not yet need dialysis access. I offered him transposition to allow the fistula to be used as soon as possible. He is amenable.  ? ?Past Medical History:  ?Diagnosis Date  ? Alcohol abuse   ? Anemia   ? Chronic kidney disease   ? 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 Complication of anesthesia   ? patient 's sister reports that patient has not been able to walk patient had hernia in October 2022.  ? COPD (chronic obstructive pulmonary disease) ([place])   ? Depression   ? Diabetes mellitus   ? no insulin in >53mos  ? Headache 05/13/2013  ? Headache(784.0)   ? Hypertension   ? ? ?Past Surgical History:  ?Procedure Laterality Date  ? AV FISTULA PLACEMENT Left 05/28/2021  ? Procedure: LEFT BRACHIOBASILIC ARTERIOVENOUS FISTULA CREATION;  Surgeon: [name], MD;  Location: [facility] OR;  Service: Vascular;  Laterality: Left;  PERIPHERAL NERVE BLOCK  ? COLON SURGERY    ? Hemicolectomy for benign colon mass  ? HERNIA REPAIR    ? INCISIONAL HERNIA REPAIR N/A 01/14/2021  ? Procedure: REPAIR OF INCISIONAL  HERNIA WITH MESH;  Surgeon: Erroll Luna, MD;  Location: [place];  Service: General;  Laterality: N/A;  ? ? ?Family History  ?Problem Relation Age of Onset  ? Lung cancer Mother   ? Lung cancer Father   ? Diabetes Mellitus II Neg Hx   ? ? ?Social History  ? ?Socioeconomic History  ? Marital status: Married  ?  Spouse name: Not on file  ? Number of children: 0  ? Years of education: Not on file  ? Highest education level: Not on file  ?Occupational History  ? Not on file  ?Tobacco Use  ? Smoking status: Former  ?  Packs/day: 0.25  ?  Types: Cigars, Cigarettes  ?  Quit date: 02/18/2021  ?  Years since quitting: 0.4  ? Smokeless tobacco: Never  ?Vaping Use  ? Vaping Use: Never used  ?Substance and Sexual Activity  ? Alcohol use: Not Currently  ?  Comment: 05/26/21- doesnt want any-++  ? Drug use: No  ? Sexual activity: Not Currently  ?Other Topics Concern  ? Not on file  ?Social History Narrative  ? Lives in a two story home   ? Right Handed   ? Drinks no caffeine   ? ?Social Determinants of Health  ? ?Financial Resource Strain: Not on file  ?Food Insecurity: Not on file  ?Transportation Needs: Not on file  ?Physical Activity: Not on file  ?Stress: Not on file  ?Social Connections: Not on  file  ?Intimate Partner Violence: Not on file  ? ? ?Allergies  ?Allergen Reactions  ? Duloxetine   ?  Other reaction(s): Abdominal pain  ? Fiorinal [Butalbital-Aspirin-Caffeine] Swelling  ? Spironolactone   ?  Other reaction(s): Gynecomastia  ? ? ?Current Outpatient Medications  ?Medication Sig Dispense Refill  ? aspirin EC 81 MG tablet Take 81 mg by mouth daily. Swallow whole.    ? atorvastatin (LIPITOR) 40 MG tablet Take 20 mg by mouth daily.    ? carvedilol (COREG) 25 MG tablet Take 25 mg by mouth 2 (two) times daily with a meal.    ? Cholecalciferol (VITAMIN D3) 50 MCG (2000 UT) CAPS Take 1 capsule by mouth daily.    ? Continuous Blood Gluc Sensor (FREESTYLE LIBRE 2 SENSOR) MISC 1 Device by Does not apply route  every 14 (fourteen) days. 6 each 3  ? furosemide (LASIX) 40 MG tablet Take 40 mg by mouth 2 (two) times daily.    ? glucose blood (ONETOUCH VERIO) test strip 1 each by Other route 3 (three) times daily. Use as instructed 300 each 3  ? hydrALAZINE (APRESOLINE) 50 MG tablet Take 50 mg by mouth 3 (three) times daily.    ? insulin lispro (HUMALOG KWIKPEN) 100 UNIT/ML KwikPen Max daily 20 units 15 mL 3  ? Insulin Pen Needle (BD PEN NEEDLE NANO U/F) 32G X 4 MM MISC 1 Device by Does not apply route 3 (three) times daily. 300 each 3  ? losartan (COZAAR) 100 MG tablet Take 100 mg by mouth daily.    ? metolazone (ZAROXOLYN) 5 MG tablet Take 5 mg by mouth daily. Monday,Wednesday, Friday as need for swelling    ? oxyCODONE-acetaminophen (PERCOCET) 5-325 MG tablet Take 1 tablet by mouth every 6 (six) hours as needed for severe pain. 8 tablet 0  ? pregabalin (LYRICA) 150 MG capsule Take 150 mg by mouth 3 (three) times daily.    ? ?No current facility-administered medications for this visit.  ? ? ?PHYSICAL EXAM ?There were no vitals filed for this visit. ? ? ?Constitutional: well appearing. no distress. Appears well nourished.  ?Neurologic: CN intact. no focal findings. no sensory loss. In wheelchair. ?Psychiatric:  Mood and affect symmetric and appropriate. ?Eyes:  No icterus. No conjunctival pallor. ?Ears, nose, throat:  mucous membranes moist. Midline trachea.  ?Cardiac: regular rate and rhythm.  ?Respiratory:  unlabored. ?Abdominal:  soft, non-tender, non-distended.  ?Peripheral vascular: 2+ radial pulses ?Extremity: no edema. no cyanosis. no pallor.  ?Skin: no gangrene. no ulceration.  ?Lymphatic: no Stemmer's sign. no palpable lymphadenopathy. ? ?PERTINENT LABORATORY AND RADIOLOGIC DATA ? ?Most recent CBC ? ?  Latest Ref Rng & Units 07/17/2021  ?  8:14 AM 07/03/2021  ?  8:14 AM 06/19/2021  ?  8:14 AM  ?CBC  ?Hemoglobin 13.0 - 17.0 g/dL 9.5   9.1   8.4    ?  ? ?Most recent CMP ? ?  Latest Ref Rng & Units 05/28/2021  ?  8:37 AM  04/18/2021  ?  9:57 AM 04/06/2021  ? 12:46 PM  ?CMP  ?Glucose 70 - 99 mg/dL 263   225   252    ?BUN 8 - 23 mg/dL 81   87   55    ?Creatinine 0.61 - 1.24 mg/dL 5.20   5.18   4.38    ?Sodium 135 - 145 mmol/L 134   134   137    ?Potassium 3.5 - 5.1 mmol/L 4.6   4.1  4.4    ?Chloride 98 - 111 mmol/L 105   99   102    ?CO2 22 - 32 mmol/L  23   22    ?Calcium 8.9 - 10.3 mg/dL  8.9   9.1    ?Total Protein 6.5 - 8.1 g/dL  7.2     ?Total Bilirubin 0.3 - 1.2 mg/dL  1.2     ?Alkaline Phos 38 - 126 U/L  61     ?AST 15 - 41 U/L  23     ?ALT 0 - 44 U/L  15     ? ? ?Renal function ?CrCl cannot be calculated (Patient's most recent lab result is older than the maximum 21 days allowed.). ? ?Hemoglobin A1C (%)  ?Date Value  ?06/13/2021 11.0 (A)  ? ?Hgb A1c MFr Bld (%)  ?Date Value  ?12/31/2020 8.0 (H)  ? ? ?LDL Cholesterol  ?Date Value Ref Range Status  ?06/26/2010 99 0 - 99 mg/dL Final  ?  Comment:  ?  See lab report for associated comment(s)  ?  ? ?Vascular Imaging: ?AVF duplex shows good result from fistula creaiton; mature fistula with good flow rate (2L/min).  ? ?Yevonne Aline. Stanford Breed, MD ?[facility] [place] ?Office Phone Number: [phone] ?07/21/2021 7:12 PM ? ?Total time spent on preparing this encounter including chart review, data review, collecting history, examining the patient, coordinating care for this established patient, 30 minutes.  ? ?Portions of this report may have been transcribed using voice recognition software.  Every effort has been made to ensure accuracy; however, inadvertent computerized transcription errors may still be present. ? ? ? ?

## 2021-07-21 NOTE — Progress Notes (Signed)
[facility] [place] ? ?ASSESSMENT / PLAN: ?Tyler Patterson is a 71 y.o. with CKD V s/p left first stage brachiobasilic arteriovenous fistula 05/28/21. Duplex today shows high quality fistula. Plan second stage transposition early next week as schedule allows.  ? ?CHIEF COMPLAINT: in need of dialysis access ? ?HISTORY OF PRESENT ILLNESS: ?Tyler Patterson is a 72 y.o. male who presents to clinic for evaluation of permanent dialysis access.  He is right-handed.  He has never had dialysis access before.  No history of central venous catheterization.  We long discussion about dialysis.  We discussed: different options for dialysis access including peritoneal dialysis and hemodialysis;  The expected treatment course of dialysis;  the need for maintenance of all dialysis access surgery. ? ?07/22/21: Patient returns to clinic s/p first stage basilic vein arteriovenous fistula. Duplex today shows good result from fistula creation. He does not yet need dialysis access. I offered him transposition to allow the fistula to be used as soon as possible. He is amenable.  ? ?Past Medical History:  ?Diagnosis Date  ? Alcohol abuse   ? Anemia   ? Chronic kidney disease   ? 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 Complication of anesthesia   ? patient 's sister reports that patient has not been able to walk patient had hernia in October 2022.  ? COPD (chronic obstructive pulmonary disease) ([place])   ? Depression   ? Diabetes mellitus   ? no insulin in >78mos  ? Headache 05/13/2013  ? Headache(784.0)   ? Hypertension   ? ? ?Past Surgical History:  ?Procedure Laterality Date  ? AV FISTULA PLACEMENT Left 05/28/2021  ? Procedure: LEFT BRACHIOBASILIC ARTERIOVENOUS FISTULA CREATION;  Surgeon: [name], MD;  Location: [facility] OR;  Service: Vascular;  Laterality: Left;  PERIPHERAL NERVE BLOCK  ? COLON SURGERY    ? Hemicolectomy for benign colon mass  ? HERNIA REPAIR    ? INCISIONAL HERNIA REPAIR N/A 01/14/2021  ? Procedure: REPAIR OF INCISIONAL  HERNIA WITH MESH;  Surgeon: Erroll Luna, MD;  Location: [place];  Service: General;  Laterality: N/A;  ? ? ?Family History  ?Problem Relation Age of Onset  ? Lung cancer Mother   ? Lung cancer Father   ? Diabetes Mellitus II Neg Hx   ? ? ?Social History  ? ?Socioeconomic History  ? Marital status: Married  ?  Spouse name: Not on file  ? Number of children: 0  ? Years of education: Not on file  ? Highest education level: Not on file  ?Occupational History  ? Not on file  ?Tobacco Use  ? Smoking status: Former  ?  Packs/day: 0.25  ?  Types: Cigars, Cigarettes  ?  Quit date: 02/18/2021  ?  Years since quitting: 0.4  ? Smokeless tobacco: Never  ?Vaping Use  ? Vaping Use: Never used  ?Substance and Sexual Activity  ? Alcohol use: Not Currently  ?  Comment: 05/26/21- doesnt want any-++  ? Drug use: No  ? Sexual activity: Not Currently  ?Other Topics Concern  ? Not on file  ?Social History Narrative  ? Lives in a two story home   ? Right Handed   ? Drinks no caffeine   ? ?Social Determinants of Health  ? ?Financial Resource Strain: Not on file  ?Food Insecurity: Not on file  ?Transportation Needs: Not on file  ?Physical Activity: Not on file  ?Stress: Not on file  ?Social Connections: Not on  file  ?Intimate Partner Violence: Not on file  ? ? ?Allergies  ?Allergen Reactions  ? Duloxetine   ?  Other reaction(s): Abdominal pain  ? Fiorinal [Butalbital-Aspirin-Caffeine] Swelling  ? Spironolactone   ?  Other reaction(s): Gynecomastia  ? ? ?Current Outpatient Medications  ?Medication Sig Dispense Refill  ? aspirin EC 81 MG tablet Take 81 mg by mouth daily. Swallow whole.    ? atorvastatin (LIPITOR) 40 MG tablet Take 20 mg by mouth daily.    ? carvedilol (COREG) 25 MG tablet Take 25 mg by mouth 2 (two) times daily with a meal.    ? Cholecalciferol (VITAMIN D3) 50 MCG (2000 UT) CAPS Take 1 capsule by mouth daily.    ? Continuous Blood Gluc Sensor (FREESTYLE LIBRE 2 SENSOR) MISC 1 Device by Does not apply route  every 14 (fourteen) days. 6 each 3  ? furosemide (LASIX) 40 MG tablet Take 40 mg by mouth 2 (two) times daily.    ? glucose blood (ONETOUCH VERIO) test strip 1 each by Other route 3 (three) times daily. Use as instructed 300 each 3  ? hydrALAZINE (APRESOLINE) 50 MG tablet Take 50 mg by mouth 3 (three) times daily.    ? insulin lispro (HUMALOG KWIKPEN) 100 UNIT/ML KwikPen Max daily 20 units 15 mL 3  ? Insulin Pen Needle (BD PEN NEEDLE NANO U/F) 32G X 4 MM MISC 1 Device by Does not apply route 3 (three) times daily. 300 each 3  ? losartan (COZAAR) 100 MG tablet Take 100 mg by mouth daily.    ? metolazone (ZAROXOLYN) 5 MG tablet Take 5 mg by mouth daily. Monday,Wednesday, Friday as need for swelling    ? oxyCODONE-acetaminophen (PERCOCET) 5-325 MG tablet Take 1 tablet by mouth every 6 (six) hours as needed for severe pain. 8 tablet 0  ? pregabalin (LYRICA) 150 MG capsule Take 150 mg by mouth 3 (three) times daily.    ? ?No current facility-administered medications for this visit.  ? ? ?PHYSICAL EXAM ?There were no vitals filed for this visit. ? ? ?Constitutional: well appearing. no distress. Appears well nourished.  ?Neurologic: CN intact. no focal findings. no sensory loss. In wheelchair. ?Psychiatric:  Mood and affect symmetric and appropriate. ?Eyes:  No icterus. No conjunctival pallor. ?Ears, nose, throat:  mucous membranes moist. Midline trachea.  ?Cardiac: regular rate and rhythm.  ?Respiratory:  unlabored. ?Abdominal:  soft, non-tender, non-distended.  ?Peripheral vascular: 2+ radial pulses ?Extremity: no edema. no cyanosis. no pallor.  ?Skin: no gangrene. no ulceration.  ?Lymphatic: no Stemmer's sign. no palpable lymphadenopathy. ? ?PERTINENT LABORATORY AND RADIOLOGIC DATA ? ?Most recent CBC ? ?  Latest Ref Rng & Units 07/17/2021  ?  8:14 AM 07/03/2021  ?  8:14 AM 06/19/2021  ?  8:14 AM  ?CBC  ?Hemoglobin 13.0 - 17.0 g/dL 9.5   9.1   8.4    ?  ? ?Most recent CMP ? ?  Latest Ref Rng & Units 05/28/2021  ?  8:37 AM  04/18/2021  ?  9:57 AM 04/06/2021  ? 12:46 PM  ?CMP  ?Glucose 70 - 99 mg/dL 263   225   252    ?BUN 8 - 23 mg/dL 81   87   55    ?Creatinine 0.61 - 1.24 mg/dL 5.20   5.18   4.38    ?Sodium 135 - 145 mmol/L 134   134   137    ?Potassium 3.5 - 5.1 mmol/L 4.6   4.1  4.4    ?Chloride 98 - 111 mmol/L 105   99   102    ?CO2 22 - 32 mmol/L  23   22    ?Calcium 8.9 - 10.3 mg/dL  8.9   9.1    ?Total Protein 6.5 - 8.1 g/dL  7.2     ?Total Bilirubin 0.3 - 1.2 mg/dL  1.2     ?Alkaline Phos 38 - 126 U/L  61     ?AST 15 - 41 U/L  23     ?ALT 0 - 44 U/L  15     ? ? ?Renal function ?CrCl cannot be calculated (Patient's most recent lab result is older than the maximum 21 days allowed.). ? ?Hemoglobin A1C (%)  ?Date Value  ?06/13/2021 11.0 (A)  ? ?Hgb A1c MFr Bld (%)  ?Date Value  ?12/31/2020 8.0 (H)  ? ? ?LDL Cholesterol  ?Date Value Ref Range Status  ?06/26/2010 99 0 - 99 mg/dL Final  ?  Comment:  ?  See lab report for associated comment(s)  ?  ? ?Vascular Imaging: ?AVF duplex shows good result from fistula creaiton; mature fistula with good flow rate (2L/min).  ? ?Yevonne Aline. Stanford Breed, MD ?[facility] [place] ?Office Phone Number: [phone] ?07/21/2021 7:12 PM ? ?Total time spent on preparing this encounter including chart review, data review, collecting history, examining the patient, coordinating care for this established patient, 30 minutes.  ? ?Portions of this report may have been transcribed using voice recognition software.  Every effort has been made to ensure accuracy; however, inadvertent computerized transcription errors may still be present. ? ? ? ?

## 2021-07-22 ENCOUNTER — Other Ambulatory Visit: Payer: Self-pay

## 2021-07-22 ENCOUNTER — Ambulatory Visit (HOSPITAL_COMMUNITY)
Admission: RE | Admit: 2021-07-22 | Discharge: 2021-07-22 | Disposition: A | Discharge status: Home / Self Care | Payer: Medicare Other | Source: Ambulatory Visit | Attending: Vascular Surgery | Admitting: Vascular Surgery

## 2021-07-22 ENCOUNTER — Ambulatory Visit (INDEPENDENT_AMBULATORY_CARE_PROVIDER_SITE_OTHER): Payer: Medicare Other | Admitting: Vascular Surgery

## 2021-07-22 ENCOUNTER — Encounter: Payer: Self-pay | Admitting: Vascular Surgery

## 2021-07-22 VITALS — BP 110/53 | HR 60 | Temp 97.9°F | Resp 20 | Ht 68.0 in | Wt 188.0 lb

## 2021-07-22 DIAGNOSIS — N185 Chronic kidney disease, stage 5: Secondary | ICD-10-CM | POA: Insufficient documentation

## 2021-07-25 ENCOUNTER — Encounter (HOSPITAL_COMMUNITY): Payer: Self-pay | Admitting: Vascular Surgery

## 2021-07-25 NOTE — Progress Notes (Signed)
Unable to reach patient via phone.  Left a detailed message on machine with instructions for DOS. ? ?TWO VISITORS ARE ALLOWED TO COME WITH YOU AND STAY IN THE SURGICAL WAITING ROOM ONLY DURING PRE OP AND PROCEDURE DAY OF SURGERY.  ? ?PCP - [facility] ?Cardiologist - n/a ?Neurology - Narda Amber, DO ? ?Chest x-ray - n/a ?EKG - 04/18/21 ?Stress Test - n/a ?ECHO - 08/19/11 ?Cardiac Cath - n/a ? ?ICD Pacemaker/Loop - n/a ? ?Sleep Study -  n/a ?CPAP - none ? ?Do not take Humalog Insulin on the morning of surgery unless your CBG is greater than 220 mg/dL.  If CBG >220 mg/dL, then you may take ? of your sliding scale (correction) dose of insulin. . ? ?If your blood sugar is less than 70 mg/dL, you will need to treat for low blood sugar: ?Treat a low blood sugar (less than 70 mg/dL) with ? cup of clear juice (cranberry or apple), 4 glucose tablets, OR glucose gel. ?Recheck blood sugar in 15 minutes after treatment (to make sure it is greater than 70 mg/dL). If your blood sugar is not greater than 70 mg/dL on recheck, call [phone] for further instructions. ? ?Anesthesia review: Yes ? ?STOP now taking any Aspirin (unless otherwise instructed by your surgeon), Aleve, Naproxen, Ibuprofen, Motrin, Advil, Goody's, BC's, all herbal medications, fish oil, and all vitamins.  ?

## 2021-07-28 ENCOUNTER — Encounter (HOSPITAL_COMMUNITY)
Admission: RE | Disposition: A | Discharge status: Home / Self Care | Payer: Self-pay | Source: Home / Self Care | Attending: Vascular Surgery

## 2021-07-28 ENCOUNTER — Other Ambulatory Visit: Payer: Self-pay

## 2021-07-28 ENCOUNTER — Ambulatory Visit (HOSPITAL_BASED_OUTPATIENT_CLINIC_OR_DEPARTMENT_OTHER): Payer: Medicare Other | Admitting: Physician Assistant

## 2021-07-28 ENCOUNTER — Ambulatory Visit (HOSPITAL_COMMUNITY): Payer: Medicare Other | Admitting: Physician Assistant

## 2021-07-28 ENCOUNTER — Encounter (HOSPITAL_COMMUNITY): Payer: Self-pay | Admitting: Vascular Surgery

## 2021-07-28 ENCOUNTER — Ambulatory Visit (HOSPITAL_COMMUNITY)
Admission: RE | Admit: 2021-07-28 | Discharge: 2021-07-28 | Disposition: A | Discharge status: Home / Self Care | Payer: Medicare Other | Attending: Vascular Surgery | Admitting: Vascular Surgery

## 2021-07-28 DIAGNOSIS — E1122 Type 2 diabetes mellitus with diabetic chronic kidney disease: Secondary | ICD-10-CM | POA: Insufficient documentation

## 2021-07-28 DIAGNOSIS — I120 Hypertensive chronic kidney disease with stage 5 chronic kidney disease or end stage renal disease: Secondary | ICD-10-CM | POA: Insufficient documentation

## 2021-07-28 DIAGNOSIS — Z9889 Other specified postprocedural states: Secondary | ICD-10-CM | POA: Insufficient documentation

## 2021-07-28 DIAGNOSIS — N185 Chronic kidney disease, stage 5: Secondary | ICD-10-CM

## 2021-07-28 DIAGNOSIS — I770 Arteriovenous fistula, acquired: Secondary | ICD-10-CM | POA: Diagnosis not present

## 2021-07-28 DIAGNOSIS — J449 Chronic obstructive pulmonary disease, unspecified: Secondary | ICD-10-CM | POA: Diagnosis not present

## 2021-07-28 DIAGNOSIS — N186 End stage renal disease: Secondary | ICD-10-CM | POA: Insufficient documentation

## 2021-07-28 DIAGNOSIS — Z794 Long term (current) use of insulin: Secondary | ICD-10-CM

## 2021-07-28 DIAGNOSIS — Z87891 Personal history of nicotine dependence: Secondary | ICD-10-CM | POA: Insufficient documentation

## 2021-07-28 DIAGNOSIS — D649 Anemia, unspecified: Secondary | ICD-10-CM | POA: Diagnosis not present

## 2021-07-28 DIAGNOSIS — D759 Disease of blood and blood-forming organs, unspecified: Secondary | ICD-10-CM | POA: Insufficient documentation

## 2021-07-28 HISTORY — PX: BASCILIC VEIN TRANSPOSITION: SHX5742

## 2021-07-28 LAB — POCT I-STAT, CHEM 8
BUN: >130 mg/dL — ABNORMAL HIGH (ref 8–23)
Calcium, Ion: 1.12 mmol/L — ABNORMAL LOW (ref 1.15–1.40)
Chloride: 108 mmol/L (ref 98–111)
Creatinine, Ser: 6.8 mg/dL — ABNORMAL HIGH (ref 0.61–1.24)
Glucose, Bld: 179 mg/dL — ABNORMAL HIGH (ref 70–99)
HCT: 30 % — ABNORMAL LOW (ref 39.0–52.0)
Hemoglobin: 10.2 g/dL — ABNORMAL LOW (ref 13.0–17.0)
Potassium: 4.9 mmol/L (ref 3.5–5.1)
Sodium: 136 mmol/L (ref 135–145)
TCO2: 18 mmol/L — ABNORMAL LOW (ref 22–32)

## 2021-07-28 LAB — GLUCOSE, CAPILLARY
Glucose-Capillary: 168 mg/dL — ABNORMAL HIGH (ref 70–99)
Glucose-Capillary: 168 mg/dL — ABNORMAL HIGH (ref 70–99)
Glucose-Capillary: 190 mg/dL — ABNORMAL HIGH (ref 70–99)

## 2021-07-28 SURGERY — TRANSPOSITION, VEIN, BASILIC
Anesthesia: Monitor Anesthesia Care | Site: Arm Upper | Laterality: Left

## 2021-07-28 MED ORDER — MIDAZOLAM HCL 2 MG/2ML IJ SOLN: 1.0000 mg | Freq: Once | INTRAMUSCULAR | Status: AC

## 2021-07-28 MED ORDER — MIDAZOLAM HCL 2 MG/2ML IJ SOLN
INTRAMUSCULAR | Status: AC
  Administered 2021-07-28: 1 mg via INTRAVENOUS
  Filled 2021-07-28: qty 2

## 2021-07-28 MED ORDER — PAPAVERINE HCL 30 MG/ML IJ SOLN
INTRAMUSCULAR | Status: AC
  Filled 2021-07-28: qty 2

## 2021-07-28 MED ORDER — CHLORHEXIDINE GLUCONATE 4 % EX LIQD: 60.0000 mL | Freq: Once | CUTANEOUS | Status: DC

## 2021-07-28 MED ORDER — LIDOCAINE-EPINEPHRINE (PF) 1 %-1:200000 IJ SOLN
INTRAMUSCULAR | Status: AC
  Filled 2021-07-28: qty 30

## 2021-07-28 MED ORDER — HEPARIN 6000 UNIT IRRIGATION SOLUTION
Status: DC | PRN
Start: 2021-07-28 — End: 2021-07-28
  Administered 2021-07-28: 1

## 2021-07-28 MED ORDER — HEPARIN 6000 UNIT IRRIGATION SOLUTION
Status: AC
  Filled 2021-07-28: qty 500

## 2021-07-28 MED ORDER — INSULIN ASPART 100 UNIT/ML IJ SOLN: 0.0000 [IU] | INTRAMUSCULAR | Status: DC | PRN

## 2021-07-28 MED ORDER — CEFAZOLIN SODIUM-DEXTROSE 2-4 GM/100ML-% IV SOLN
2.0000 g | INTRAVENOUS | Status: AC
  Administered 2021-07-28: 2 g via INTRAVENOUS
  Filled 2021-07-28: qty 100

## 2021-07-28 MED ORDER — OXYCODONE-ACETAMINOPHEN 5-325 MG PO TABS: 1.0000 | ORAL_TABLET | Freq: Four times a day (QID) | ORAL | 0 refills | Status: DC | PRN

## 2021-07-28 MED ORDER — PHENYLEPHRINE HCL-NACL 20-0.9 MG/250ML-% IV SOLN
INTRAVENOUS | Status: DC | PRN
  Administered 2021-07-28: 25 ug/min via INTRAVENOUS

## 2021-07-28 MED ORDER — FENTANYL CITRATE (PF) 250 MCG/5ML IJ SOLN
INTRAMUSCULAR | Status: AC
  Filled 2021-07-28: qty 5

## 2021-07-28 MED ORDER — FENTANYL CITRATE (PF) 100 MCG/2ML IJ SOLN: 50.0000 ug | Freq: Once | INTRAMUSCULAR | Status: AC

## 2021-07-28 MED ORDER — CHLORHEXIDINE GLUCONATE 0.12 % MT SOLN
15.0000 mL | Freq: Once | OROMUCOSAL | Status: AC
  Administered 2021-07-28: 15 mL via OROMUCOSAL
  Filled 2021-07-28: qty 15

## 2021-07-28 MED ORDER — FENTANYL CITRATE (PF) 100 MCG/2ML IJ SOLN
INTRAMUSCULAR | Status: AC
  Administered 2021-07-28: 50 ug via INTRAVENOUS
  Filled 2021-07-28: qty 2

## 2021-07-28 MED ORDER — ONDANSETRON HCL 4 MG/2ML IJ SOLN
INTRAMUSCULAR | Status: DC | PRN
  Administered 2021-07-28: 4 mg via INTRAVENOUS

## 2021-07-28 MED ORDER — PROPOFOL 500 MG/50ML IV EMUL
INTRAVENOUS | Status: DC | PRN
  Administered 2021-07-28: 100 ug/kg/min via INTRAVENOUS

## 2021-07-28 MED ORDER — ACETAMINOPHEN 10 MG/ML IV SOLN: 1000.0000 mg | Freq: Once | INTRAVENOUS | Status: DC | PRN

## 2021-07-28 MED ORDER — FENTANYL CITRATE (PF) 100 MCG/2ML IJ SOLN: 25.0000 ug | INTRAMUSCULAR | Status: DC | PRN

## 2021-07-28 MED ORDER — HEPARIN SODIUM (PORCINE) 1000 UNIT/ML IJ SOLN
INTRAMUSCULAR | Status: DC | PRN
  Administered 2021-07-28: 5000 [IU] via INTRAVENOUS

## 2021-07-28 MED ORDER — SODIUM CHLORIDE 0.9 % IV SOLN
INTRAVENOUS | Status: DC
Start: 2021-07-28 — End: 2021-07-28

## 2021-07-28 MED ORDER — ORAL CARE MOUTH RINSE: 15.0000 mL | Freq: Once | OROMUCOSAL | Status: AC

## 2021-07-28 MED ORDER — SODIUM CHLORIDE 0.9 % IV SOLN: INTRAVENOUS | Status: DC

## 2021-07-28 MED ORDER — MIDAZOLAM HCL 2 MG/2ML IJ SOLN
INTRAMUSCULAR | Status: AC
  Filled 2021-07-28: qty 2

## 2021-07-28 MED ORDER — MEPIVACAINE HCL (PF) 2 % IJ SOLN
INTRAMUSCULAR | Status: DC | PRN
  Administered 2021-07-28: 20 mL

## 2021-07-28 MED ORDER — PHENYLEPHRINE 80 MCG/ML (10ML) SYRINGE FOR IV PUSH (FOR BLOOD PRESSURE SUPPORT)
PREFILLED_SYRINGE | INTRAVENOUS | Status: DC | PRN
  Administered 2021-07-28: 80 ug via INTRAVENOUS

## 2021-07-28 MED ORDER — 0.9 % SODIUM CHLORIDE (POUR BTL) OPTIME
TOPICAL | Status: DC | PRN
Start: 2021-07-28 — End: 2021-07-28
  Administered 2021-07-28: 1000 mL

## 2021-07-28 SURGICAL SUPPLY — 32 items
ADH SKN CLS APL DERMABOND .7 (GAUZE/BANDAGES/DRESSINGS) ×1
APL PRP STRL LF DISP 70% ISPRP (MISCELLANEOUS) ×1
ARMBAND PINK RESTRICT EXTREMIT (MISCELLANEOUS) ×2 IMPLANT
CANISTER SUCT 3000ML PPV (MISCELLANEOUS) ×2 IMPLANT
CHLORAPREP W/TINT 26 (MISCELLANEOUS) ×2 IMPLANT
CLIP LIGATING EXTRA MED SLVR (CLIP) ×2 IMPLANT
CLIP LIGATING EXTRA SM BLUE (MISCELLANEOUS) ×2 IMPLANT
COVER PROBE W GEL 5X96 (DRAPES) ×3 IMPLANT
DERMABOND ADVANCED (GAUZE/BANDAGES/DRESSINGS) ×1
DERMABOND ADVANCED .7 DNX12 (GAUZE/BANDAGES/DRESSINGS) ×1 IMPLANT
ELECT REM PT RETURN 9FT ADLT (ELECTROSURGICAL) ×2
ELECTRODE REM PT RTRN 9FT ADLT (ELECTROSURGICAL) ×1 IMPLANT
GLOVE BIO SURGEON STRL SZ8 (GLOVE) ×2 IMPLANT
GOWN STRL REUS W/ TWL LRG LVL3 (GOWN DISPOSABLE) ×2 IMPLANT
GOWN STRL REUS W/ TWL XL LVL3 (GOWN DISPOSABLE) ×1 IMPLANT
GOWN STRL REUS W/TWL LRG LVL3 (GOWN DISPOSABLE) ×4
GOWN STRL REUS W/TWL XL LVL3 (GOWN DISPOSABLE) ×2
KIT BASIN OR (CUSTOM PROCEDURE TRAY) ×2 IMPLANT
KIT TURNOVER KIT B (KITS) ×2 IMPLANT
NS IRRIG 1000ML POUR BTL (IV SOLUTION) ×2 IMPLANT
PACK CV ACCESS (CUSTOM PROCEDURE TRAY) ×2 IMPLANT
PAD ARMBOARD 7.5X6 YLW CONV (MISCELLANEOUS) ×4 IMPLANT
SLING ARM FOAM STRAP LRG (SOFTGOODS) IMPLANT
SLING ARM FOAM STRAP MED (SOFTGOODS) ×1 IMPLANT
SUT MNCRL AB 4-0 PS2 18 (SUTURE) ×3 IMPLANT
SUT PROLENE 6 0 BV (SUTURE) ×6 IMPLANT
SUT SILK 2 0 SH (SUTURE) ×1 IMPLANT
SUT VIC AB 3-0 SH 27 (SUTURE) ×4
SUT VIC AB 3-0 SH 27X BRD (SUTURE) ×1 IMPLANT
TOWEL GREEN STERILE (TOWEL DISPOSABLE) ×2 IMPLANT
UNDERPAD 30X36 HEAVY ABSORB (UNDERPADS AND DIAPERS) ×2 IMPLANT
WATER STERILE IRR 1000ML POUR (IV SOLUTION) ×2 IMPLANT

## 2021-07-28 NOTE — Op Note (Signed)
DATE OF SERVICE: 07/28/2021 ? ?PATIENT:  Tyler Patterson  71 y.o. male ? ?PRE-OPERATIVE DIAGNOSIS:  CKD V ? ?POST-OPERATIVE DIAGNOSIS:  Same ? ?PROCEDURE:   ?Left arm second stage basilic vein transposition ? ?SURGEON:  Surgeon(s) and Role: ?   * [name], MD - Primary ? ?ASSISTANT: Lestine Box, RNFA ? ?ANESTHESIA:   regional and MAC ? ?EBL: minimal ? ?BLOOD ADMINISTERED:none ? ?DRAINS: none  ? ?LOCAL MEDICATIONS USED:  NONE ? ?SPECIMEN:  none ? ?COUNTS: confirmed correct. ? ?TOURNIQUET:  none ? ?PATIENT DISPOSITION:  PACU - hemodynamically stable. ?  ?Delay start of Pharmacological VTE agent (>24hrs) due to surgical blood loss or risk of bleeding: no ? ?INDICATION FOR PROCEDURE: [name] is a 71 y.o. male with chronic kidney disease, stage five in need of permanent dialysis access. I created a brachiobasilic arteriovenous fistula for him 05/28/21. The fistula has matured nicely. After careful discussion of risks, benefits, and alternatives the patient was offered transposition.  The patient understood and wished to proceed. ? ?OPERATIVE FINDINGS: healthy brachiobasilic fistula has matured nicely. Good result from transposition. ? ?DESCRIPTION OF PROCEDURE: After identification of the patient in the pre-operative holding area, the patient was transferred to the operating room. The patient was positioned supine on the operating room table. Anesthesia was induced. The left arm was prepped and draped in standard fashion. A surgical pause was performed confirming correct patient, procedure, and operative location. ? ?Using intraoperative ultrasound the course of the left basilic vein was marked on the skin. The antecubital incision was reopened. 3 skip incisions were made over the course of the basilic vein fistula.  These were carried down through subcutaneous tissue until the fistula was encountered.  The fascia was skeletonized from the axilla to the anastomosis, taking care to ligate and divide sidebranches,  and to protect the medial antebrachial cutaneous nerve.  ? ?A subcutaneous tunnel was created over the biceps using a sheath tunneling device.  Patient was heparinized.  The fistula near the anastomosis was clamped.  The outflow in the axilla was clamped.  The fistula was divided.  The fistula was marked to ensure no twisting or kinking while delivering the fistula through the tunnel.  The fistula was tunneled through the arm and delivered near the previous anastomosis. ? ?The fistula was spatulated proximally and distally.  The fistula was reanastomosed end-to-end using continuous running suture of 5-0 Prolene.  A palpable thrill was felt over the subcutaneous course of the tunnel.  Doppler flow was excellent in the proximal and distal fistula. ? ?The wounds were copiously irrigated.  Hemostasis was ensured in the surgical bed.  The wounds were closed in layers using 3-0 Vicryl and 4-0 Monocryl. ? ?Upon completion of the case instrument and sharps counts were confirmed correct. The patient was transferred to the PACU in good condition. I was present for all portions of the procedure. ? ?Yevonne Aline. Stanford Breed, MD ?[facility] [place] ?Office Phone Number: [phone] ?07/28/2021 11:50 AM ? ? ? ?

## 2021-07-28 NOTE — Anesthesia Preprocedure Evaluation (Addendum)
Anesthesia Evaluation  ?Patient identified by MRN, date of birth, ID band ?Patient awake ? ? ? ?Reviewed: ?Allergy & Precautions, NPO status , Patient's Chart, lab work & pertinent test results ? ?Airway ?Mallampati: II ? ?TM Distance: >3 FB ?Neck ROM: Full ? ? ? Dental ? ?(+) Edentulous Upper, Edentulous Lower ?  ?Pulmonary ?COPD, former smoker,  ?  ?Pulmonary exam normal ? ? ? ? ? ? ? Cardiovascular ?hypertension, Pt. on medications and Pt. on home beta blockers ? ?Rhythm:Regular Rate:Normal ? ? ?  ?Neuro/Psych ? Headaches, Seizures -,    ? GI/Hepatic ?Neg liver ROS, GERD  ,  ?Endo/Other  ?diabetes, Type 2, Insulin Dependent ? Renal/GU ?ESRF and DialysisRenal disease  ?negative genitourinary ?  ?Musculoskeletal ?negative musculoskeletal ROS ?(+)  ? Abdominal ?Normal abdominal exam  (+)   ?Peds ? Hematology ? ?(+) Blood dyscrasia, anemia ,   ?Anesthesia Other Findings ? ? Reproductive/Obstetrics ? ?  ? ? ? ? ? ? ? ? ? ? ? ? ? ?  ?  ? ? ? ? ? ? ? ?Anesthesia Physical ?Anesthesia Plan ? ?ASA: 3 ? ?Anesthesia Plan: MAC and Regional  ? ?Post-op Pain Management: Regional block*  ? ?Induction: Intravenous ? ?PONV Risk Score and Plan: 1 and Ondansetron, Dexamethasone, Propofol infusion and Treatment may vary due to age or medical condition ? ?Airway Management Planned: Simple Face Mask, Natural Airway and Nasal Cannula ? ?Additional Equipment: None ? ?Intra-op Plan:  ? ?Post-operative Plan:  ? ?Informed Consent: I have reviewed the patients History and Physical, chart, labs and discussed the procedure including the risks, benefits and alternatives for the proposed anesthesia with the patient or authorized representative who has indicated his/her understanding and acceptance.  ? ? ? ?Dental advisory given ? ?Plan Discussed with:  ? ?Anesthesia Plan Comments:   ? ? ? ? ? ? ?Anesthesia Quick Evaluation ? ?

## 2021-07-28 NOTE — Discharge Instructions (Signed)
° °  [facility] [place] ° °Discharge Instructions ° °AV Fistula or Graft Surgery for Dialysis Access ° °Please refer to the following instructions for your post-procedure care. Your surgeon or physician assistant will discuss any changes with you. ° °Activity ° °You may drive the day following your surgery, if you are comfortable and no longer taking prescription pain medication. Resume full activity as the soreness in your incision resolves. ° °Bathing/Showering ° °You may shower after you go home. Keep your incision dry for 48 hours. Do not soak in a bathtub, hot tub, or swim until the incision heals completely. You may not shower if you have a hemodialysis catheter. ° °Incision Care ° °Clean your incision with mild soap and water after 48 hours. Pat the area dry with a clean towel. You do not need a bandage unless otherwise instructed. Do not apply any ointments or creams to your incision. You may have skin glue on your incision. Do not peel it off. It will come off on its own in about one week. Your arm may swell a bit after surgery. To reduce swelling use pillows to elevate your arm so it is above your heart. Your doctor will tell you if you need to lightly wrap your arm with an ACE bandage. ° °Diet ° °Resume your normal diet. There are not special food restrictions following this procedure. In order to heal from your surgery, it is CRITICAL to get adequate nutrition. Your body requires vitamins, minerals, and protein. Vegetables are the best source of vitamins and minerals. Vegetables also provide the perfect balance of protein. Processed food has little nutritional value, so try to avoid this. ° °Medications ° °Resume taking all of your medications. If your incision is causing pain, you may take over-the counter pain relievers such as acetaminophen (Tylenol). If you were prescribed a stronger pain medication, please be aware these medications can cause nausea and constipation. Prevent  nausea by taking the medication with a snack or meal. Avoid constipation by drinking plenty of fluids and eating foods with high amount of fiber, such as fruits, vegetables, and grains. Do not take Tylenol if you are taking prescription pain medications. ° ° ° ° °Follow up °Your surgeon may want to see you in the office following your access surgery. If so, this will be arranged at the time of your surgery. ° °Please call us immediately for any of the following conditions: ° °Increased pain, redness, drainage (pus) from your incision site °Fever of 101 degrees or higher °Severe or worsening pain at your incision site °Hand pain or numbness. ° °Reduce your risk of vascular disease: ° °Stop smoking. If you would like help, call [facility] at 1-800-QUIT-NOW (1-800-784-8669) or [place] at [phone] ° °Manage your cholesterol °Maintain a desired weight °Control your diabetes °Keep your blood pressure down ° °Dialysis ° °It will take several weeks to several months for your new dialysis access to be ready for use. Your surgeon will determine when it is OK to use it. Your nephrologist will continue to direct your dialysis. You can continue to use your Permcath until your new access is ready for use. ° °If you have any questions, please call the office at [phone]. ° °

## 2021-07-28 NOTE — Interval H&P Note (Signed)
History and Physical Interval Note: ? ?07/28/2021 ?7:27 AM ? ?Tyler Patterson  has presented today for surgery, with the diagnosis of CKD V.  The various methods of treatment have been discussed with the patient and family. After consideration of risks, benefits and other options for treatment, the patient has consented to  Procedure(s) with comments: ?SECOND STAGE BASILIC VEIN FISTULA (Left) - PERIPHERAL NERVE BLOCK as a surgical intervention.  The patient's history has been reviewed, patient examined, no change in status, stable for surgery.  I have reviewed the patient's chart and labs.  Questions were answered to the patient's satisfaction.   ? ? ?[name] ? ? ?

## 2021-07-28 NOTE — Anesthesia Postprocedure Evaluation (Signed)
Anesthesia Post Note ? ?Patient: Tyler Patterson ? ?Procedure(s) Performed: SECOND STAGE BASILIC VEIN FISTULA (Left: Arm Upper) ? ?  ? ?Patient location during evaluation: PACU ?Anesthesia Type: General ?Level of consciousness: awake ?Pain management: pain level controlled ?Respiratory status: spontaneous breathing ?Cardiovascular status: stable ?Postop Assessment: no apparent nausea or vomiting ?Anesthetic complications: no ? ? ?No notable events documented. ? ?Last Vitals:  ?Vitals:  ? 07/28/21 1253 07/28/21 1308  ?BP: 137/75 (!) 143/84  ?Pulse: (!) 58 (!) 58  ?Resp: 10 13  ?Temp:  (!) 36.2 ?C  ?SpO2: 100% 100%  ?  ?Last Pain:  ?Vitals:  ? 07/28/21 1308  ?TempSrc:   ?PainSc: 0-No pain  ? ? ?  ?  ?  ?  ?  ?  ? ?Deshanna Kama ? ? ? ? ?

## 2021-07-28 NOTE — Transfer of Care (Signed)
Immediate Anesthesia Transfer of Care Note ? ?Patient: Tyler Patterson ? ?Procedure(s) Performed: SECOND STAGE BASILIC VEIN FISTULA (Left: Arm Upper) ? ?Patient Location: PACU ? ?Anesthesia Type:MAC and Regional ? ?Level of Consciousness: drowsy ? ?Airway & Oxygen Therapy: Patient Spontanous Breathing and Patient connected to face mask oxygen ? ?Post-op Assessment: Report given to RN and Post -op Vital signs reviewed and stable ? ?Post vital signs: Reviewed and stable ? ?Last Vitals:  ?Vitals Value Taken Time  ?BP 109/54 07/28/21 1153  ?Temp    ?Pulse 66 07/28/21 1154  ?Resp 7 07/28/21 1154  ?SpO2 100 % 07/28/21 1154  ?Vitals shown include unvalidated device data. ? ?Last Pain:  ?Vitals:  ? 07/28/21 0718  ?TempSrc:   ?PainSc: 0-No pain  ?   ? ?  ? ?Complications: No notable events documented. ?

## 2021-07-28 NOTE — Anesthesia Procedure Notes (Signed)
Anesthesia Regional Block: Supraclavicular block  ? ?Pre-Anesthetic Checklist: , timeout performed,  Correct Patient, Correct Site, Correct Laterality,  Correct Procedure, Correct Position, site marked,  Risks and benefits discussed,  Surgical consent,  Pre-op evaluation,  At surgeon's request and post-op pain management ? ?Laterality: Left ? ?Prep: Dura Prep     ?  ?Needles:  ?Injection technique: Single-shot ? ?Needle Type: Echogenic Stimulator Needle   ? ? ?Needle Length: 5cm  ?Needle Gauge: 20  ? ? ? ?Additional Needles: ? ? ?Procedures:,,,, ultrasound used (permanent image in chart),,    ?Narrative:  ?Start time: 07/28/2021 9:10 AM ?End time: 07/28/2021 9:14 AM ?Injection made incrementally with aspirations every 5 mL. ? ?Performed by: Personally  ?Anesthesiologist: Darral Dash, DO ? ?Additional Notes: ?Patient identified. Risks/Benefits/Options discussed with patient including but not limited to bleeding, infection, nerve damage, failed block, incomplete pain control. Patient expressed understanding and wished to proceed. All questions were answered. Sterile technique was used throughout the entire procedure. Please see nursing notes for vital signs. Aspirated in 5cc intervals with injection for negative confirmation. Patient was given instructions on fall risk and not to get out of bed. All questions and concerns addressed with instructions to call with any issues or inadequate analgesia.   ?  ? ? ? ? ?

## 2021-07-29 ENCOUNTER — Encounter (HOSPITAL_COMMUNITY): Payer: Self-pay | Admitting: Vascular Surgery

## 2021-07-29 NOTE — Addendum Note (Signed)
Addendum  created 07/29/21 1433 by Darral Dash, DO  ? Intraprocedure Staff edited  ?  ?

## 2021-07-31 ENCOUNTER — Encounter (HOSPITAL_COMMUNITY)
Admission: RE | Admit: 2021-07-31 | Discharge: 2021-07-31 | Disposition: A | Discharge status: Home / Self Care | Payer: Medicare Other | Source: Ambulatory Visit | Attending: Nephrology | Admitting: Nephrology

## 2021-07-31 VITALS — BP 114/52 | HR 66 | Temp 97.1°F | Resp 16

## 2021-07-31 DIAGNOSIS — D631 Anemia in chronic kidney disease: Secondary | ICD-10-CM | POA: Diagnosis present

## 2021-07-31 DIAGNOSIS — N184 Chronic kidney disease, stage 4 (severe): Secondary | ICD-10-CM | POA: Insufficient documentation

## 2021-07-31 LAB — POCT HEMOGLOBIN-HEMACUE: Hemoglobin: 9.8 g/dL — ABNORMAL LOW (ref 13.0–17.0)

## 2021-07-31 MED ORDER — EPOETIN ALFA-EPBX 10000 UNIT/ML IJ SOLN
INTRAMUSCULAR | Status: AC
  Administered 2021-07-31: 10000 [IU] via SUBCUTANEOUS
  Filled 2021-07-31: qty 1

## 2021-07-31 MED ORDER — EPOETIN ALFA-EPBX 10000 UNIT/ML IJ SOLN: 10000.0000 [IU] | INTRAMUSCULAR | Status: DC

## 2021-08-14 ENCOUNTER — Encounter (HOSPITAL_COMMUNITY)
Admission: RE | Admit: 2021-08-14 | Discharge: 2021-08-14 | Disposition: A | Discharge status: Home / Self Care | Payer: Medicare Other | Source: Ambulatory Visit | Attending: Nephrology | Admitting: Nephrology

## 2021-08-14 VITALS — BP 119/52 | HR 59 | Resp 18

## 2021-08-14 DIAGNOSIS — N184 Chronic kidney disease, stage 4 (severe): Secondary | ICD-10-CM

## 2021-08-14 LAB — POCT HEMOGLOBIN-HEMACUE: Hemoglobin: 9.2 g/dL — ABNORMAL LOW (ref 13.0–17.0)

## 2021-08-14 LAB — FERRITIN: Ferritin: 239 ng/mL (ref 24–336)

## 2021-08-14 LAB — IRON AND TIBC
Iron: 47 ug/dL (ref 45–182)
Saturation Ratios: 16 % — ABNORMAL LOW (ref 17.9–39.5)
TIBC: 286 ug/dL (ref 250–450)
UIBC: 239 ug/dL

## 2021-08-14 MED ORDER — EPOETIN ALFA-EPBX 10000 UNIT/ML IJ SOLN
INTRAMUSCULAR | Status: AC
Start: 2021-08-14 — End: 2021-08-14
  Filled 2021-08-14: qty 1

## 2021-08-14 MED ORDER — EPOETIN ALFA-EPBX 10000 UNIT/ML IJ SOLN
10000.0000 [IU] | INTRAMUSCULAR | Status: DC
  Administered 2021-08-14: 10000 [IU] via SUBCUTANEOUS

## 2021-08-21 ENCOUNTER — Other Ambulatory Visit: Payer: Self-pay | Admitting: *Deleted

## 2021-08-21 DIAGNOSIS — N185 Chronic kidney disease, stage 5: Secondary | ICD-10-CM

## 2021-08-28 ENCOUNTER — Encounter (HOSPITAL_COMMUNITY)
Admission: RE | Admit: 2021-08-28 | Discharge: 2021-08-28 | Disposition: A | Discharge status: Home / Self Care | Payer: Medicare Other | Source: Ambulatory Visit | Attending: Nephrology | Admitting: Nephrology

## 2021-08-28 VITALS — BP 105/50 | HR 62 | Temp 97.1°F | Resp 18

## 2021-08-28 DIAGNOSIS — N184 Chronic kidney disease, stage 4 (severe): Secondary | ICD-10-CM | POA: Insufficient documentation

## 2021-08-28 DIAGNOSIS — D631 Anemia in chronic kidney disease: Secondary | ICD-10-CM | POA: Diagnosis present

## 2021-08-28 LAB — POCT HEMOGLOBIN-HEMACUE: Hemoglobin: 11.5 g/dL — ABNORMAL LOW (ref 13.0–17.0)

## 2021-08-28 MED ORDER — EPOETIN ALFA-EPBX 10000 UNIT/ML IJ SOLN
10000.0000 [IU] | INTRAMUSCULAR | Status: DC
  Administered 2021-08-28: 10000 [IU] via SUBCUTANEOUS

## 2021-08-28 MED ORDER — EPOETIN ALFA-EPBX 10000 UNIT/ML IJ SOLN
INTRAMUSCULAR | Status: AC
  Filled 2021-08-28: qty 1

## 2021-09-02 ENCOUNTER — Encounter (HOSPITAL_COMMUNITY): Payer: Medicare Other

## 2021-09-02 ENCOUNTER — Encounter: Payer: Medicare Other | Admitting: Vascular Surgery

## 2021-09-11 ENCOUNTER — Encounter (HOSPITAL_COMMUNITY)
Admission: RE | Admit: 2021-09-11 | Discharge: 2021-09-11 | Disposition: A | Discharge status: Home / Self Care | Payer: Medicare Other | Source: Ambulatory Visit | Attending: Nephrology | Admitting: Nephrology

## 2021-09-11 ENCOUNTER — Other Ambulatory Visit: Payer: Self-pay | Admitting: Internal Medicine

## 2021-09-11 VITALS — BP 127/55 | HR 64 | Temp 96.7°F | Resp 18

## 2021-09-11 DIAGNOSIS — D631 Anemia in chronic kidney disease: Secondary | ICD-10-CM

## 2021-09-11 DIAGNOSIS — N184 Chronic kidney disease, stage 4 (severe): Secondary | ICD-10-CM | POA: Diagnosis not present

## 2021-09-11 LAB — IRON AND TIBC
Iron: 62 ug/dL (ref 45–182)
Saturation Ratios: 21 % (ref 17.9–39.5)
TIBC: 298 ug/dL (ref 250–450)
UIBC: 236 ug/dL

## 2021-09-11 LAB — FERRITIN: Ferritin: 317 ng/mL (ref 24–336)

## 2021-09-11 LAB — POCT HEMOGLOBIN-HEMACUE: Hemoglobin: 11.7 g/dL — ABNORMAL LOW (ref 13.0–17.0)

## 2021-09-11 MED ORDER — EPOETIN ALFA-EPBX 10000 UNIT/ML IJ SOLN
INTRAMUSCULAR | Status: AC
  Filled 2021-09-11: qty 1

## 2021-09-11 MED ORDER — EPOETIN ALFA-EPBX 10000 UNIT/ML IJ SOLN
10000.0000 [IU] | INTRAMUSCULAR | Status: DC
  Administered 2021-09-11: 10000 [IU] via SUBCUTANEOUS

## 2021-09-15 ENCOUNTER — Ambulatory Visit (HOSPITAL_COMMUNITY)
Admission: RE | Admit: 2021-09-15 | Discharge: 2021-09-15 | Disposition: A | Discharge status: Home / Self Care | Payer: Medicare Other | Source: Ambulatory Visit | Attending: Vascular Surgery | Admitting: Vascular Surgery

## 2021-09-15 DIAGNOSIS — N185 Chronic kidney disease, stage 5: Secondary | ICD-10-CM | POA: Insufficient documentation

## 2021-09-15 NOTE — Progress Notes (Signed)
[facility]  ASSESSMENT / PLAN: Tyler Patterson is a 71 y.o. with CKD V s/p left second stage brachiobasilic arteriovenous fistula 07/28/21. Duplex today shows high quality fistula. OK to use fistula whenever necessary for dialysis. Follow up with me as needed.  CHIEF COMPLAINT: in need of dialysis access  HISTORY OF PRESENT ILLNESS: Tyler Patterson is a 71 y.o. male who presents to clinic for evaluation of permanent dialysis access.  He is right-handed.  He has never had dialysis access before.  No history of central venous catheterization.  We long discussion about dialysis.  We discussed: different options for dialysis access including peritoneal dialysis and hemodialysis;  The expected treatment course of dialysis;  the need for maintenance of all dialysis access surgery.  07/22/21: Patient returns to clinic s/p first stage basilic vein arteriovenous fistula. Duplex today shows good result from fistula creation. He does not yet need dialysis access. I offered him transposition to allow the fistula to be used as soon as possible. He is amenable.   09/15/21: returns for postoperative check. Fistula working well. Not yet requiring dialysis.   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   COPD (chronic obstructive pulmonary disease) ([place])    Depression    Diabetes mellitus    type 2   Headache 05/13/2013   Headache(784.0)    Hypertension     Past Surgical History:  Procedure Laterality Date   AV FISTULA PLACEMENT Left 05/28/2021   Procedure: LEFT BRACHIOBASILIC ARTERIOVENOUS FISTULA CREATION;  Surgeon: [name], MD;  Location: [facility];  Service: Vascular;  Laterality: Left;  PERIPHERAL NERVE BLOCK   [place] Left 07/28/2021   Procedure:  SECOND STAGE BASILIC VEIN FISTULA;  Surgeon: [name], MD;  Location: [facility];  Service: Vascular;  Laterality: Left;  PERIPHERAL NERVE BLOCK   COLON SURGERY     Hemicolectomy for benign colon mass   HERNIA REPAIR     INCISIONAL HERNIA REPAIR N/A 01/14/2021   Procedure: REPAIR OF INCISIONAL HERNIA WITH MESH;  Surgeon: Erroll Luna, MD;  Location: [facility];  Service: General;  Laterality: N/A;    Family History  Problem Relation Age of Onset   Lung cancer Mother    Lung cancer Father    Diabetes Mellitus II Neg Hx     Social History   Socioeconomic History   Marital status: Married    Spouse name: Not on file   Number of children: 0   Years of education: Not on file   Highest education level: Not on file  Occupational History   Not on file  Tobacco Use   Smoking status: Former    Packs/day: 0.25    Types: Cigars, Cigarettes    Quit date: 02/18/2021    Years since quitting: 0.5   Smokeless tobacco: Never  Vaping Use   Vaping Use: Never used  Substance and Sexual Activity   Alcohol use: Not Currently    Comment: 05/26/21- doesnt want any-++   Drug use: No   Sexual activity: Not Currently  Other Topics Concern   Not on file  Social History Narrative   Lives in a two story home    Right  Handed    Drinks no caffeine    Social Determinants of Radio broadcast assistant Strain: Not on file  Food Insecurity: Not on file  Transportation Needs: Not on file  Physical Activity: Not on file  Stress: Not on file  Social Connections: Not on file  Intimate Partner Violence: Not on file    Allergies  Allergen Reactions   Duloxetine     Other reaction(s): Abdominal pain   Fiorinal [Butalbital-Aspirin-Caffeine] Swelling   Spironolactone     Other reaction(s): Gynecomastia    Current Outpatient Medications  Medication Sig Dispense Refill   aspirin EC 81 MG tablet Take 81 mg by mouth daily. Swallow whole.     atorvastatin (LIPITOR) 40 MG tablet  Take 20 mg by mouth daily.     carvedilol (COREG) 25 MG tablet Take 25 mg by mouth 2 (two) times daily with a meal.     Cholecalciferol (VITAMIN D3) 50 MCG (2000 UT) CAPS Take 2,000 Units by mouth daily.     Continuous Blood Gluc Sensor (FREESTYLE LIBRE 2 SENSOR) MISC 1 Device by Does not apply route every 14 (fourteen) days. 6 each 3   furosemide (LASIX) 40 MG tablet Take 40 mg by mouth 2 (two) times daily.     glucose blood (ONETOUCH VERIO) test strip 1 each by Other route 3 (three) times daily. Use as instructed 300 each 3   hydrALAZINE (APRESOLINE) 50 MG tablet Take 50 mg by mouth 3 (three) times daily.     insulin lispro (HUMALOG KWIKPEN) 100 UNIT/ML KwikPen Max daily 20 units (Patient taking differently: 1-5 Units 3 (three) times daily. Sliding scale Max daily 20 units) 15 mL 3   Insulin Pen Needle (BD PEN NEEDLE NANO U/F) 32G X 4 MM MISC 1 Device by Does not apply route 3 (three) times daily. 300 each 3   losartan (COZAAR) 100 MG tablet Take 100 mg by mouth daily.     metolazone (ZAROXOLYN) 5 MG tablet Take 5 mg by mouth See admin instructions. Monday,Wednesday, Friday as need for swelling     oxyCODONE-acetaminophen (PERCOCET) 5-325 MG tablet Take 1 tablet by mouth every 6 (six) hours as needed for severe pain. 20 tablet 0   pregabalin (LYRICA) 150 MG capsule Take 150 mg by mouth 2 (two) times daily.     No current facility-administered medications for this visit.    PHYSICAL EXAM Vitals:   09/16/21 0947  BP: (!) 153/67  Pulse: 63  Resp: 20  Temp: 98.6 F (37 C)  SpO2: 100%  Weight: 192 lb (87.1 kg)  Height: 5\' 8"  (1.727 m)     Constitutional: well appearing. no distress. Appears well nourished.  Neurologic: CN intact. no focal findings. no sensory loss. In wheelchair. Psychiatric:  Mood and affect symmetric and appropriate. Eyes:  No icterus. No conjunctival pallor. Ears, nose, throat:  mucous membranes moist. Midline trachea.  Cardiac: regular rate and rhythm.   Respiratory:  unlabored. Abdominal:  soft, non-tender, non-distended.  Peripheral vascular: 2+ radial pulses Extremity: no edema. no cyanosis. no pallor.  Skin: no gangrene. no ulceration.  Lymphatic: no Stemmer's sign. no palpable lymphadenopathy.  PERTINENT LABORATORY AND RADIOLOGIC DATA  Most recent CBC    Latest Ref Rng & Units 09/11/2021    8:22 AM 08/28/2021    8:21 AM 08/14/2021    8:11 AM  CBC  Hemoglobin 13.0 - 17.0 g/dL 11.7  11.5  9.2      Most recent CMP    Latest  Ref Rng & Units 07/28/2021    7:14 AM 05/28/2021    8:37 AM 04/18/2021    9:57 AM  CMP  Glucose 70 - 99 mg/dL 179  263  225   BUN 8 - 23 mg/dL >130  81  87   Creatinine 0.61 - 1.24 mg/dL 6.80  5.20  5.18   Sodium 135 - 145 mmol/L 136  134  134   Potassium 3.5 - 5.1 mmol/L 4.9  4.6  4.1   Chloride 98 - 111 mmol/L 108  105  99   CO2 22 - 32 mmol/L   23   Calcium 8.9 - 10.3 mg/dL   8.9   Total Protein 6.5 - 8.1 g/dL   7.2   Total Bilirubin 0.3 - 1.2 mg/dL   1.2   Alkaline Phos 38 - 126 U/L   61   AST 15 - 41 U/L   23   ALT 0 - 44 U/L   15     Renal function CrCl cannot be calculated (Patient's most recent lab result is older than the maximum 21 days allowed.).  Hemoglobin A1C (%)  Date Value  06/13/2021 11.0 (A)   Hgb A1c MFr Bld (%)  Date Value  12/31/2020 8.0 (H)    LDL Cholesterol  Date Value Ref Range Status  06/26/2010 99 0 - 99 mg/dL Final    Comment:    See lab report for associated comment(s)     Vascular Imaging: AVF duplex shows good flow through fistula without stenosis.  Yevonne Aline. Stanford Breed, MD [facility] Phone Number: [phone] 09/16/2021 9:59 AM  Total time spent on preparing this encounter including chart review, data review, collecting history, examining the patient, coordinating care for this established patient, 30 minutes.   Portions of this report may have been transcribed using voice recognition software.  Every effort has  been made to ensure accuracy; however, inadvertent computerized transcription errors may still be present.

## 2021-09-16 ENCOUNTER — Encounter: Payer: Self-pay | Admitting: Vascular Surgery

## 2021-09-16 ENCOUNTER — Ambulatory Visit (INDEPENDENT_AMBULATORY_CARE_PROVIDER_SITE_OTHER): Payer: Medicare Other | Admitting: Vascular Surgery

## 2021-09-16 VITALS — BP 153/67 | HR 63 | Temp 98.6°F | Resp 20 | Ht 68.0 in | Wt 192.0 lb

## 2021-09-16 DIAGNOSIS — N186 End stage renal disease: Secondary | ICD-10-CM

## 2021-09-16 DIAGNOSIS — Z992 Dependence on renal dialysis: Secondary | ICD-10-CM

## 2021-09-25 ENCOUNTER — Encounter (HOSPITAL_COMMUNITY)
Admission: RE | Admit: 2021-09-25 | Discharge: 2021-09-25 | Disposition: A | Discharge status: Home / Self Care | Payer: Medicare Other | Source: Ambulatory Visit | Attending: Nephrology | Admitting: Nephrology

## 2021-09-25 VITALS — BP 144/63 | HR 73 | Temp 97.1°F | Resp 18

## 2021-09-25 DIAGNOSIS — N184 Chronic kidney disease, stage 4 (severe): Secondary | ICD-10-CM

## 2021-09-25 LAB — POCT HEMOGLOBIN-HEMACUE: Hemoglobin: 12.3 g/dL — ABNORMAL LOW (ref 13.0–17.0)

## 2021-09-25 MED ORDER — EPOETIN ALFA-EPBX 10000 UNIT/ML IJ SOLN
INTRAMUSCULAR | Status: AC
  Filled 2021-09-25: qty 1

## 2021-09-25 MED ORDER — EPOETIN ALFA-EPBX 10000 UNIT/ML IJ SOLN: 10000.0000 [IU] | INTRAMUSCULAR | Status: DC

## 2021-10-09 ENCOUNTER — Encounter (HOSPITAL_COMMUNITY)
Admission: RE | Admit: 2021-10-09 | Discharge: 2021-10-09 | Disposition: A | Discharge status: Home / Self Care | Payer: Medicare Other | Source: Ambulatory Visit | Attending: Nephrology | Admitting: Nephrology

## 2021-10-09 VITALS — BP 105/52 | HR 62 | Temp 97.3°F | Resp 18

## 2021-10-09 DIAGNOSIS — N184 Chronic kidney disease, stage 4 (severe): Secondary | ICD-10-CM | POA: Diagnosis present

## 2021-10-09 DIAGNOSIS — D631 Anemia in chronic kidney disease: Secondary | ICD-10-CM | POA: Diagnosis present

## 2021-10-09 LAB — IRON AND TIBC
Iron: 93 ug/dL (ref 45–182)
Saturation Ratios: 37 % (ref 17.9–39.5)
TIBC: 251 ug/dL (ref 250–450)
UIBC: 158 ug/dL

## 2021-10-09 LAB — POCT HEMOGLOBIN-HEMACUE: Hemoglobin: 9.9 g/dL — ABNORMAL LOW (ref 13.0–17.0)

## 2021-10-09 LAB — FERRITIN: Ferritin: 184 ng/mL (ref 24–336)

## 2021-10-09 MED ORDER — EPOETIN ALFA 10000 UNIT/ML IJ SOLN
10000.0000 [IU] | Freq: Once | INTRAMUSCULAR | Status: AC
  Administered 2021-10-09: 10000 [IU] via SUBCUTANEOUS

## 2021-10-09 MED ORDER — EPOETIN ALFA-EPBX 10000 UNIT/ML IJ SOLN
10000.0000 [IU] | INTRAMUSCULAR | Status: DC
  Filled 2021-10-09: qty 1

## 2021-10-09 MED ORDER — EPOETIN ALFA 10000 UNIT/ML IJ SOLN
INTRAMUSCULAR | Status: AC
  Filled 2021-10-09: qty 1

## 2021-10-13 ENCOUNTER — Ambulatory Visit (INDEPENDENT_AMBULATORY_CARE_PROVIDER_SITE_OTHER): Payer: Medicare Other | Admitting: Internal Medicine

## 2021-10-13 ENCOUNTER — Encounter: Payer: Self-pay | Admitting: Internal Medicine

## 2021-10-13 VITALS — BP 136/78 | HR 70 | Wt 193.8 lb

## 2021-10-13 DIAGNOSIS — N184 Chronic kidney disease, stage 4 (severe): Secondary | ICD-10-CM | POA: Diagnosis not present

## 2021-10-13 DIAGNOSIS — E1122 Type 2 diabetes mellitus with diabetic chronic kidney disease: Secondary | ICD-10-CM | POA: Diagnosis not present

## 2021-10-13 DIAGNOSIS — Z794 Long term (current) use of insulin: Secondary | ICD-10-CM | POA: Diagnosis not present

## 2021-10-13 LAB — POCT GLUCOSE (DEVICE FOR HOME USE): Glucose Fasting, POC: 166 mg/dL — AB (ref 70–99)

## 2021-10-13 LAB — POCT GLYCOSYLATED HEMOGLOBIN (HGB A1C): Hemoglobin A1C: 8.3 % — AB (ref 4.0–5.6)

## 2021-10-13 NOTE — Patient Instructions (Signed)
Novolog correctional insulin: Use the scale below to help guide you at Breakfast, Lunch and Dinner time   Blood sugar before meal Number of units to inject  Less than 175 0 unit  176 -  220 1 units  221 -  265 2 units  266 -  310 3 units  311 -  355 4 units  356 -  400 5 units  401 -  445 6 units   HOW TO TREAT LOW BLOOD SUGARS (Blood sugar LESS THAN 70 MG/DL) Please follow the RULE OF 15 for the treatment of hypoglycemia treatment (when your (blood sugars are less than 70 mg/dL)   STEP 1: Take 15 grams of carbohydrates when your blood sugar is low, which includes:  3-4 GLUCOSE TABS  OR 3-4 OZ OF JUICE OR REGULAR SODA OR ONE TUBE OF GLUCOSE GEL    STEP 2: RECHECK blood sugar in 15 MINUTES STEP 3: If your blood sugar is still low at the 15 minute recheck --> then, go back to STEP 1 and treat AGAIN with another 15 grams of carbohydrates.

## 2021-10-13 NOTE — Progress Notes (Signed)
Name: Tyler Patterson  MRN/ DOB: [id number], 1950/06/14   Age/ Sex: 71 y.o., male    PCP: Jilda Panda, MD   Reason for Endocrinology Evaluation: Type 2 Diabetes Mellitus     Date of Initial Endocrinology Visit: 06/13/2021    PATIENT IDENTIFIER: [name] is a 71 y.o. male with a past medical history of T2DM and HTN, CKD IV , Hx of alcohol abuse . The patient presented for initial endocrinology clinic visit on 06/13/2021 for consultative assistance with his diabetes management.    HPI: Tyler Patterson  is accompanied by his two sisters    Diagnosed with DM in 2011 Prior Medications tried/Intolerance: Novolog/Lantus stopped during the pandemic due to loss of transportation .             Hemoglobin A1c has ranged from 8.0% in 2022, peaking at 13.5% in 2018. Patient required assistance for hypoglycemia: yes - 2021   On his initial visit to our clinic his A1c was 11.0%, he was started on prandial insulin per correction scale due to fear of hypoglycemia    SUBJECTIVE:   During the last visit (06/13/2021): A1c 11.0% we started novolog per correction scale   Today (10/13/21): Tyler Patterson is here for follow-up on diabetes management. He is accompanied by his sister today . He checks his blood sugars multiple times daily, through CGM. The patient did not bring his CGM today .He  had hypoglycemic episodes since the last clinic visit  Denies nausea, vomiting or diarrhea   Status post left upper extremity fistula 05/2021, his last follow-up of vascular surgery was on 09/16/2021  HOME DIABETES REGIMEN: Humalog per correction scale (BG -130/45)      Statin: yes ACE-I/ARB: yes     CONTINUOUS GLUCOSE MONITORING RECORD INTERPRETATION  Did not bring     DIABETIC COMPLICATIONS: Microvascular complications:  CKD IV, neuropathy Denies: retinopathy Last eye exam: Completed > 2 yrs ago Macrovascular complications:   Denies: CAD, PVD, CVA   PAST HISTORY: Past Medical History:  Past  Medical History:  Diagnosis Date   Alcohol abuse    Anemia    Chronic kidney disease    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   COPD (chronic obstructive pulmonary disease) ([place])    Depression    Diabetes mellitus    type 2   Headache 05/13/2013   Headache(784.0)    Hypertension    Past Surgical History:  Past Surgical History:  Procedure Laterality Date   AV FISTULA PLACEMENT Left 05/28/2021   Procedure: LEFT BRACHIOBASILIC ARTERIOVENOUS FISTULA CREATION;  Surgeon: [name], MD;  Location: [place];  Service: Vascular;  Laterality: Left;  PERIPHERAL NERVE BLOCK   [place] Left 07/28/2021   Procedure: SECOND STAGE BASILIC VEIN FISTULA;  Surgeon: [name], MD;  Location: [place];  Service: Vascular;  Laterality: Left;  PERIPHERAL NERVE BLOCK   COLON SURGERY     Hemicolectomy for benign colon mass   HERNIA REPAIR     INCISIONAL HERNIA REPAIR N/A 01/14/2021   Procedure: REPAIR OF INCISIONAL HERNIA WITH MESH;  Surgeon: Erroll Luna, MD;  Location: [place];  Service: General;  Laterality: N/A;    Social History:  reports that he quit smoking about 7 months ago. His smoking use included cigars and cigarettes. He smoked an average of .25 packs  per day. He has never used smokeless tobacco. He reports that he does not currently use alcohol. He reports that he does not use drugs. Family History:  Family History  Problem Relation Age of Onset   Lung cancer Mother    Lung cancer Father    Diabetes Mellitus II Neg Hx      HOME MEDICATIONS: Allergies as of 10/13/2021       Reactions   Duloxetine    Other reaction(s): Abdominal pain   Fiorinal [butalbital-aspirin-caffeine] Swelling   Spironolactone    Other reaction(s): Gynecomastia        Medication List        Accurate as of October 13, 2021  8:40 AM. If you have any  questions, ask your nurse or doctor.          aspirin EC 81 MG tablet Take 81 mg by mouth daily. Swallow whole.   atorvastatin 40 MG tablet Commonly known as: LIPITOR Take 20 mg by mouth daily.   BD Pen Needle Nano U/F 32G X 4 MM Misc Generic drug: Insulin Pen Needle 1 Device by Does not apply route 3 (three) times daily.   carvedilol 25 MG tablet Commonly known as: COREG Take 25 mg by mouth 2 (two) times daily with a meal.   FreeStyle Libre 2 Sensor Misc 1 Device by Does not apply route every 14 (fourteen) days.   furosemide 40 MG tablet Commonly known as: LASIX Take 40 mg by mouth 2 (two) times daily.   hydrALAZINE 50 MG tablet Commonly known as: APRESOLINE Take 50 mg by mouth 3 (three) times daily.   insulin lispro 100 UNIT/ML KwikPen Commonly known as: HumaLOG KwikPen Max daily 20 units What changed:  how much to take when to take this additional instructions   losartan 100 MG tablet Commonly known as: COZAAR Take 100 mg by mouth daily.   metolazone 5 MG tablet Commonly known as: ZAROXOLYN Take 5 mg by mouth See admin instructions. Monday,Wednesday, Friday as need for swelling   OneTouch Verio test strip Generic drug: glucose blood 1 each by Other route 3 (three) times daily. Use as instructed   oxyCODONE-acetaminophen 5-325 MG tablet Commonly known as: Percocet Take 1 tablet by mouth every 6 (six) hours as needed for severe pain.   pregabalin 150 MG capsule Commonly known as: LYRICA Take 150 mg by mouth 2 (two) times daily.   vitamin D3 50 MCG (2000 UT) Caps Take 2,000 Units by mouth daily.         ALLERGIES: Allergies  Allergen Reactions   Duloxetine     Other reaction(s): Abdominal pain   Fiorinal [Butalbital-Aspirin-Caffeine] Swelling   Spironolactone     Other reaction(s): Gynecomastia     REVIEW OF SYSTEMS: A comprehensive ROS was conducted with the patient and is negative except as per HPI     OBJECTIVE:   VITAL SIGNS: BP  136/78 (BP Location: Right Arm, Patient Position: Sitting, Cuff Size: Small)   Pulse 70   Wt 193 lb 12.8 oz (87.9 kg)   SpO2 98%   BMI 29.47 kg/m    PHYSICAL EXAM:  General: Pt appears well and is in NAD  Lungs: Clear with good BS bilat   Heart: RRR   Extremities: 1+ pretibial edema.   Neuro: MS is good with appropriate affect, pt is alert and Ox3     DATA REVIEWED:  Lab Results  Component Value Date   HGBA1C 8.3 (A) 10/13/2021   HGBA1C 11.0 (A)  06/13/2021   HGBA1C 8.0 (H) 12/31/2020    Latest Reference Range & Units 05/28/21 08:37  Sodium 135 - 145 mmol/L 134 (L)  Potassium 3.5 - 5.1 mmol/L 4.6  Chloride 98 - 111 mmol/L 105  Glucose 70 - 99 mg/dL 263 (H)  BUN 8 - 23 mg/dL 81 (H)  Creatinine 0.61 - 1.24 mg/dL 5.20 (H)  Calcium Ionized 1.15 - 1.40 mmol/L 1.23       Lab Results  Component Value Date   MICROALBUR 1.79 06/26/2010   LDLCALC 99 06/26/2010   CREATININE 6.80 (H) 07/28/2021   No results found for: "MICRALBCREAT"  Lab Results  Component Value Date   CHOL 171 06/26/2010   HDL 61 06/26/2010   LDLCALC 99 06/26/2010   TRIG 57 06/26/2010   CHOLHDL 2.8 Ratio 06/26/2010        ASSESSMENT / PLAN / RECOMMENDATIONS:   1) Type 2 Diabetes Mellitus, poorly controlled, With CKD IV complications - Most recent A1c of 8.3 %. Goal A1c <7.0%.     - A1c trended down from 11.0% to 8.3 %  - limited oral glycemic agents due to CKD IV - Discussed importance of having glucose data available to me during these visit, no changes at this this  -I would not put him on basal insulin at this time unless more  accurate data is available - Fasting in-office BG 166 mg/dL   MEDICATIONS: Continue Humalog (BG -130/45)  TIDQAC  EDUCATION / INSTRUCTIONS: BG monitoring instructions: Patient is instructed to check his blood sugars 3 times a day, before meals. Call [facility] if: BG persistently < 70  I reviewed the Rule of 15 for the treatment of hypoglycemia in  detail with the patient. Literature supplied.   2) Diabetic complications:  Eye: Does not have known diabetic retinopathy.  Neuro/ Feet: Does not have known diabetic peripheral neuropathy. Renal: Patient does  have known baseline CKD. He is  on an ACEI/ARB at present.    Follow-up in 6 months   Signed electronically by: Mack Guise, MD  [facility]  [address] [place], [place] [address] Phone: [phone] FAX: [phone]   CC: Jilda Panda, MD [address] [place] [address] Phone: [phone]  Fax: [phone]    Return to Endocrinology clinic as below: Future Appointments  Date Time Provider [place]  10/23/2021  8:30 AM [facility] None  04/15/2022  7:50 AM Crescencio Jozwiak, Melanie Crazier, MD [facility] None

## 2021-10-23 ENCOUNTER — Encounter (HOSPITAL_COMMUNITY)
Admission: RE | Admit: 2021-10-23 | Discharge: 2021-10-23 | Disposition: A | Discharge status: Home / Self Care | Payer: Medicare Other | Source: Ambulatory Visit | Attending: Nephrology | Admitting: Nephrology

## 2021-10-23 VITALS — BP 105/73 | HR 70 | Temp 97.5°F | Resp 18

## 2021-10-23 DIAGNOSIS — D631 Anemia in chronic kidney disease: Secondary | ICD-10-CM

## 2021-10-23 DIAGNOSIS — G253 Myoclonus: Secondary | ICD-10-CM | POA: Diagnosis not present

## 2021-10-23 DIAGNOSIS — I120 Hypertensive chronic kidney disease with stage 5 chronic kidney disease or end stage renal disease: Secondary | ICD-10-CM | POA: Diagnosis not present

## 2021-10-23 LAB — POCT HEMOGLOBIN-HEMACUE: Hemoglobin: 8.7 g/dL — ABNORMAL LOW (ref 13.0–17.0)

## 2021-10-23 MED ORDER — EPOETIN ALFA 10000 UNIT/ML IJ SOLN: 10000.0000 [IU] | Freq: Once | INTRAMUSCULAR | Status: AC

## 2021-10-23 MED ORDER — EPOETIN ALFA 10000 UNIT/ML IJ SOLN
INTRAMUSCULAR | Status: AC
  Administered 2021-10-23: 10000 [IU] via SUBCUTANEOUS
  Filled 2021-10-23: qty 1

## 2021-10-23 MED ORDER — EPOETIN ALFA-EPBX 10000 UNIT/ML IJ SOLN: 10000.0000 [IU] | INTRAMUSCULAR | Status: DC

## 2021-10-24 ENCOUNTER — Other Ambulatory Visit: Payer: Self-pay

## 2021-10-24 ENCOUNTER — Inpatient Hospital Stay (HOSPITAL_COMMUNITY)
Admission: EM | Admit: 2021-10-24 | Discharge: 2021-10-28 | DRG: 682 | Disposition: A | Discharge status: Home / Self Care | Payer: Medicare Other | Attending: Internal Medicine | Admitting: Internal Medicine

## 2021-10-24 ENCOUNTER — Emergency Department (HOSPITAL_COMMUNITY): Payer: Medicare Other

## 2021-10-24 ENCOUNTER — Encounter (HOSPITAL_COMMUNITY): Payer: Self-pay | Admitting: Emergency Medicine

## 2021-10-24 DIAGNOSIS — E1165 Type 2 diabetes mellitus with hyperglycemia: Secondary | ICD-10-CM | POA: Diagnosis present

## 2021-10-24 DIAGNOSIS — Z794 Long term (current) use of insulin: Secondary | ICD-10-CM

## 2021-10-24 DIAGNOSIS — I120 Hypertensive chronic kidney disease with stage 5 chronic kidney disease or end stage renal disease: Principal | ICD-10-CM | POA: Diagnosis present

## 2021-10-24 DIAGNOSIS — R296 Repeated falls: Secondary | ICD-10-CM | POA: Diagnosis present

## 2021-10-24 DIAGNOSIS — K219 Gastro-esophageal reflux disease without esophagitis: Secondary | ICD-10-CM | POA: Diagnosis present

## 2021-10-24 DIAGNOSIS — N185 Chronic kidney disease, stage 5: Secondary | ICD-10-CM

## 2021-10-24 DIAGNOSIS — Z79899 Other long term (current) drug therapy: Secondary | ICD-10-CM | POA: Diagnosis not present

## 2021-10-24 DIAGNOSIS — G253 Myoclonus: Secondary | ICD-10-CM | POA: Diagnosis present

## 2021-10-24 DIAGNOSIS — D649 Anemia, unspecified: Secondary | ICD-10-CM | POA: Diagnosis present

## 2021-10-24 DIAGNOSIS — E1142 Type 2 diabetes mellitus with diabetic polyneuropathy: Secondary | ICD-10-CM | POA: Diagnosis present

## 2021-10-24 DIAGNOSIS — E872 Acidosis, unspecified: Secondary | ICD-10-CM | POA: Diagnosis present

## 2021-10-24 DIAGNOSIS — N186 End stage renal disease: Secondary | ICD-10-CM

## 2021-10-24 DIAGNOSIS — N184 Chronic kidney disease, stage 4 (severe): Secondary | ICD-10-CM

## 2021-10-24 DIAGNOSIS — E785 Hyperlipidemia, unspecified: Secondary | ICD-10-CM | POA: Diagnosis present

## 2021-10-24 DIAGNOSIS — F32A Depression, unspecified: Secondary | ICD-10-CM | POA: Diagnosis present

## 2021-10-24 DIAGNOSIS — Z87891 Personal history of nicotine dependence: Secondary | ICD-10-CM

## 2021-10-24 DIAGNOSIS — E1122 Type 2 diabetes mellitus with diabetic chronic kidney disease: Secondary | ICD-10-CM | POA: Diagnosis present

## 2021-10-24 DIAGNOSIS — Z7982 Long term (current) use of aspirin: Secondary | ICD-10-CM | POA: Diagnosis not present

## 2021-10-24 DIAGNOSIS — I10 Essential (primary) hypertension: Secondary | ICD-10-CM | POA: Diagnosis present

## 2021-10-24 DIAGNOSIS — R269 Unspecified abnormalities of gait and mobility: Secondary | ICD-10-CM | POA: Diagnosis present

## 2021-10-24 DIAGNOSIS — E119 Type 2 diabetes mellitus without complications: Secondary | ICD-10-CM

## 2021-10-24 DIAGNOSIS — E876 Hypokalemia: Secondary | ICD-10-CM | POA: Diagnosis not present

## 2021-10-24 DIAGNOSIS — N179 Acute kidney failure, unspecified: Secondary | ICD-10-CM | POA: Diagnosis present

## 2021-10-24 DIAGNOSIS — J449 Chronic obstructive pulmonary disease, unspecified: Secondary | ICD-10-CM | POA: Diagnosis present

## 2021-10-24 DIAGNOSIS — N19 Unspecified kidney failure: Secondary | ICD-10-CM | POA: Diagnosis not present

## 2021-10-24 DIAGNOSIS — E8770 Fluid overload, unspecified: Secondary | ICD-10-CM | POA: Diagnosis present

## 2021-10-24 DIAGNOSIS — Z888 Allergy status to other drugs, medicaments and biological substances status: Secondary | ICD-10-CM

## 2021-10-24 DIAGNOSIS — Z9049 Acquired absence of other specified parts of digestive tract: Secondary | ICD-10-CM

## 2021-10-24 DIAGNOSIS — W19XXXA Unspecified fall, initial encounter: Secondary | ICD-10-CM | POA: Diagnosis present

## 2021-10-24 LAB — COMPREHENSIVE METABOLIC PANEL
ALT: 23 U/L (ref 0–44)
AST: 23 U/L (ref 15–41)
Albumin: 3.6 g/dL (ref 3.5–5.0)
Alkaline Phosphatase: 55 U/L (ref 38–126)
Anion gap: 17 — ABNORMAL HIGH (ref 5–15)
BUN: 155 mg/dL — ABNORMAL HIGH (ref 8–23)
CO2: 15 mmol/L — ABNORMAL LOW (ref 22–32)
Calcium: 8 mg/dL — ABNORMAL LOW (ref 8.9–10.3)
Chloride: 106 mmol/L (ref 98–111)
Creatinine, Ser: 6.36 mg/dL — ABNORMAL HIGH (ref 0.61–1.24)
GFR, Estimated: 9 mL/min — ABNORMAL LOW (ref >60)
Glucose, Bld: 139 mg/dL — ABNORMAL HIGH (ref 70–99)
Potassium: 4.4 mmol/L (ref 3.5–5.1)
Sodium: 138 mmol/L (ref 135–145)
Total Bilirubin: 1.1 mg/dL (ref 0.3–1.2)
Total Protein: 6.5 g/dL (ref 6.5–8.1)

## 2021-10-24 LAB — CBC WITH DIFFERENTIAL/PLATELET
Abs Immature Granulocytes: 0.02 10*3/uL (ref 0.00–0.07)
Basophils Absolute: 0 10*3/uL (ref 0.0–0.1)
Basophils Relative: 1 %
Eosinophils Absolute: 0.1 10*3/uL (ref 0.0–0.5)
Eosinophils Relative: 3 %
HCT: 26.3 % — ABNORMAL LOW (ref 39.0–52.0)
Hemoglobin: 8.7 g/dL — ABNORMAL LOW (ref 13.0–17.0)
Immature Granulocytes: 1 %
Lymphocytes Relative: 20 %
Lymphs Abs: 0.8 10*3/uL (ref 0.7–4.0)
MCH: 30.1 pg (ref 26.0–34.0)
MCHC: 33.1 g/dL (ref 30.0–36.0)
MCV: 91 fL (ref 80.0–100.0)
Monocytes Absolute: 0.6 10*3/uL (ref 0.1–1.0)
Monocytes Relative: 15 %
Neutro Abs: 2.5 10*3/uL (ref 1.7–7.7)
Neutrophils Relative %: 60 %
Platelets: 128 10*3/uL — ABNORMAL LOW (ref 150–400)
RBC: 2.89 MIL/uL — ABNORMAL LOW (ref 4.22–5.81)
RDW: 15 % (ref 11.5–15.5)
WBC: 4.2 10*3/uL (ref 4.0–10.5)
nRBC: 0 % (ref 0.0–0.2)

## 2021-10-24 LAB — MAGNESIUM: Magnesium: 1.2 mg/dL — ABNORMAL LOW (ref 1.7–2.4)

## 2021-10-24 LAB — PHOSPHORUS: Phosphorus: 8.7 mg/dL — ABNORMAL HIGH (ref 2.5–4.6)

## 2021-10-24 MED ORDER — HEPARIN SODIUM (PORCINE) 5000 UNIT/ML IJ SOLN
5000.0000 [IU] | Freq: Three times a day (TID) | INTRAMUSCULAR | Status: DC
  Administered 2021-10-25 – 2021-10-28 (×9): 5000 [IU] via SUBCUTANEOUS
  Filled 2021-10-24 (×10): qty 1

## 2021-10-24 MED ORDER — MAGNESIUM OXIDE -MG SUPPLEMENT 400 (240 MG) MG PO TABS
800.0000 mg | ORAL_TABLET | Freq: Once | ORAL | Status: DC
Start: 2021-10-24 — End: 2021-10-28

## 2021-10-24 MED ORDER — CHLORHEXIDINE GLUCONATE CLOTH 2 % EX PADS
6.0000 | MEDICATED_PAD | Freq: Every day | CUTANEOUS | Status: DC
  Administered 2021-10-25 – 2021-10-26 (×2): 6 via TOPICAL

## 2021-10-24 MED ORDER — FUROSEMIDE 10 MG/ML IJ SOLN
80.0000 mg | Freq: Two times a day (BID) | INTRAMUSCULAR | Status: DC
  Administered 2021-10-24 – 2021-10-28 (×6): 80 mg via INTRAVENOUS
  Filled 2021-10-24 (×6): qty 8

## 2021-10-24 MED ORDER — SODIUM BICARBONATE 650 MG PO TABS
1300.0000 mg | ORAL_TABLET | Freq: Four times a day (QID) | ORAL | Status: AC
  Administered 2021-10-25: 1300 mg via ORAL
  Filled 2021-10-24 (×2): qty 2

## 2021-10-24 NOTE — H&P (Incomplete)
History and Physical  Tyler Patterson VEH:[id number] DOB: [birth year] DOA: 10/24/2021  Referring physician: Dr. Maryan Rued, [place]  PCP: Jilda Panda, MD  Outpatient Specialists: Nephrology, Dr. Posey Pronto. Patient coming from: Home  Chief Complaint: Worsening tremors.   HPI: Tyler Patterson is a 71 y.o. male with medical history significant for CKD 5 status post left second stage brachiobasilic AV fistula placement on 07/28/2021, type 2 diabetes, diabetic polyneuropathy, hyperlipidemia, hypertension, who presented to [facility] ED from home due to progressively worsening tremors for the past week.  Associated with muscle twitching, dropping things.  Per the patient his nephrologist recommended that he come to the ED for further evaluation and possible hemodialysis.    Work-up in the ED concerning for possible uremia versus Lyrica toxicity.  EDP discussed this case with nephrology on-call Dr. Moshe Cipro.  Plan for hemodialysis on 10/25/2021.  The patient was admitted by [facility], hospitalist service.  ED Course: Tmax 99.2.  BP 139/71, pulse 69, respiratory 16, O2 saturation 100% on room air.  Lab studies remarkable for serum bicarb 15, glucose 139, BUN 155, creatinine 6.36.  Anion gap 17.  Phosphorus 8.7, magnesium 1.2.  Hemoglobin 8.7, platelet 128.  Review of Systems: Review of systems as noted in the HPI. All other systems reviewed and are negative.   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   COPD (chronic obstructive pulmonary disease) ([place])    Depression    Diabetes mellitus    type 2   Headache 05/13/2013   Headache(784.0)    Hypertension    Past Surgical History:  Procedure Laterality Date   AV FISTULA PLACEMENT Left 05/28/2021   Procedure: LEFT BRACHIOBASILIC ARTERIOVENOUS FISTULA CREATION;   Surgeon: [name], MD;  Location: [place];  Service: Vascular;  Laterality: Left;  PERIPHERAL NERVE BLOCK   [place] Left 07/28/2021   Procedure: SECOND STAGE BASILIC VEIN FISTULA;  Surgeon: [name], MD;  Location: [place] [facility];  Service: Vascular;  Laterality: Left;  PERIPHERAL NERVE BLOCK   COLON SURGERY     Hemicolectomy for benign colon mass   HERNIA REPAIR     INCISIONAL HERNIA REPAIR N/A 01/14/2021   Procedure: REPAIR OF INCISIONAL HERNIA WITH MESH;  Surgeon: Erroll Luna, MD;  Location: [place];  Service: General;  Laterality: N/A;    Social History:  reports that he quit smoking about 8 months ago. His smoking use included cigars and cigarettes. He smoked an average of .25 packs per day. He has never used smokeless tobacco. He reports that he does not currently use alcohol. He reports that he does not use drugs.   Allergies  Allergen Reactions   Duloxetine     Other reaction(s): Abdominal pain   Fiorinal [Butalbital-Aspirin-Caffeine] Swelling   Spironolactone     Other reaction(s): Gynecomastia    Family History  Problem Relation Age of Onset   Lung cancer Mother    Lung cancer Father    Diabetes Mellitus II Neg Hx       Prior to Admission medications   Medication Sig Start Date End Date Taking? Authorizing Provider  aspirin EC 81 MG tablet Take 81 mg by mouth daily. Swallow whole.    [provider]  atorvastatin (  LIPITOR) 40 MG tablet Take 20 mg by mouth daily.    [provider]  carvedilol (COREG) 25 MG tablet Take 25 mg by mouth 2 (two) times daily with a meal.    [provider]  Cholecalciferol (VITAMIN D3) 50 MCG (2000 UT) CAPS Take 2,000 Units by mouth daily.    [provider]  Continuous Blood Gluc Sensor (FREESTYLE LIBRE 2 SENSOR) MISC 1 Device by Does not apply route every 14 (fourteen) days. 06/13/21   Shamleffer, Melanie Crazier, MD  furosemide (LASIX) 40 MG tablet Take 40 mg  by mouth 2 (two) times daily. 03/20/21   [provider]  glucose blood (ONETOUCH VERIO) test strip 1 each by Other route 3 (three) times daily. Use as instructed 06/13/21   Shamleffer, Melanie Crazier, MD  hydrALAZINE (APRESOLINE) 50 MG tablet Take 50 mg by mouth 3 (three) times daily.    [provider]  insulin lispro (HUMALOG KWIKPEN) 100 UNIT/ML KwikPen Max daily 20 units Patient taking differently: 1-5 Units 3 (three) times daily. Sliding scale Max daily 20 units 06/13/21   Shamleffer, Melanie Crazier, MD  Insulin Pen Needle (BD PEN NEEDLE NANO U/F) 32G X 4 MM MISC 1 Device by Does not apply route 3 (three) times daily. 06/13/21   Shamleffer, Melanie Crazier, MD  losartan (COZAAR) 100 MG tablet Take 100 mg by mouth daily.    [provider]  metolazone (ZAROXOLYN) 5 MG tablet Take 5 mg by mouth See admin instructions. Monday,Wednesday, Friday as need for swelling    [provider]  oxyCODONE-acetaminophen (PERCOCET) 5-325 MG tablet Take 1 tablet by mouth every 6 (six) hours as needed for severe pain. 07/28/21 07/28/22  Dagoberto Ligas, PA-C  pregabalin (LYRICA) 150 MG capsule Take 150 mg by mouth 2 (two) times daily.    [provider]    Physical Exam: BP 139/71   Pulse 69   Temp 99.2 F (37.3 C) (Oral)   Resp 16   SpO2 100%   General: 71 y.o. year-old male well developed well nourished in no acute distress.  Alert and oriented x3. Cardiovascular: Regular rate and rhythm with no rubs or gallops.  No thyromegaly or JVD noted.  No lower extremity edema. 2/4 pulses in all 4 extremities. Respiratory: Clear to auscultation with no wheezes or rales. Good inspiratory effort. Abdomen: Soft nontender nondistended with normal bowel sounds x4 quadrants. Muskuloskeletal: No cyanosis, clubbing or edema noted bilaterally Neuro: CN II-XII intact, strength, sensation, reflexes Skin: No ulcerative lesions noted or rashes Psychiatry: Judgement and insight  appear normal. Mood is appropriate for condition and setting          Labs on Admission:  Basic Metabolic Panel: Recent Labs  Lab 10/24/21 2014  NA 138  K 4.4  CL 106  CO2 15*  GLUCOSE 139*  BUN 155*  CREATININE 6.36*  CALCIUM 8.0*  MG 1.2*  PHOS 8.7*   Liver Function Tests: Recent Labs  Lab 10/24/21 2014  AST 23  ALT 23  ALKPHOS 55  BILITOT 1.1  PROT 6.5  ALBUMIN 3.6   No results for input(s): "LIPASE", "AMYLASE" in the last 168 hours. No results for input(s): "AMMONIA" in the last 168 hours. CBC: Recent Labs  Lab 10/23/21 0838 10/24/21 2014  WBC  --  4.2  NEUTROABS  --  2.5  HGB 8.7* 8.7*  HCT  --  26.3*  MCV  --  91.0  PLT  --  128*   Cardiac Enzymes: No results  for input(s): "CKTOTAL", "CKMB", "CKMBINDEX", "TROPONINI" in the last 168 hours.  BNP (last 3 results) No results for input(s): "BNP" in the last 8760 hours.  ProBNP (last 3 results) No results for input(s): "PROBNP" in the last 8760 hours.  CBG: No results for input(s): "GLUCAP" in the last 168 hours.  Radiological Exams on Admission: CT Head Wo Contrast  Result Date: 10/24/2021 CLINICAL DATA:  Seizure EXAM: CT HEAD WITHOUT CONTRAST TECHNIQUE: Contiguous axial images were obtained from the base of the skull through the vertex without intravenous contrast. RADIATION DOSE REDUCTION: This exam was performed according to the departmental dose-optimization program which includes automated exposure control, adjustment of the mA and/or kV according to patient size and/or use of iterative reconstruction technique. COMPARISON:  MRI 04/18/2021, CT brain 02/27/2017 FINDINGS: Brain: No hemorrhage or intracranial mass. There is motion degradation. Small interval age indeterminate left occipital infarct, series 3, image 16, sagittal series 6, image 39. Extensive chronic small vessel ischemic changes of the white matter. Chronic right cerebellar infarct. Chronic lacunar infarcts within the thalamus and  bilateral basal ganglia. Ventricles are nonenlarged. Vascular: No hyperdense vessels. Vertebral and carotid vascular calcification. Skull: Normal. Negative for fracture or focal lesion. Sinuses/Orbits: Mild mucosal thickening in the sinuses Other: None IMPRESSION: 1. Mild motion degradation. 2. Negative for hemorrhage or intracranial mass 3. Interval small age indeterminate left occipital infarct, suspect this may be chronic. Atrophy and extensive chronic small vessel ischemic changes of the white matter. Multiple chronic lacunar infarcts within the thalamus, basal ganglia and right cerebellum. Electronically Signed   By: Donavan Foil M.D.   On: 10/24/2021 22:44   DG Chest Port 1 View  Result Date: 10/24/2021 CLINICAL DATA:  Tremors for 1 week EXAM: PORTABLE CHEST 1 VIEW COMPARISON:  12/22/2016 FINDINGS: Cardiac shadow is within normal limits. The lungs are well aerated bilaterally. No focal infiltrate or sizable effusion is seen. No bony abnormality is noted. IMPRESSION: No active disease. Electronically Signed   By: Inez Catalina M.D.   On: 10/24/2021 21:00    EKG: I independently viewed the EKG done and my findings are as followed: Sinus rhythm rate of 82.  Nonspecific ST-T changes.  QTc 463.  Assessment/Plan Present on Admission:  Uremia  Principal Problem:   Uremia  Presented with uremia with progressive tremors, muscle twitching. Nephrology consulted by EDP, plan for hemodialysis on 10/25/2021. Hold off home Lyrica. Management per nephrology.  New ESRD, plan for hemodialysis Plan per nephrology. The patient will need dialysis clipping prior to discharge.  High anion gap metabolic acidosis Serum bicarb 15, anion gap of 17 Started sodium bicarb 1300 mg 4 times daily x4 doses. Resume chemistry panel in the morning.  Hypomagnesemia Serum magnesium 1.2 Repleted with magnesium oxide 800 mg x 1.  Hyperphosphatemia Defer to nephrology to address electrolytes  Hypertension BP stable  139/71  Closely monitor vital signs.  Hyperlipidemia Resume home Lipitor.  Type 2 diabetes with hyperglycemia Obtain hemoglobin A1c Start insulin sliding scale Avoid hypoglycemia  Diabetic polyneuropathy Hold off home Lyrica.   Critical care time: 65 minutes.   DVT prophylaxis: Subcu heparin 3 times daily  Code Status: Full code  Family Communication: Family member at bedside.  Disposition Plan: Admitted to telemetry medical unit  Consults called: Nephrology consulted by EDP  Admission status: Inpatient status.   Status is: Inpatient The patient requires at least 2 midnights for further evaluation and treatment of present condition.   Kayleen Memos MD [facility] Pager [phone]  If  7PM-7AM, please contact night-coverage [email or web address] Password [facility]  10/24/2021, 11:44 PM

## 2021-10-24 NOTE — Consult Note (Signed)
[facility] Renal Consultation Note  Requesting MD:  Indication for Consultation: stage 5 CKD and uremic sxms  HPI:  ERMON Patterson is a 71 y.o. male with past medical history significant for HTN , DM, spinal stenosis, COPD and depression.  He also has stage 5 CKD followed by Dr. Posey Pronto at [facility].  Pt has been approaching the need for dialysis but has been scared. He did however undergo an AVF placement in March and second stage of BVT in May which has been cleared for use.  He last saw Dr. Posey Pronto in May did not feel needed to start at that time.  I got a call from pts sister tonight- saying that patient has been feeling poorly -  has fallen a few times and has a tremor that has intensified over the last several days.  She remembered that Dr. Posey Pronto had described uremic sxms and she felt that these were it.  They had an appt to see Dr. Posey Pronto next week but she did not feel he could make it that long.  I suggested she bring him in where we could get dialysis started more expeditiously.  When I see him -  he is having involumtary movements of his body but otherwise seems clear-  he is not having any GI sxms.  He is on lyrica 150 BID as OP.  I  do not have labs yet here  Creatinine, Ser  Date/Time Value Ref Range Status  07/28/2021 07:14 AM 6.80 (H) 0.61 - 1.24 mg/dL Final  05/28/2021 08:37 AM 5.20 (H) 0.61 - 1.24 mg/dL Final  04/18/2021 09:57 AM 5.18 (H) 0.61 - 1.24 mg/dL Final  04/06/2021 12:46 PM 4.38 (H) 0.61 - 1.24 mg/dL Final  01/09/2021 07:28 AM 4.47 (H) 0.61 - 1.24 mg/dL Final  02/27/2017 11:39 AM 1.37 (H) 0.61 - 1.24 mg/dL Final  12/24/2016 03:10 AM 1.48 (H) 0.61 - 1.24 mg/dL Final  12/23/2016 03:54 AM 1.51 (H) 0.61 - 1.24 mg/dL Final  12/22/2016 12:12 PM 1.50 (H) 0.61 - 1.24 mg/dL Final  12/22/2016 11:38 AM 1.58 (H) 0.61 - 1.24 mg/dL Final  08/27/2016 07:53 AM 1.29 (H) 0.61 - 1.24 mg/dL Final  08/27/2016 01:40 AM 1.25 (H) 0.61 - 1.24 mg/dL Final  10/24/2014 03:22 AM 1.16 0.61 -  1.24 mg/dL Final  10/05/2014 05:12 AM 1.01 0.61 - 1.24 mg/dL Final  10/03/2014 09:48 PM 1.09 0.61 - 1.24 mg/dL Final  05/14/2013 04:08 AM 0.91 0.50 - 1.35 mg/dL Final  05/13/2013 02:30 PM 0.80 0.50 - 1.35 mg/dL Final  05/13/2013 09:36 AM 1.00 0.50 - 1.35 mg/dL Final  05/13/2013 09:02 AM 0.89 0.50 - 1.35 mg/dL Final  08/21/2011 06:10 AM 1.01 0.50 - 1.35 mg/dL Final  08/20/2011 06:08 AM 0.90 0.50 - 1.35 mg/dL Final  08/19/2011 01:57 PM 0.97 0.50 - 1.35 mg/dL Final  08/19/2011 08:52 AM 0.98 0.50 - 1.35 mg/dL Final  06/04/2011 07:40 AM 0.99 0.50 - 1.35 mg/dL Final  06/26/2010 08:57 PM 1.07 0.40 - 1.50 mg/dL Final  03/27/2010 04:10 AM 1.03 0.4 - 1.5 mg/dL Final  03/26/2010 09:16 PM 1.07 0.4 - 1.5 mg/dL Final  03/26/2010 12:22 PM 1.08 0.4 - 1.5 mg/dL Final  03/26/2010 08:39 AM 1.14 0.4 - 1.5 mg/dL Final  03/26/2010 03:30 AM 1.19 0.4 - 1.5 mg/dL Final  03/26/2010 12:01 AM 1.44 0.4 - 1.5 mg/dL Final  03/25/2010 08:32 PM 1.70 (H) 0.4 - 1.5 mg/dL Final  03/25/2010 03:08 PM 2.14 (H) 0.4 - 1.5 mg/dL Final  03/25/2010 01:23  PM 2.1 (H) 0.4 - 1.5 mg/dL Final  03/25/2010 11:55 AM 2.72 (H) 0.4 - 1.5 mg/dL Final     PMHx:   Past Medical History:  Diagnosis Date   Alcohol abuse    Anemia    Chronic kidney disease    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   COPD (chronic obstructive pulmonary disease) ([place])    Depression    Diabetes mellitus    type 2   Headache 05/13/2013   Headache(784.0)    Hypertension     Past Surgical History:  Procedure Laterality Date   AV FISTULA PLACEMENT Left 05/28/2021   Procedure: LEFT BRACHIOBASILIC ARTERIOVENOUS FISTULA CREATION;  Surgeon: [name], MD;  Location: [place];  Service: Vascular;  Laterality: Left;  PERIPHERAL NERVE BLOCK   [place] Left 07/28/2021   Procedure: SECOND STAGE BASILIC VEIN FISTULA;  Surgeon:  [name], MD;  Location: [place];  Service: Vascular;  Laterality: Left;  PERIPHERAL NERVE BLOCK   COLON SURGERY     Hemicolectomy for benign colon mass   HERNIA REPAIR     INCISIONAL HERNIA REPAIR N/A 01/14/2021   Procedure: REPAIR OF INCISIONAL HERNIA WITH MESH;  Surgeon: Erroll Luna, MD;  Location: [place];  Service: General;  Laterality: N/A;    Family Hx:  Family History  Problem Relation Age of Onset   Lung cancer Mother    Lung cancer Father    Diabetes Mellitus II Neg Hx     Social History:  reports that he quit smoking about 8 months ago. His smoking use included cigars and cigarettes. He smoked an average of .25 packs per day. He has never used smokeless tobacco. He reports that he does not currently use alcohol. He reports that he does not use drugs.  Allergies:  Allergies  Allergen Reactions   Duloxetine     Other reaction(s): Abdominal pain   Fiorinal [Butalbital-Aspirin-Caffeine] Swelling   Spironolactone     Other reaction(s): Gynecomastia    Medications: Prior to Admission medications   Medication Sig Start Date End Date Taking? Authorizing Provider  aspirin EC 81 MG tablet Take 81 mg by mouth daily. Swallow whole.    [provider]  atorvastatin (LIPITOR) 40 MG tablet Take 20 mg by mouth daily.    [provider]  carvedilol (COREG) 25 MG tablet Take 25 mg by mouth 2 (two) times daily with a meal.    [provider]  Cholecalciferol (VITAMIN D3) 50 MCG (2000 UT) CAPS Take 2,000 Units by mouth daily.    [provider]  Continuous Blood Gluc Sensor (FREESTYLE LIBRE 2 SENSOR) MISC 1 Device by Does not apply route every 14 (fourteen) days. 06/13/21   Shamleffer, Melanie Crazier, MD  furosemide (LASIX) 40 MG tablet Take 40 mg by mouth 2 (two) times daily. 03/20/21   [provider]  glucose blood (ONETOUCH VERIO) test strip 1 each by Other route 3 (three) times daily. Use as instructed 06/13/21    Shamleffer, Melanie Crazier, MD  hydrALAZINE (APRESOLINE) 50 MG tablet Take 50 mg by mouth 3 (three) times daily.    [provider]  insulin lispro (HUMALOG KWIKPEN) 100 UNIT/ML KwikPen Max daily 20 units Patient taking differently: 1-5 Units 3 (three) times daily. Sliding scale Max daily 20 units 06/13/21   Shamleffer, Melanie Crazier, MD  Insulin Pen Needle (BD PEN NEEDLE NANO U/F) 32G X 4 MM MISC 1 Device by Does not apply route 3 (three) times daily. 06/13/21   Shamleffer, Melanie Crazier, MD  losartan (COZAAR) 100 MG tablet Take 100 mg by mouth daily.    [provider]  metolazone (ZAROXOLYN) 5 MG tablet Take 5 mg by mouth See admin instructions. Monday,Wednesday, Friday as need for swelling    [provider]  oxyCODONE-acetaminophen (PERCOCET) 5-325 MG tablet Take 1 tablet by mouth every 6 (six) hours as needed for severe pain. 07/28/21 07/28/22  Dagoberto Ligas, PA-C  pregabalin (LYRICA) 150 MG capsule Take 150 mg by mouth 2 (two) times daily.    [provider]    I have reviewed the patient's current medications.  Labs:  Results for orders placed or performed during the hospital encounter of 10/23/21 (from the past 48 hour(s))  Hemoglobin-hemacue, POC     Status: Abnormal   Collection Time: 10/23/21  8:38 AM  Result Value Ref Range   Hemoglobin 8.7 (L) 13.0 - 17.0 g/dL     ROS:  A comprehensive review of systems was negative except for: Cardiovascular: positive for dyspnea and lower extremity edema Neurological: positive for tremors  Physical Exam: Vitals:   10/24/21 1943 10/24/21 1957  BP: 138/67   Pulse: 84   Resp: 12   Temp: 99.2 F (37.3 C)   SpO2: 100% 98%     General: well developed BM-  involuntary movements of his entire body otherwise pretty clear in thought HEENT: PERRLA, EOMI, mucous membranes moist  Neck: positive for JVD Heart: RRR Lungs: dec BS at the bases Abdomen: soft, non tender Extremities: pitting edema bilat   left upper AVF with good thrill and bruit-   seems mature Skin: warm and dry Neuro: alert, involuntary body movements-  tremors  Assessment/Plan: 71 year old BM with advanced CKD known to [facility]-  has AVF in place presenting with worsening tremors over the last few days  Tremors-  the differential would include uremia or the build up of lyrica in his system from decreased renal clearance.  The fact that he does not have N/V or a more altered MS makes me think the latter.  Hold lyrica and check labs 2.Renal- advanced CKD at baseline with AVF in place and mature.  Checking labs.   If there is anything dangerous or urgent can start dialysis tonight.  If not I will likely wait.  He is agreeable either way-  he just wants to get better 3. Hypertension/volume  - BP seems reasonable but he is overloaded-  would hold any BP meds and I will attempt to diurese with IV lasix-  fortunately is on RA 4. Anemia  - is on ESA as OP-  will check levels and act as needed   Louis Meckel 10/24/2021, 8:18 PM

## 2021-10-24 NOTE — ED Provider Notes (Signed)
[facility] EMERGENCY DEPARTMENT Provider Note   CSN: [id number] Arrival date & time: 10/24/21  1933     History  Chief Complaint  Patient presents with   Tremors    Tyler Patterson is a 71 y.o. male.  Patient is a 71 year old male with a history of diabetes, hypertension, prior alcohol abuse, COPD, chronic kidney disease who has a maturing fistula, anemia who receives Procrit every 2 weeks who is presenting today with worsening tremors.  Patient reports they have been present for several months but they have been significantly worse in the last week and today he has had 2 falls because he attempts to walk and he starts having a spasm in his legs lock up and he falls down.  He denies hitting his head or any trauma to his head.  He takes no anticoagulation.  They spoke with their nephrologist today and they recommend they come to the emergency room as they were concerned that this may be a result of his kidneys.  He denies any shortness of breath, cough, fever, abdominal pain, nausea vomiting or diarrhea.  He does take Lyrica but denies any recent medication changes.  He is compliant with his medications.  The history is provided by the patient, medical records and a relative.       Home Medications Prior to Admission medications   Medication Sig Start Date End Date Taking? Authorizing Provider  aspirin EC 81 MG tablet Take 81 mg by mouth daily. Swallow whole.    [provider]  atorvastatin (LIPITOR) 40 MG tablet Take 20 mg by mouth daily.    [provider]  carvedilol (COREG) 25 MG tablet Take 25 mg by mouth 2 (two) times daily with a meal.    [provider]  Cholecalciferol (VITAMIN D3) 50 MCG (2000 UT) CAPS Take 2,000 Units by mouth daily.    [provider]  Continuous Blood Gluc Sensor (FREESTYLE LIBRE 2 SENSOR) MISC 1 Device by Does not apply route every 14 (fourteen) days. 06/13/21   Shamleffer, Melanie Crazier, MD   furosemide (LASIX) 40 MG tablet Take 40 mg by mouth 2 (two) times daily. 03/20/21   [provider]  glucose blood (ONETOUCH VERIO) test strip 1 each by Other route 3 (three) times daily. Use as instructed 06/13/21   Shamleffer, Melanie Crazier, MD  hydrALAZINE (APRESOLINE) 50 MG tablet Take 50 mg by mouth 3 (three) times daily.    [provider]  insulin lispro (HUMALOG KWIKPEN) 100 UNIT/ML KwikPen Max daily 20 units Patient taking differently: 1-5 Units 3 (three) times daily. Sliding scale Max daily 20 units 06/13/21   Shamleffer, Melanie Crazier, MD  Insulin Pen Needle (BD PEN NEEDLE NANO U/F) 32G X 4 MM MISC 1 Device by Does not apply route 3 (three) times daily. 06/13/21   Shamleffer, Melanie Crazier, MD  losartan (COZAAR) 100 MG tablet Take 100 mg by mouth daily.    [provider]  metolazone (ZAROXOLYN) 5 MG tablet Take 5 mg by mouth See admin instructions. Monday,Wednesday, Friday as need for swelling    [provider]  oxyCODONE-acetaminophen (PERCOCET) 5-325 MG tablet Take 1 tablet by mouth every 6 (six) hours as needed for severe pain. 07/28/21 07/28/22  Dagoberto Ligas, PA-C  pregabalin (LYRICA) 150 MG capsule Take 150 mg by mouth 2 (two) times daily.    [provider]      Allergies    Duloxetine, Fiorinal [butalbital-aspirin-caffeine], and Spironolactone    Review  of Systems   Review of Systems  Physical Exam Updated Vital Signs BP 139/71   Pulse 69   Temp 99.2 F (37.3 C) (Oral)   Resp 16   SpO2 100%  Physical Exam Vitals and nursing note reviewed.  Constitutional:      General: He is not in acute distress.    Appearance: He is well-developed.  HENT:     Head: Normocephalic and atraumatic.  Eyes:     Conjunctiva/sclera: Conjunctivae normal.     Pupils: Pupils are equal, round, and reactive to light.  Cardiovascular:     Rate and Rhythm: Normal rate and regular rhythm.     Heart sounds: No murmur heard. Pulmonary:      Effort: Pulmonary effort is normal. No respiratory distress.     Breath sounds: Normal breath sounds. No wheezing or rales.  Abdominal:     General: There is no distension.     Palpations: Abdomen is soft.     Tenderness: There is no abdominal tenderness. There is no guarding or rebound.  Musculoskeletal:        General: No tenderness. Normal range of motion.     Cervical back: Normal range of motion and neck supple.     Right lower leg: Edema present.     Left lower leg: Edema present.  Skin:    General: Skin is warm and dry.     Findings: No erythema or rash.  Neurological:     Mental Status: He is alert and oriented to person, place, and time.     Comments: Frequent twitching of the mouth,  upper and lower extremities.  Normal mentation and 5/5 strength in all 4 extremities.  Psychiatric:        Mood and Affect: Mood normal.        Behavior: Behavior normal.     ED Results / Procedures / Treatments   Labs (all labs ordered are listed, but only abnormal results are displayed) Labs Reviewed  COMPREHENSIVE METABOLIC PANEL - Abnormal; Notable for the following components:      Result Value   CO2 15 (*)    Glucose, Bld 139 (*)    BUN 155 (*)    Creatinine, Ser 6.36 (*)    Calcium 8.0 (*)    GFR, Estimated 9 (*)    Anion gap 17 (*)    All other components within normal limits  CBC WITH DIFFERENTIAL/PLATELET - Abnormal; Notable for the following components:   RBC 2.89 (*)    Hemoglobin 8.7 (*)    HCT 26.3 (*)    Platelets 128 (*)    All other components within normal limits  PHOSPHORUS - Abnormal; Notable for the following components:   Phosphorus 8.7 (*)    All other components within normal limits  MAGNESIUM - Abnormal; Notable for the following components:   Magnesium 1.2 (*)    All other components within normal limits  HEPATITIS B SURFACE ANTIGEN  HEPATITIS B SURFACE ANTIBODY,QUALITATIVE  HEPATITIS B SURFACE ANTIBODY, QUANTITATIVE  HEPATITIS B CORE ANTIBODY,  TOTAL  HEPATITIS C ANTIBODY    EKG EKG Interpretation  Date/Time:  Friday October 24 2021 19:45:01 EDT Ventricular Rate:  82 PR Interval:    QRS Duration: 80 QT Interval:  396 QTC Calculation: 463 R Axis:   89 Text Interpretation: Sinus rhythm Artifact Borderline right axis deviation Confirmed by Blanchie Dessert [phone]) on 10/24/2021 8:10:11 PM  Radiology CT Head Wo Contrast  Result Date: 10/24/2021 CLINICAL DATA:  Seizure  EXAM: CT HEAD WITHOUT CONTRAST TECHNIQUE: Contiguous axial images were obtained from the base of the skull through the vertex without intravenous contrast. RADIATION DOSE REDUCTION: This exam was performed according to the departmental dose-optimization program which includes automated exposure control, adjustment of the mA and/or kV according to patient size and/or use of iterative reconstruction technique. COMPARISON:  MRI 04/18/2021, CT brain 02/27/2017 FINDINGS: Brain: No hemorrhage or intracranial mass. There is motion degradation. Small interval age indeterminate left occipital infarct, series 3, image 16, sagittal series 6, image 39. Extensive chronic small vessel ischemic changes of the white matter. Chronic right cerebellar infarct. Chronic lacunar infarcts within the thalamus and bilateral basal ganglia. Ventricles are nonenlarged. Vascular: No hyperdense vessels. Vertebral and carotid vascular calcification. Skull: Normal. Negative for fracture or focal lesion. Sinuses/Orbits: Mild mucosal thickening in the sinuses Other: None IMPRESSION: 1. Mild motion degradation. 2. Negative for hemorrhage or intracranial mass 3. Interval small age indeterminate left occipital infarct, suspect this may be chronic. Atrophy and extensive chronic small vessel ischemic changes of the white matter. Multiple chronic lacunar infarcts within the thalamus, basal ganglia and right cerebellum. Electronically Signed   By: Donavan Foil M.D.   On: 10/24/2021 22:44   DG Chest Port 1  View  Result Date: 10/24/2021 CLINICAL DATA:  Tremors for 1 week EXAM: PORTABLE CHEST 1 VIEW COMPARISON:  12/22/2016 FINDINGS: Cardiac shadow is within normal limits. The lungs are well aerated bilaterally. No focal infiltrate or sizable effusion is seen. No bony abnormality is noted. IMPRESSION: No active disease. Electronically Signed   By: Inez Catalina M.D.   On: 10/24/2021 21:00    Procedures Procedures    Medications Ordered in ED Medications  furosemide (LASIX) injection 80 mg (80 mg Intravenous Given 10/24/21 2207)  heparin injection 5,000 Units (has no administration in time range)  Chlorhexidine Gluconate Cloth 2 % PADS 6 each (has no administration in time range)  sodium bicarbonate tablet 1,300 mg (has no administration in time range)  magnesium oxide (MAG-OX) tablet 800 mg (has no administration in time range)    ED Course/ Medical Decision Making/ A&P                           Medical Decision Making Amount and/or Complexity of Data Reviewed Labs: ordered. Decision-making details documented in ED Course. Radiology: ordered and independent interpretation performed. Decision-making details documented in ED Course. ECG/medicine tests: ordered and independent interpretation performed. Decision-making details documented in ED Course.  Risk Decision regarding hospitalization.   Pt with multiple medical problems and comorbidities and presenting today with a complaint that caries a high risk for morbidity and mortality.  Here today with worsening tremors, now recurrent falls.  He also reports that even difficult to eat because his hands were twitching and shaking so much.  He is of normal mental status and episodes do not look like focal seizures.  He has no symptoms suggestive of infectious etiology.  Concern for uremia versus a side effect of the medication.  Given he has had some falls will ensure no evidence of intracranial hemorrhage.  I independently interpreted patient's labs  and CMP today shows persistent elevated creatinine at 6.36 and uremia with a BUN of 155 and anion gap of 17.  CBC with stable hemoglobin of 8.7, hyperphosphatemia with a phosphorus of 8.7.  Magnesium slightly low at 1.2. I have independently visualized and interpreted pt's images today.  Head CT is negative for intracranial hemorrhage.  Chest x-ray without acute findings.  I independently interpreted patient's EKG today that shows no acute findings.  I consulted Dr. Moshe Cipro with nephrology and they wish to dialyze him tomorrow.  I consulted hospitalist for admission.  Findings were discussed with patient and his family member.  He is comfortable with this plan.          Final Clinical Impression(s) / ED Diagnoses Final diagnoses:  Uremia  Myoclonus    Rx / DC Orders ED Discharge Orders     None         Blanchie Dessert, MD 10/24/21 2345

## 2021-10-24 NOTE — ED Triage Notes (Signed)
Pt in via [facility] with c/o tremors x 1 wk, states worsened today. Per pt, hhas hx of CKD stg 5 and his nephrologist told him to come here and get emergent dialysis - has maturing L arm fistula present.

## 2021-10-25 DIAGNOSIS — N185 Chronic kidney disease, stage 5: Secondary | ICD-10-CM

## 2021-10-25 DIAGNOSIS — N186 End stage renal disease: Secondary | ICD-10-CM | POA: Diagnosis not present

## 2021-10-25 LAB — COMPREHENSIVE METABOLIC PANEL
ALT: 24 U/L (ref 0–44)
AST: 20 U/L (ref 15–41)
Albumin: 3.4 g/dL — ABNORMAL LOW (ref 3.5–5.0)
Alkaline Phosphatase: 55 U/L (ref 38–126)
Anion gap: 16 — ABNORMAL HIGH (ref 5–15)
BUN: 153 mg/dL — ABNORMAL HIGH (ref 8–23)
CO2: 18 mmol/L — ABNORMAL LOW (ref 22–32)
Calcium: 8.1 mg/dL — ABNORMAL LOW (ref 8.9–10.3)
Chloride: 106 mmol/L (ref 98–111)
Creatinine, Ser: 6.22 mg/dL — ABNORMAL HIGH (ref 0.61–1.24)
GFR, Estimated: 9 mL/min — ABNORMAL LOW (ref >60)
Glucose, Bld: 104 mg/dL — ABNORMAL HIGH (ref 70–99)
Potassium: 3.8 mmol/L (ref 3.5–5.1)
Sodium: 140 mmol/L (ref 135–145)
Total Bilirubin: 0.8 mg/dL (ref 0.3–1.2)
Total Protein: 6.5 g/dL (ref 6.5–8.1)

## 2021-10-25 LAB — GLUCOSE, CAPILLARY: Glucose-Capillary: 106 mg/dL — ABNORMAL HIGH (ref 70–99)

## 2021-10-25 LAB — CBC WITH DIFFERENTIAL/PLATELET
Abs Immature Granulocytes: 0.01 10*3/uL (ref 0.00–0.07)
Basophils Absolute: 0 10*3/uL (ref 0.0–0.1)
Basophils Relative: 1 %
Eosinophils Absolute: 0.1 10*3/uL (ref 0.0–0.5)
Eosinophils Relative: 3 %
HCT: 25.8 % — ABNORMAL LOW (ref 39.0–52.0)
Hemoglobin: 8.8 g/dL — ABNORMAL LOW (ref 13.0–17.0)
Immature Granulocytes: 0 %
Lymphocytes Relative: 26 %
Lymphs Abs: 1 10*3/uL (ref 0.7–4.0)
MCH: 30.2 pg (ref 26.0–34.0)
MCHC: 34.1 g/dL (ref 30.0–36.0)
MCV: 88.7 fL (ref 80.0–100.0)
Monocytes Absolute: 0.7 10*3/uL (ref 0.1–1.0)
Monocytes Relative: 19 %
Neutro Abs: 1.9 10*3/uL (ref 1.7–7.7)
Neutrophils Relative %: 51 %
Platelets: 146 10*3/uL — ABNORMAL LOW (ref 150–400)
RBC: 2.91 MIL/uL — ABNORMAL LOW (ref 4.22–5.81)
RDW: 14.8 % (ref 11.5–15.5)
WBC: 3.8 10*3/uL — ABNORMAL LOW (ref 4.0–10.5)
nRBC: 0 % (ref 0.0–0.2)

## 2021-10-25 LAB — PHOSPHORUS: Phosphorus: 8.5 mg/dL — ABNORMAL HIGH (ref 2.5–4.6)

## 2021-10-25 LAB — MAGNESIUM: Magnesium: 1.4 mg/dL — ABNORMAL LOW (ref 1.7–2.4)

## 2021-10-25 LAB — HEPATITIS C ANTIBODY: HCV Ab: NONREACTIVE

## 2021-10-25 LAB — HEPATITIS B SURFACE ANTIGEN: Hepatitis B Surface Ag: NONREACTIVE

## 2021-10-25 LAB — HEPATITIS B CORE ANTIBODY, TOTAL: Hep B Core Total Ab: NONREACTIVE

## 2021-10-25 LAB — HEPATITIS B SURFACE ANTIBODY,QUALITATIVE: Hep B S Ab: REACTIVE — AB

## 2021-10-25 MED ORDER — CALCIUM ACETATE (PHOS BINDER) 667 MG PO CAPS
1334.0000 mg | ORAL_CAPSULE | Freq: Three times a day (TID) | ORAL | Status: DC
  Administered 2021-10-25 – 2021-10-28 (×7): 1334 mg via ORAL
  Filled 2021-10-25 (×7): qty 2

## 2021-10-25 MED ORDER — DARBEPOETIN ALFA 200 MCG/0.4ML IJ SOSY
200.0000 ug | PREFILLED_SYRINGE | INTRAMUSCULAR | Status: DC
  Administered 2021-10-25: 200 ug via INTRAVENOUS
  Filled 2021-10-25 (×2): qty 0.4

## 2021-10-25 NOTE — Progress Notes (Addendum)
PROGRESS NOTE    Tyler Patterson  ENI:[id number] DOB: Nov 29, 1950 DOA: 10/24/2021 PCP: Jilda Panda, MD   Brief Narrative:  Tyler Patterson is a 71 y.o. male with medical history significant for CKD 5 status post left second stage brachiobasilic AV fistula placement on 07/28/2021, insulin-dependent type 2 diabetes, diabetic polyneuropathy on Lyrica, hyperlipidemia, hypertension, who presented to [facility] ED from home via EMS due to progressively worsening tremors for the past week.    Assessment & Plan:   Principal Problem:   ESRD (end stage renal disease) ([place]) Active Problems:   HTN (hypertension)   Fall   GERD (gastroesophageal reflux disease)   Diabetes mellitus type 2 in nonobese (HCC)   Uremia   CKD (chronic kidney disease) stage 5, GFR less than 15 ml/min (HCC)   AKI on CKD 5 transitioning to ESRD, new onset  Concurrent symptomatic uremia, acute -Nephrology following, status post fistula placement earlier this year given worsening kidney function over the past few years -Plan to initiate dialysis via fistula today per nephrology -Discussed with patient short-term versus long-term dialysis needs and given his worsening baseline creatinine this will likely be a permanent transition -We will order dietary consult given patient's sister and patient were unclear about what a renal diet meant  Ambulatory dysfunction, acute on chronic Tremors, secondary to above versus polypharmacy -Previously undergoing PT OT outpatient, markedly weak comparatively in the setting of above -PT OT to follow for ultimate recommendations prior to discharge; may benefit from placement versus more aggressive therapy at home or outpatient  High anion gap metabolic acidosis Secondary to above Likely to resolve with HD, bicarb per nephrology  Hypomagnesemia/hypophosphatemia Follow along with nephrology  Hypertension, stable. Initiated on IV Lasix, high-dose at intake, defer to nephrology on further indications  of diuretics in a dialysis patient   Hyperlipidemia Resume home Lipitor.   Type 2 diabetes uncontrolled with hyperglycemia -A1c 8.3 -Continue sliding scale insulin, hypoglycemic protocol -Dietary to follow as above, continue diabetic diet as appropriate   Diabetic polyneuropathy -Holding home Lyrica  Generalized weakness PT OT to assess Fall precautions.   DVT prophylaxis: Heparin subcutaneous Code Status: Full Family Communication: Sister at bedside  Status is: Inpatient  Dispo: The patient is from: Home              Anticipated d/c is to: To be determined              Anticipated d/c date is: 48 to 72 hours              Patient currently not medically stable for discharge  Consultants:  Nephrology  Procedures:  Hemodialysis initiation  Antimicrobials:  None  Subjective: No acute issues or events overnight denies nausea vomiting diarrhea constipation any fevers chills or chest pain  Objective: Vitals:   10/25/21 0100 10/25/21 0222 10/25/21 0542 10/25/21 0750  BP: 132/63 (!) 145/64 (!) 151/78 (!) 148/62  Pulse: 65  70 67  Resp:  17 11 10   Temp:  98.1 F (36.7 C) 97.6 F (36.4 C) (!) 97.5 F (36.4 C)  TempSrc:  Oral Oral Oral  SpO2: 98% 99% 100% 100%  Weight:  80.3 kg    Height:  5\' 8"  (1.727 m)      Intake/Output Summary (Last 24 hours) at 10/25/2021 1156 Last data filed at 10/25/2021 1017 Gross per 24 hour  Intake 600 ml  Output 1575 ml  Net -975 ml   Filed Weights   10/25/21 0222  Weight: 80.3 kg  Examination:  General:  Pleasantly resting in bed, No acute distress. HEENT:  Normocephalic atraumatic.  Sclerae nonicteric, noninjected.  Extraocular movements intact bilaterally. Neck:  Without mass or deformity.  Trachea is midline. Lungs:  Clear to auscultate bilaterally without rhonchi, wheeze, or rales. Heart:  Regular rate and rhythm.  Without murmurs, rubs, or gallops. Abdomen:  Soft, nontender, nondistended.  Without guarding or  rebound. Extremities: Without cyanosis, clubbing, edema, or obvious deformity. Vascular: Left AC fistula palpable thrill. Skin:  Warm and dry, no erythema, no ulcerations.   Data Reviewed: I have personally reviewed following labs and imaging studies  CBC: Recent Labs  Lab 10/23/21 0838 10/24/21 2014 10/25/21 0558  WBC  --  4.2 3.8*  NEUTROABS  --  2.5 1.9  HGB 8.7* 8.7* 8.8*  HCT  --  26.3* 25.8*  MCV  --  91.0 88.7  PLT  --  128* 170*   Basic Metabolic Panel: Recent Labs  Lab 10/24/21 2014 10/25/21 0558  NA 138 140  K 4.4 3.8  CL 106 106  CO2 15* 18*  GLUCOSE 139* 104*  BUN 155* 153*  CREATININE 6.36* 6.22*  CALCIUM 8.0* 8.1*  MG 1.2* 1.4*  PHOS 8.7* 8.5*   GFR: Estimated Creatinine Clearance: 10.5 mL/min (A) (by C-G formula based on SCr of 6.22 mg/dL (H)). Liver Function Tests: Recent Labs  Lab 10/24/21 2014 10/25/21 0558  AST 23 20  ALT 23 24  ALKPHOS 55 55  BILITOT 1.1 0.8  PROT 6.5 6.5  ALBUMIN 3.6 3.4*   No results for input(s): "LIPASE", "AMYLASE" in the last 168 hours. No results for input(s): "AMMONIA" in the last 168 hours. Coagulation Profile: No results for input(s): "INR", "PROTIME" in the last 168 hours. Cardiac Enzymes: No results for input(s): "CKTOTAL", "CKMB", "CKMBINDEX", "TROPONINI" in the last 168 hours. BNP (last 3 results) No results for input(s): "PROBNP" in the last 8760 hours. HbA1C: No results for input(s): "HGBA1C" in the last 72 hours. CBG: Recent Labs  Lab 10/25/21 0625  GLUCAP 106*   Lipid Profile: No results for input(s): "CHOL", "HDL", "LDLCALC", "TRIG", "CHOLHDL", "LDLDIRECT" in the last 72 hours. Thyroid Function Tests: No results for input(s): "TSH", "T4TOTAL", "FREET4", "T3FREE", "THYROIDAB" in the last 72 hours. Anemia Panel: No results for input(s): "VITAMINB12", "FOLATE", "FERRITIN", "TIBC", "IRON", "RETICCTPCT" in the last 72 hours. Sepsis Labs: No results for input(s): "PROCALCITON", "LATICACIDVEN" in  the last 168 hours.  No results found for this or any previous visit (from the past 240 hour(s)).       Radiology Studies: CT Head Wo Contrast  Result Date: 10/24/2021 CLINICAL DATA:  Seizure EXAM: CT HEAD WITHOUT CONTRAST TECHNIQUE: Contiguous axial images were obtained from the base of the skull through the vertex without intravenous contrast. RADIATION DOSE REDUCTION: This exam was performed according to the departmental dose-optimization program which includes automated exposure control, adjustment of the mA and/or kV according to patient size and/or use of iterative reconstruction technique. COMPARISON:  MRI 04/18/2021, CT brain 02/27/2017 FINDINGS: Brain: No hemorrhage or intracranial mass. There is motion degradation. Small interval age indeterminate left occipital infarct, series 3, image 16, sagittal series 6, image 39. Extensive chronic small vessel ischemic changes of the white matter. Chronic right cerebellar infarct. Chronic lacunar infarcts within the thalamus and bilateral basal ganglia. Ventricles are nonenlarged. Vascular: No hyperdense vessels. Vertebral and carotid vascular calcification. Skull: Normal. Negative for fracture or focal lesion. Sinuses/Orbits: Mild mucosal thickening in the sinuses Other: None IMPRESSION: 1. Mild motion degradation.  2. Negative for hemorrhage or intracranial mass 3. Interval small age indeterminate left occipital infarct, suspect this may be chronic. Atrophy and extensive chronic small vessel ischemic changes of the white matter. Multiple chronic lacunar infarcts within the thalamus, basal ganglia and right cerebellum. Electronically Signed   By: Donavan Foil M.D.   On: 10/24/2021 22:44   DG Chest Port 1 View  Result Date: 10/24/2021 CLINICAL DATA:  Tremors for 1 week EXAM: PORTABLE CHEST 1 VIEW COMPARISON:  12/22/2016 FINDINGS: Cardiac shadow is within normal limits. The lungs are well aerated bilaterally. No focal infiltrate or sizable effusion is  seen. No bony abnormality is noted. IMPRESSION: No active disease. Electronically Signed   By: Inez Catalina M.D.   On: 10/24/2021 21:00    Scheduled Meds:  calcium acetate  1,334 mg Oral TID WC   Chlorhexidine Gluconate Cloth  6 each Topical Q0600   darbepoetin (ARANESP) injection - DIALYSIS  200 mcg Intravenous Q Sat-HD   furosemide  80 mg Intravenous Q12H   heparin  5,000 Units Subcutaneous Q8H   magnesium oxide  800 mg Oral Once   sodium bicarbonate  1,300 mg Oral QID   Continuous Infusions:   LOS: 1 day   Time spent: 80min  Montray Kliebert C Danean Marner, DO [facility]  If 7PM-7AM, please contact night-coverage [email or web address]  10/25/2021, 11:56 AM

## 2021-10-25 NOTE — Progress Notes (Signed)
Subjective:  tremors have improved with holding the lyrica however, I explained to him that his BUN is 155 which is very abnormal and it probably would serve him best to get dialysis started this hospitalization -  he is agreeable-  for first HD today   Objective Vital signs in last 24 hours: Vitals:   10/25/21 0100 10/25/21 0222 10/25/21 0542 10/25/21 0750  BP: 132/63 (!) 145/64 (!) 151/78 (!) 148/62  Pulse: 65  70 67  Resp:  17 11 10   Temp:  98.1 F (36.7 C) 97.6 F (36.4 C) (!) 97.5 F (36.4 C)  TempSrc:  Oral Oral Oral  SpO2: 98% 99% 100% 100%  Weight:  80.3 kg    Height:  5\' 8"  (1.727 m)     Weight change:   Intake/Output Summary (Last 24 hours) at 10/25/2021 8588 Last data filed at 10/25/2021 0843 Gross per 24 hour  Intake 600 ml  Output 1200 ml  Net -600 ml    Assessment/Plan: 71 year old BM with advanced CKD known to CKA-  has AVF in place presenting with worsening tremors over the last few days  Tremors-  the differential would include uremia or the build up of lyrica in his system from decreased renal clearance.  The fact that he does not have N/V or a more altered MS makes me think the latter.  His tremors are better off of lyrica but BUN is 155    2.Renal- advanced CKD at baseline with AVF in place and mature.  Labs showing BUN of 155-  is also volume overloaded.  I discussed that I think the best course of action is to initiate dialysis -  he is agreeable-  for first HD today , second on Monday-  then will need to get CLIP and education on Monday as well  3. Hypertension/volume  - BP seems reasonable but he is overloaded-  would hold any BP meds and I will attempt to diurese with IV lasix, not very successful-   UF with HD and stop diuretics at discharge 4. Anemia  - is on ESA as OP- last iron sat good on 7/13-  will continue ESA  5. Bones-  phos 8.5-  start binder and also check PTH  6. Metabolic acidosis-  on po bicarb-  will continue over weekend but then stop and  control acidosis with HD     Louis Meckel    Labs: Basic Metabolic Panel: Recent Labs  Lab 10/24/21 2014 10/25/21 0558  NA 138 140  K 4.4 3.8  CL 106 106  CO2 15* 18*  GLUCOSE 139* 104*  BUN 155* 153*  CREATININE 6.36* 6.22*  CALCIUM 8.0* 8.1*  PHOS 8.7* 8.5*   Liver Function Tests: Recent Labs  Lab 10/24/21 2014 10/25/21 0558  AST 23 20  ALT 23 24  ALKPHOS 55 55  BILITOT 1.1 0.8  PROT 6.5 6.5  ALBUMIN 3.6 3.4*   No results for input(s): "LIPASE", "AMYLASE" in the last 168 hours. No results for input(s): "AMMONIA" in the last 168 hours. CBC: Recent Labs  Lab 10/23/21 0838 10/24/21 2014 10/25/21 0558  WBC  --  4.2 3.8*  NEUTROABS  --  2.5 1.9  HGB 8.7* 8.7* 8.8*  HCT  --  26.3* 25.8*  MCV  --  91.0 88.7  PLT  --  128* 146*   Cardiac Enzymes: No results for input(s): "CKTOTAL", "CKMB", "CKMBINDEX", "TROPONINI" in the last 168 hours. CBG: Recent Labs  Lab 10/25/21  9924  GLUCAP 106*    Iron Studies: No results for input(s): "IRON", "TIBC", "TRANSFERRIN", "FERRITIN" in the last 72 hours. Studies/Results: CT Head Wo Contrast  Result Date: 10/24/2021 CLINICAL DATA:  Seizure EXAM: CT HEAD WITHOUT CONTRAST TECHNIQUE: Contiguous axial images were obtained from the base of the skull through the vertex without intravenous contrast. RADIATION DOSE REDUCTION: This exam was performed according to the departmental dose-optimization program which includes automated exposure control, adjustment of the mA and/or kV according to patient size and/or use of iterative reconstruction technique. COMPARISON:  MRI 04/18/2021, CT brain 02/27/2017 FINDINGS: Brain: No hemorrhage or intracranial mass. There is motion degradation. Small interval age indeterminate left occipital infarct, series 3, image 16, sagittal series 6, image 39. Extensive chronic small vessel ischemic changes of the white matter. Chronic right cerebellar infarct. Chronic lacunar infarcts within the  thalamus and bilateral basal ganglia. Ventricles are nonenlarged. Vascular: No hyperdense vessels. Vertebral and carotid vascular calcification. Skull: Normal. Negative for fracture or focal lesion. Sinuses/Orbits: Mild mucosal thickening in the sinuses Other: None IMPRESSION: 1. Mild motion degradation. 2. Negative for hemorrhage or intracranial mass 3. Interval small age indeterminate left occipital infarct, suspect this may be chronic. Atrophy and extensive chronic small vessel ischemic changes of the white matter. Multiple chronic lacunar infarcts within the thalamus, basal ganglia and right cerebellum. Electronically Signed   By: Donavan Foil M.D.   On: 10/24/2021 22:44   DG Chest Port 1 View  Result Date: 10/24/2021 CLINICAL DATA:  Tremors for 1 week EXAM: PORTABLE CHEST 1 VIEW COMPARISON:  12/22/2016 FINDINGS: Cardiac shadow is within normal limits. The lungs are well aerated bilaterally. No focal infiltrate or sizable effusion is seen. No bony abnormality is noted. IMPRESSION: No active disease. Electronically Signed   By: Inez Catalina M.D.   On: 10/24/2021 21:00   Medications: Infusions:   Scheduled Medications:  Chlorhexidine Gluconate Cloth  6 each Topical Q0600   furosemide  80 mg Intravenous Q12H   heparin  5,000 Units Subcutaneous Q8H   magnesium oxide  800 mg Oral Once   sodium bicarbonate  1,300 mg Oral QID    have reviewed scheduled and prn medications.  Physical Exam: General:  NAD-  tremor is better Heart: RRR Lungs: mostly clear Abdomen: soft, non tender Extremities: pitting edema Dialysis Access: left upper arm AVF-  good thrill and bruit    10/25/2021,9:07 AM  LOS: 1 day

## 2021-10-25 NOTE — Progress Notes (Signed)
Very first dialysis treatment, 17G needles

## 2021-10-25 NOTE — Evaluation (Signed)
Occupational Therapy Evaluation Patient Details Name: Tyler Patterson MRN: [id number] DOB: 23-Dec-1950 Today's Date: 10/25/2021   History of Present Illness 71 y.o. male presents to [facility] hospital on 10/24/2021 with worsening tremors. Work-up concerning for uremia vs lyrica toxicity. PMH includes CKD 5, DMII, diabetic polyneuropathy, HLD, HTN.   Clinical Impression   Pt reported at PLOF used cane vs 4WW and did not go out of the home much. Pt was able to complete bed mobility with mod I and sit to stand transfers with min guard. Pt able to complete UE bathing in sit to stand with min guard and LE ADLS with min guard to min assist. Pt currently with functional limitations due to the deficits listed below (see OT Problem List).  Pt will benefit from skilled OT to increase their safety and independence with ADL and functional mobility for ADL to facilitate discharge to venue listed below.        Recommendations for follow up therapy are one component of a multi-disciplinary discharge planning process, led by the attending physician.  Recommendations may be updated based on patient status, additional functional criteria and insurance authorization.   Follow Up Recommendations  Home health OT    Assistance Recommended at Discharge Frequent or constant Supervision/Assistance  Patient can return home with the following A little help with walking and/or transfers;A little help with bathing/dressing/bathroom    Functional Status Assessment  Patient has had a recent decline in their functional status and demonstrates the ability to make significant improvements in function in a reasonable and predictable amount of time.  Equipment Recommendations  None recommended by OT    Recommendations for Other Services       Precautions / Restrictions Precautions Precautions: Fall Restrictions Weight Bearing Restrictions: No      Mobility Bed Mobility Overal bed mobility: Needs Assistance Bed Mobility:  Supine to Sit, Sit to Supine     Supine to sit: Modified independent (Device/Increase time) Sit to supine: Modified independent (Device/Increase time)        Transfers Overall transfer level: Needs assistance Equipment used: Rolling walker (2 wheels) Transfers: Sit to/from Stand Sit to Stand: Modified independent (Device/Increase time)                  Balance Overall balance assessment: Mild deficits observed, not formally tested                                         ADL either performed or assessed with clinical judgement   ADL Overall ADL's : Needs assistance/impaired Eating/Feeding: Independent;Sitting   Grooming: Wash/dry hands;Wash/dry face;Min guard;Cueing for safety;Cueing for sequencing;Standing   Upper Body Bathing: Min guard;Cueing for sequencing;Cueing for safety;Standing   Lower Body Bathing: Minimal assistance;Cueing for safety;Cueing for sequencing;Sit to/from stand   Upper Body Dressing : Min guard;Sitting;Standing   Lower Body Dressing: Minimal assistance;Cueing for safety;Cueing for sequencing;Sit to/from stand   Toilet Transfer: Min guard;Cueing for safety;Cueing for sequencing;Rolling walker (2 wheels);Regular Toilet;Ambulation   Toileting- Clothing Manipulation and Hygiene: Min guard;Cueing for safety;Cueing for sequencing;Sit to/from stand   Tub/ Shower Transfer: Min guard;Cueing for safety;Cueing for sequencing;Rolling walker (2 wheels)   Functional mobility during ADLs: Min guard;Cueing for safety;Cueing for sequencing;Rolling walker (2 wheels)       Vision         Perception     Praxis      Pertinent Vitals/Pain Pain  Assessment Pain Assessment: No/denies pain     Hand Dominance Right   Extremity/Trunk Assessment Upper Extremity Assessment Upper Extremity Assessment: Overall WFL for tasks assessed   Lower Extremity Assessment Lower Extremity Assessment: Defer to PT evaluation       Communication  Communication Communication: No difficulties   Cognition Arousal/Alertness: Awake/alert Behavior During Therapy: WFL for tasks assessed/performed Overall Cognitive Status: Within Functional Limits for tasks assessed                                       General Comments       Exercises     Shoulder Instructions      Home Living Family/patient expects to be discharged to:: Private residence Living Arrangements: Spouse/significant other Available Help at Discharge: Family Type of Home: House Home Access: Stairs to enter Technical brewer of Steps: 4-5 Entrance Stairs-Rails: Can reach both;Right;Left Home Layout: One level     Bathroom Shower/Tub: Tub/shower unit;Curtain         Home Equipment: Cane - single point;Rollator (4 wheels);Shower seat          Prior Functioning/Environment Prior Level of Function : Independent/Modified Independent             Mobility Comments: uses 4WW vs cane but limited ambulation and does not go out of the home often          OT Problem List: Decreased strength;Decreased range of motion;Decreased activity tolerance;Impaired balance (sitting and/or standing);Decreased safety awareness;Decreased knowledge of use of DME or AE;Cardiopulmonary status limiting activity      OT Treatment/Interventions: Self-care/ADL training;Therapeutic exercise;DME and/or AE instruction;Therapeutic activities;Patient/family education;Balance training    OT Goals(Current goals can be found in the care plan section) Acute Rehab OT Goals Patient Stated Goal: To get stronger OT Goal Formulation: With patient Time For Goal Achievement: 11/08/21 Potential to Achieve Goals: Good ADL Goals Pt Will Perform Upper Body Bathing: Independently;sitting;standing Pt Will Perform Lower Body Bathing: with modified independence;sit to/from stand Pt Will Transfer to Toilet: with modified independence;ambulating;regular height toilet;grab bars Pt  Will Perform Tub/Shower Transfer: with modified independence;ambulating;shower seat;rolling walker  OT Frequency: Min 2X/week    Co-evaluation              AM-PAC OT "6 Clicks" Daily Activity     Outcome Measure Help from another person eating meals?: None Help from another person taking care of personal grooming?: A Little Help from another person toileting, which includes using toliet, bedpan, or urinal?: A Little Help from another person bathing (including washing, rinsing, drying)?: A Little Help from another person to put on and taking off regular upper body clothing?: A Little Help from another person to put on and taking off regular lower body clothing?: A Little 6 Click Score: 19   End of Session Equipment Utilized During Treatment: Gait belt;Rolling walker (2 wheels)  Activity Tolerance: Patient tolerated treatment well Patient left: in bed;with call bell/phone within reach;with nursing/sitter in room  OT Visit Diagnosis: Unsteadiness on feet (R26.81);Other abnormalities of gait and mobility (R26.89);Muscle weakness (generalized) (M62.81)                Time: 1215-1228 OT Time Calculation (min): 13 min Charges:  OT General Charges $OT Visit: 1 Visit OT Evaluation $OT Eval Low Complexity: [place] OTR/L  Acute Rehab Services  [phone] office number [phone] pager number   Joeseph Amor 10/25/2021,  12:44 PM

## 2021-10-25 NOTE — Progress Notes (Signed)
Received patient in bed, alert and oriented. Informed consent signed and in chart.  Tx duration: 2h  HD treatment completed. Patient tolerated well. Fistula without signs and symptoms of complications. Patient transported back to the room, alert and orient and in no acute distress. Report given to bedside RN.  Total UF removed:1000  Medication given:Aranesp  Post HD VS:98.1,177/74,100%,13  Post HD weight: 79.5kg

## 2021-10-26 DIAGNOSIS — N186 End stage renal disease: Secondary | ICD-10-CM | POA: Diagnosis not present

## 2021-10-26 LAB — BASIC METABOLIC PANEL
Anion gap: 14 (ref 5–15)
BUN: 103 mg/dL — ABNORMAL HIGH (ref 8–23)
CO2: 25 mmol/L (ref 22–32)
Calcium: 8.2 mg/dL — ABNORMAL LOW (ref 8.9–10.3)
Chloride: 98 mmol/L (ref 98–111)
Creatinine, Ser: 4.86 mg/dL — ABNORMAL HIGH (ref 0.61–1.24)
GFR, Estimated: 12 mL/min — ABNORMAL LOW (ref >60)
Glucose, Bld: 237 mg/dL — ABNORMAL HIGH (ref 70–99)
Potassium: 3.4 mmol/L — ABNORMAL LOW (ref 3.5–5.1)
Sodium: 137 mmol/L (ref 135–145)

## 2021-10-26 LAB — HEPATITIS B SURFACE ANTIBODY, QUANTITATIVE: Hep B S AB Quant (Post): 133.3 m[IU]/mL (ref >9.9)

## 2021-10-26 LAB — CBC
HCT: 27.8 % — ABNORMAL LOW (ref 39.0–52.0)
Hemoglobin: 9.6 g/dL — ABNORMAL LOW (ref 13.0–17.0)
MCH: 30.2 pg (ref 26.0–34.0)
MCHC: 34.5 g/dL (ref 30.0–36.0)
MCV: 87.4 fL (ref 80.0–100.0)
Platelets: 150 10*3/uL (ref 150–400)
RBC: 3.18 MIL/uL — ABNORMAL LOW (ref 4.22–5.81)
RDW: 14.4 % (ref 11.5–15.5)
WBC: 4.1 10*3/uL (ref 4.0–10.5)
nRBC: 0 % (ref 0.0–0.2)

## 2021-10-26 MED ORDER — CHLORHEXIDINE GLUCONATE CLOTH 2 % EX PADS
6.0000 | MEDICATED_PAD | Freq: Every day | CUTANEOUS | Status: DC
  Administered 2021-10-27 – 2021-10-28 (×2): 6 via TOPICAL

## 2021-10-26 MED ORDER — POTASSIUM CHLORIDE 20 MEQ PO PACK
40.0000 meq | PACK | Freq: Once | ORAL | Status: AC
  Administered 2021-10-26: 40 meq via ORAL
  Filled 2021-10-26: qty 2

## 2021-10-26 NOTE — Plan of Care (Signed)

## 2021-10-26 NOTE — Evaluation (Signed)
Physical Therapy Evaluation Patient Details Name: Tyler Patterson MRN: [id number] DOB: Mar 11, 1951 Today's Date: 10/26/2021  History of Present Illness  71 y.o. male presents to [facility] hospital on 10/24/2021 with worsening tremors. Work-up concerning for uremia vs lyrica toxicity. PMH includes CKD 5, DMII, diabetic polyneuropathy, HLD, HTN.  Clinical Impression  Pt presents with insignificant muscle weakness, but no other problems of concern. Pt tolerates therapy well today, ambulating limited community distances and completing dynamic gait tasks with no c/o fatigue or unsteadiness. Pt reports he has had 2 falls while using his Rollator because he was trying to move too quickly and the Rollator got too far beyond his body. Pt's sister reports that he has a more significant fall hx than just twice. PT educates for proximity to AD and for locking Rollator brakes during transfers. Pt reports mobility feels at baseline, however has difficulty remembering to lock Rollator brakes, despite multiple reminders during ambulation. Continued PT will assist the pt in building strength and continuing AD education for progression of safer mobilization.     Recommendations for follow up therapy are one component of a multi-disciplinary discharge planning process, led by the attending physician.  Recommendations may be updated based on patient status, additional functional criteria and insurance authorization.  Follow Up Recommendations Outpatient PT      Assistance Recommended at Discharge PRN  Patient can return home with the following  Other (comment) (Assist PRN)    Equipment Recommendations None recommended by PT  Recommendations for Other Services       Functional Status Assessment Patient has not had a recent decline in their functional status     Precautions / Restrictions Precautions Precautions: Fall Restrictions Weight Bearing Restrictions: No      Mobility  Bed Mobility Overal bed mobility:  Modified Independent Bed Mobility: Supine to Sit, Sit to Supine     Supine to sit: Modified independent (Device/Increase time) Sit to supine: Modified independent (Device/Increase time)        Transfers Overall transfer level: Needs assistance Equipment used: Rollator (4 wheels) Transfers: Sit to/from Stand Sit to Stand: Supervision           General transfer comment: PT gives education for locking Rollator brakes during transfers    Ambulation/Gait Ambulation/Gait assistance: Modified independent (Device/Increase time) Gait Distance (Feet): 400 Feet Assistive device: Rollator (4 wheels) Gait Pattern/deviations: Step-through pattern, Decreased stride length Gait velocity: decreased Gait velocity interpretation: <1.8 ft/sec, indicate of risk for recurrent falls   General Gait Details: Slowed, but functional gait; Pt reports he has had a couple falls while using his Rollator, and demonstrates how they occurred while he increased speed and pushed rollator away from body - PT educates for safety and body proximity to Rollator; Pt performs dynamic gait tasks well, with slight reduction in speed  Stairs            Wheelchair Mobility    Modified Rankin (Stroke Patients Only)       Balance Overall balance assessment: Mild deficits observed, not formally tested, History of Falls (Ambulates with a Rollator today, but took some steps without AD)                                           Pertinent Vitals/Pain Pain Assessment Pain Assessment: No/denies pain    Home Living Family/patient expects to be discharged to:: Private residence Living Arrangements: Spouse/significant  other Available Help at Discharge: Family Type of Home: House Home Access: Stairs to enter Entrance Stairs-Rails: Can reach both;Right;Left Entrance Stairs-Number of Steps: 4-5   Home Layout: One level Home Equipment: Cane - single point;Rollator (4 wheels);Shower seat       Prior Function Prior Level of Function : Independent/Modified Independent             Mobility Comments: Reports ambulation with a Rollator since November 2022; no AD prior to that ADLs Comments: Reports his wife can provide assistance with ADLs if neccessary, but it hasn't been     Hand Dominance   Dominant Hand: Right    Extremity/Trunk Assessment   Upper Extremity Assessment Upper Extremity Assessment: Overall WFL for tasks assessed    Lower Extremity Assessment Lower Extremity Assessment: Generalized weakness (Not formally tested but likely general weakness, as he used to ambulate without AD)    Cervical / Trunk Assessment Cervical / Trunk Assessment: Normal  Communication   Communication: No difficulties  Cognition Arousal/Alertness: Awake/alert Behavior During Therapy: WFL for tasks assessed/performed Overall Cognitive Status: Within Functional Limits for tasks assessed                                          General Comments General comments (skin integrity, edema, etc.): VSS on RA    Exercises     Assessment/Plan    PT Assessment Patient needs continued PT services  PT Problem List Decreased strength;Decreased safety awareness;Decreased knowledge of use of DME       PT Treatment Interventions DME instruction;Gait training;Stair training;Functional mobility training;Therapeutic activities;Therapeutic exercise;Balance training;Patient/family education    PT Goals (Current goals can be found in the Care Plan section)  Acute Rehab PT Goals Patient Stated Goal: Walk without an AD PT Goal Formulation: With patient Time For Goal Achievement: 11/09/21 Potential to Achieve Goals: Fair    Frequency Min 2X/week     Co-evaluation               AM-PAC PT "6 Clicks" Mobility  Outcome Measure Help needed turning from your back to your side while in a flat bed without using bedrails?: None Help needed moving from lying on your back  to sitting on the side of a flat bed without using bedrails?: None Help needed moving to and from a bed to a chair (including a wheelchair)?: A Little Help needed standing up from a chair using your arms (e.g., wheelchair or bedside chair)?: A Little Help needed to walk in hospital room?: None Help needed climbing 3-5 steps with a railing? : A Little 6 Click Score: 21    End of Session   Activity Tolerance: Patient tolerated treatment well (Reports he feels at baseline) Patient left: in bed;with bed alarm set;with call bell/phone within reach Nurse Communication: Mobility status PT Visit Diagnosis: Difficulty in walking, not elsewhere classified (R26.2);Muscle weakness (generalized) (M62.81)    Time: 6269-4854 PT Time Calculation (min) (ACUTE ONLY): 21 min   Charges:   PT Evaluation $PT Eval Low Complexity: 1 Low          Hall Busing, SPT Acute Rehabilitation Office #: [phone]   Hall Busing 10/26/2021, 1:12 PM

## 2021-10-26 NOTE — Progress Notes (Signed)
Subjective:   s/p first HD yest-  tolerated very well-  used AVF with out issue-  had 3 liters of urine and 1 liter removed with HD - looking at his phone-  NAD at all -  he said " something made me sh**  !! " Likely the magnesium    Objective Vital signs in last 24 hours: Vitals:   10/25/21 1500 10/25/21 2036 10/26/21 0048 10/26/21 0417  BP: (!) 172/73 (!) 149/60 (!) 149/74 134/67  Pulse: 66 64 63 65  Resp: 14 12 15 13   Temp: 98.1 F (36.7 C) (!) 97.5 F (36.4 C) 97.6 F (36.4 C) 97.8 F (36.6 C)  TempSrc:  Oral Oral Oral  SpO2: 100% 100% 100% 100%  Weight: 79.5 kg  78.4 kg   Height:       Weight change: -0.8 kg  Intake/Output Summary (Last 24 hours) at 10/26/2021 8657 Last data filed at 10/26/2021 0556 Gross per 24 hour  Intake 300 ml  Output 4175 ml  Net -3875 ml    Assessment/Plan: 71 year old BM with advanced CKD known to CK[facility]-  has AVF in place presenting with worsening tremors over the last few days  Tremors-  the differential would include uremia or the build up of lyrica in his system from decreased renal clearance.  The fact that he does not have N/V or a more altered MS makes me think the latter.  His tremors are better off of lyrica but BUN was 155   prompting initiation of HD  2.Renal- advanced CKD at baseline with AVF in place and mature.  Labs showing BUN of 155-  is also volume overloaded.  I discussed that I think the best course of action is to initiate dialysis -  he is agreeable-  first HD 7/29, second tomorrow-  then will need to get CLIP and education on Monday as well -  since pt looks so great and I dont anticipate trouble locating OP unit possibly could go home late Monday ???  3. Hypertension/volume  - BP seems reasonable but he is overloaded-   holding any BP meds and am attempting to diurese with IV lasix, not very successful initially but now pretty good-    UF with HD and stop diuretics at discharge 4. Anemia  - is on ESA as OP- last iron sat good on 7/13-   continue ESA  5. Bones-  phos 8.5-  started binder and also check PTH ( pending) 6. Metabolic acidosis-  on po bicarb as OP -  now that starting HD will stop  7. Hypokalemia and hypomag-  from diuresis-  give K today-  did not tolerate mag ox-  high k bath with HD      Louis Meckel    Labs: Basic Metabolic Panel: Recent Labs  Lab 10/24/21 2014 10/25/21 0558 10/26/21 0437  NA 138 140 137  K 4.4 3.8 3.4*  CL 106 106 98  CO2 15* 18* 25  GLUCOSE 139* 104* 237*  BUN 155* 153* 103*  CREATININE 6.36* 6.22* 4.86*  CALCIUM 8.0* 8.1* 8.2*  PHOS 8.7* 8.5*  --    Liver Function Tests: Recent Labs  Lab 10/24/21 2014 10/25/21 0558  AST 23 20  ALT 23 24  ALKPHOS 55 55  BILITOT 1.1 0.8  PROT 6.5 6.5  ALBUMIN 3.6 3.4*   No results for input(s): "LIPASE", "AMYLASE" in the last 168 hours. No results for input(s): "AMMONIA" in the last 168 hours. CBC:  Recent Labs  Lab 10/24/21 2014 10/25/21 0558 10/26/21 0437  WBC 4.2 3.8* 4.1  NEUTROABS 2.5 1.9  --   HGB 8.7* 8.8* 9.6*  HCT 26.3* 25.8* 27.8*  MCV 91.0 88.7 87.4  PLT 128* 146* 150   Cardiac Enzymes: No results for input(s): "CKTOTAL", "CKMB", "CKMBINDEX", "TROPONINI" in the last 168 hours. CBG: Recent Labs  Lab 10/25/21 0625  GLUCAP 106*    Iron Studies: No results for input(s): "IRON", "TIBC", "TRANSFERRIN", "FERRITIN" in the last 72 hours. Studies/Results: CT Head Wo Contrast  Result Date: 10/24/2021 CLINICAL DATA:  Seizure EXAM: CT HEAD WITHOUT CONTRAST TECHNIQUE: Contiguous axial images were obtained from the base of the skull through the vertex without intravenous contrast. RADIATION DOSE REDUCTION: This exam was performed according to the departmental dose-optimization program which includes automated exposure control, adjustment of the mA and/or kV according to patient size and/or use of iterative reconstruction technique. COMPARISON:  MRI 04/18/2021, CT brain 02/27/2017 FINDINGS: Brain: No hemorrhage or  intracranial mass. There is motion degradation. Small interval age indeterminate left occipital infarct, series 3, image 16, sagittal series 6, image 39. Extensive chronic small vessel ischemic changes of the white matter. Chronic right cerebellar infarct. Chronic lacunar infarcts within the thalamus and bilateral basal ganglia. Ventricles are nonenlarged. Vascular: No hyperdense vessels. Vertebral and carotid vascular calcification. Skull: Normal. Negative for fracture or focal lesion. Sinuses/Orbits: Mild mucosal thickening in the sinuses Other: None IMPRESSION: 1. Mild motion degradation. 2. Negative for hemorrhage or intracranial mass 3. Interval small age indeterminate left occipital infarct, suspect this may be chronic. Atrophy and extensive chronic small vessel ischemic changes of the white matter. Multiple chronic lacunar infarcts within the thalamus, basal ganglia and right cerebellum. Electronically Signed   By: Donavan Foil M.D.   On: 10/24/2021 22:44   DG Chest Port 1 View  Result Date: 10/24/2021 CLINICAL DATA:  Tremors for 1 week EXAM: PORTABLE CHEST 1 VIEW COMPARISON:  12/22/2016 FINDINGS: Cardiac shadow is within normal limits. The lungs are well aerated bilaterally. No focal infiltrate or sizable effusion is seen. No bony abnormality is noted. IMPRESSION: No active disease. Electronically Signed   By: Inez Catalina M.D.   On: 10/24/2021 21:00   Medications: Infusions:   Scheduled Medications:  calcium acetate  1,334 mg Oral TID WC   Chlorhexidine Gluconate Cloth  6 each Topical Q0600   darbepoetin (ARANESP) injection - DIALYSIS  200 mcg Intravenous Q Sat-HD   furosemide  80 mg Intravenous Q12H   heparin  5,000 Units Subcutaneous Q8H   magnesium oxide  800 mg Oral Once    have reviewed scheduled and prn medications.  Physical Exam: General:  NAD-  tremor is better Heart: RRR Lungs: mostly clear Abdomen: soft, non tender Extremities: pitting edema Dialysis Access: left upper  arm AVF-  good thrill and bruit --  no bruising    10/26/2021,8:23 AM  LOS: 2 days

## 2021-10-26 NOTE — Progress Notes (Signed)
PROGRESS NOTE    Tyler Patterson  BBC:[id number] DOB: Aug 13, 1950 DOA: 10/24/2021 PCP: Jilda Panda, MD   Brief Narrative:  [place] Tyler Patterson is a 71 y.o. male with medical history significant for CKD 5 status post left second stage brachiobasilic AV fistula placement on 07/28/2021, insulin-dependent type 2 diabetes, diabetic polyneuropathy on Lyrica, hyperlipidemia, hypertension, who presented to [facility] ED from home via EMS due to progressively worsening tremors for the past week.    Assessment & Plan:   Principal Problem:   ESRD (end stage renal disease) ([facility]) Active Problems:   HTN (hypertension)   Fall   GERD (gastroesophageal reflux disease)   Diabetes mellitus type 2 in nonobese (HCC)   Uremia   CKD (chronic kidney disease) stage 5, GFR less than 15 ml/min (HCC)  AKI on CKD 5 transitioning to ESRD, new onset  Concurrent symptomatic uremia, acute -Nephrology following, status post fistula placement earlier this year given worsening kidney function over the past few years -Tolerating dialysis via left upper extremity fistula -Discussed renal diet at length -Ultimate disposition pending clipping  Ambulatory dysfunction, acute on chronic Tremors, secondary to above versus polypharmacy -Previously undergoing PT OT outpatient, markedly weak comparatively in the setting of above -PT OT to follow for ultimate recommendations prior to discharge; may benefit from placement versus more aggressive therapy at home or outpatient  High anion gap metabolic acidosis, resolved Secondary to above  Hypomagnesemia/hypophosphatemia Follow along with nephrology  Hypertension, stable. Initiated on IV Lasix, high-dose at intake, defer to nephrology on further indications of diuretics in a dialysis patient   Hyperlipidemia Resume home Lipitor.   Type 2 diabetes uncontrolled with hyperglycemia -A1c 8.3 -Continue sliding scale insulin, hypoglycemic protocol -Dietary to follow as above, continue  diabetic diet as appropriate   Diabetic polyneuropathy -Holding home Lyrica  Generalized weakness PT OT to assess Fall precautions.   DVT prophylaxis: Heparin subcutaneous Code Status: Full Family Communication: Sister at bedside  Status is: Inpatient  Dispo: The patient is from: Home              Anticipated d/c is to: To be determined              Anticipated d/c date is: 48 to 72 hours              Patient currently not medically stable for discharge  Consultants:  Nephrology  Procedures:  Hemodialysis initiation  Antimicrobials:  None  Subjective: No acute issues or events overnight denies nausea vomiting diarrhea constipation any fevers chills or chest pain  Objective: Vitals:   10/25/21 1500 10/25/21 2036 10/26/21 0048 10/26/21 0417  BP: (!) 172/73 (!) 149/60 (!) 149/74 134/67  Pulse: 66 64 63 65  Resp: 14 12 15 13   Temp: 98.1 F (36.7 C) (!) 97.5 F (36.4 C) 97.6 F (36.4 C) 97.8 F (36.6 C)  TempSrc:  Oral Oral Oral  SpO2: 100% 100% 100% 100%  Weight: 79.5 kg  78.4 kg   Height:        Intake/Output Summary (Last 24 hours) at 10/26/2021 0744 Last data filed at 10/26/2021 0556 Gross per 24 hour  Intake 300 ml  Output 4175 ml  Net -3875 ml    Filed Weights   10/25/21 0222 10/25/21 1500 10/26/21 0048  Weight: 80.3 kg 79.5 kg 78.4 kg    Examination:  General:  Pleasantly resting in bed, No acute distress. HEENT:  Normocephalic atraumatic.  Sclerae nonicteric, noninjected.  Extraocular movements intact bilaterally. Neck:  Without  mass or deformity.  Trachea is midline. Lungs:  Clear to auscultate bilaterally without rhonchi, wheeze, or rales. Heart:  Regular rate and rhythm.  Without murmurs, rubs, or gallops. Abdomen:  Soft, nontender, nondistended.  Without guarding or rebound. Extremities: Without cyanosis, clubbing, edema, or obvious deformity. Vascular: Left AC fistula palpable thrill. Skin:  Warm and dry, no erythema, no  ulcerations.   Data Reviewed: I have personally reviewed following labs and imaging studies  CBC: Recent Labs  Lab 10/23/21 0838 10/24/21 2014 10/25/21 0558 10/26/21 0437  WBC  --  4.2 3.8* 4.1  NEUTROABS  --  2.5 1.9  --   HGB 8.7* 8.7* 8.8* 9.6*  HCT  --  26.3* 25.8* 27.8*  MCV  --  91.0 88.7 87.4  PLT  --  128* 146* 373    Basic Metabolic Panel: Recent Labs  Lab 10/24/21 2014 10/25/21 0558 10/26/21 0437  NA 138 140 137  K 4.4 3.8 3.4*  CL 106 106 98  CO2 15* 18* 25  GLUCOSE 139* 104* 237*  BUN 155* 153* 103*  CREATININE 6.36* 6.22* 4.86*  CALCIUM 8.0* 8.1* 8.2*  MG 1.2* 1.4*  --   PHOS 8.7* 8.5*  --     GFR: Estimated Creatinine Clearance: 13.5 mL/min (A) (by C-G formula based on SCr of 4.86 mg/dL (H)). Liver Function Tests: Recent Labs  Lab 10/24/21 2014 10/25/21 0558  AST 23 20  ALT 23 24  ALKPHOS 55 55  BILITOT 1.1 0.8  PROT 6.5 6.5  ALBUMIN 3.6 3.4*    No results for input(s): "LIPASE", "AMYLASE" in the last 168 hours. No results for input(s): "AMMONIA" in the last 168 hours. Coagulation Profile: No results for input(s): "INR", "PROTIME" in the last 168 hours. Cardiac Enzymes: No results for input(s): "CKTOTAL", "CKMB", "CKMBINDEX", "TROPONINI" in the last 168 hours. BNP (last 3 results) No results for input(s): "PROBNP" in the last 8760 hours. HbA1C: No results for input(s): "HGBA1C" in the last 72 hours. CBG: Recent Labs  Lab 10/25/21 0625  GLUCAP 106*    Lipid Profile: No results for input(s): "CHOL", "HDL", "LDLCALC", "TRIG", "CHOLHDL", "LDLDIRECT" in the last 72 hours. Thyroid Function Tests: No results for input(s): "TSH", "T4TOTAL", "FREET4", "T3FREE", "THYROIDAB" in the last 72 hours. Anemia Panel: No results for input(s): "VITAMINB12", "FOLATE", "FERRITIN", "TIBC", "IRON", "RETICCTPCT" in the last 72 hours. Sepsis Labs: No results for input(s): "PROCALCITON", "LATICACIDVEN" in the last 168 hours.  No results found for this  or any previous visit (from the past 240 hour(s)).       Radiology Studies: CT Head Wo Contrast  Result Date: 10/24/2021 CLINICAL DATA:  Seizure EXAM: CT HEAD WITHOUT CONTRAST TECHNIQUE: Contiguous axial images were obtained from the base of the skull through the vertex without intravenous contrast. RADIATION DOSE REDUCTION: This exam was performed according to the departmental dose-optimization program which includes automated exposure control, adjustment of the mA and/or kV according to patient size and/or use of iterative reconstruction technique. COMPARISON:  MRI 04/18/2021, CT brain 02/27/2017 FINDINGS: Brain: No hemorrhage or intracranial mass. There is motion degradation. Small interval age indeterminate left occipital infarct, series 3, image 16, sagittal series 6, image 39. Extensive chronic small vessel ischemic changes of the white matter. Chronic right cerebellar infarct. Chronic lacunar infarcts within the thalamus and bilateral basal ganglia. Ventricles are nonenlarged. Vascular: No hyperdense vessels. Vertebral and carotid vascular calcification. Skull: Normal. Negative for fracture or focal lesion. Sinuses/Orbits: Mild mucosal thickening in the sinuses Other: None IMPRESSION: 1.  Mild motion degradation. 2. Negative for hemorrhage or intracranial mass 3. Interval small age indeterminate left occipital infarct, suspect this may be chronic. Atrophy and extensive chronic small vessel ischemic changes of the white matter. Multiple chronic lacunar infarcts within the thalamus, basal ganglia and right cerebellum. Electronically Signed   By: Donavan Foil M.D.   On: 10/24/2021 22:44   DG Chest Port 1 View  Result Date: 10/24/2021 CLINICAL DATA:  Tremors for 1 week EXAM: PORTABLE CHEST 1 VIEW COMPARISON:  12/22/2016 FINDINGS: Cardiac shadow is within normal limits. The lungs are well aerated bilaterally. No focal infiltrate or sizable effusion is seen. No bony abnormality is noted. IMPRESSION: No  active disease. Electronically Signed   By: Inez Catalina M.D.   On: 10/24/2021 21:00    Scheduled Meds:  calcium acetate  1,334 mg Oral TID WC   Chlorhexidine Gluconate Cloth  6 each Topical Q0600   darbepoetin (ARANESP) injection - DIALYSIS  200 mcg Intravenous Q Sat-HD   furosemide  80 mg Intravenous Q12H   heparin  5,000 Units Subcutaneous Q8H   magnesium oxide  800 mg Oral Once   Continuous Infusions:   LOS: 2 days   Time spent: 92min  Talia Hoheisel C Rajanee Schuelke, DO [facility]  If 7PM-7AM, please contact night-coverage [email or web address]  10/26/2021, 7:44 AM

## 2021-10-27 DIAGNOSIS — N186 End stage renal disease: Secondary | ICD-10-CM | POA: Diagnosis not present

## 2021-10-27 LAB — BASIC METABOLIC PANEL
Anion gap: 14 (ref 5–15)
BUN: 99 mg/dL — ABNORMAL HIGH (ref 8–23)
CO2: 24 mmol/L (ref 22–32)
Calcium: 8.7 mg/dL — ABNORMAL LOW (ref 8.9–10.3)
Chloride: 97 mmol/L — ABNORMAL LOW (ref 98–111)
Creatinine, Ser: 4.95 mg/dL — ABNORMAL HIGH (ref 0.61–1.24)
GFR, Estimated: 12 mL/min — ABNORMAL LOW (ref >60)
Glucose, Bld: 218 mg/dL — ABNORMAL HIGH (ref 70–99)
Potassium: 3.8 mmol/L (ref 3.5–5.1)
Sodium: 135 mmol/L (ref 135–145)

## 2021-10-27 LAB — GLUCOSE, CAPILLARY
Glucose-Capillary: 209 mg/dL — ABNORMAL HIGH (ref 70–99)
Glucose-Capillary: 204 mg/dL — ABNORMAL HIGH (ref 70–99)
Glucose-Capillary: 270 mg/dL — ABNORMAL HIGH (ref 70–99)
Glucose-Capillary: 299 mg/dL — ABNORMAL HIGH (ref 70–99)

## 2021-10-27 LAB — CBC
HCT: 29.6 % — ABNORMAL LOW (ref 39.0–52.0)
Hemoglobin: 10 g/dL — ABNORMAL LOW (ref 13.0–17.0)
MCH: 30 pg (ref 26.0–34.0)
MCHC: 33.8 g/dL (ref 30.0–36.0)
MCV: 88.9 fL (ref 80.0–100.0)
Platelets: 173 10*3/uL (ref 150–400)
RBC: 3.33 MIL/uL — ABNORMAL LOW (ref 4.22–5.81)
RDW: 14.5 % (ref 11.5–15.5)
WBC: 5.1 10*3/uL (ref 4.0–10.5)
nRBC: 0 % (ref 0.0–0.2)

## 2021-10-27 LAB — PARATHYROID HORMONE, INTACT (NO CA): PTH: 104 pg/mL — ABNORMAL HIGH (ref 15–65)

## 2021-10-27 NOTE — Progress Notes (Signed)
Mobility Specialist Progress Note:   10/27/21 1202  Mobility  Activity Ambulated with assistance in hallway  Level of Assistance Standby assist, set-up cues, supervision of patient - no hands on  Assistive Device Four wheel walker  Distance Ambulated (ft) 400 ft  Activity Response Tolerated well  $Mobility charge 1 Mobility   Pt received in bed willing to participate in mobility. No complaints of pain. Left in chair with call bell in reach and all needs met.   [facility] Margerie Fraiser Mobility Specialist

## 2021-10-27 NOTE — Progress Notes (Signed)
NUTRITION NOTE  RD consulted for nutrition education regarding diabetes and renal diet for patient who is new on iHD; first session was on 7/30.   Lab Results  Component Value Date   HGBA1C 8.3 (A) 10/13/2021    RD provided "Carbohydrate Counting for People with Diabetes", "Fluid Restriction Nutrition Therapy", "Fluid Content of Common Foods", "Protein for Dialysis" and "Nutrition for People on Dialysis" handouts from the Academy of [employer] and Dietetics. Discussed different food groups and their effects on blood sugar, emphasizing carbohydrate-containing foods. Provided list of carbohydrates and recommended serving sizes of common foods.  Discussed importance of controlled and consistent carbohydrate intake throughout the day. Provided examples of ways to balance meals/snacks and encouraged intake of high-fiber, whole grain complex carbohydrates.   Discussed current fluid restriction, what counts as a fluid, and that fluid restriction may change.  Discussed labs that will be monitor concerning iHD and nutrition and that patient may need to restrict certain foods and beverages for a period of time based on these labs. Shared with him that a RD will be on site at the dialysis clinic and will be available for any questions that he may have.  Patient shares that he was previously checking CBGs and that they were usually 150-160 mg/dl. His meter broke ~3 weeks ago so he has been unable to check.   He was not monitoring anything concerning diet PTA.  Teach back method used.  Expect fair to good compliance due to diet being new to patient.  Body mass index is 25.07 kg/m. Pt meets criteria for overweight status based on current BMI.  Current diet order is Renal, Carb Modified diet with 1.2 L fluid restriction, patient is consuming approximately 100% of meals at this time. Labs and medications reviewed.    No further nutrition interventions warranted at this time. RD contact information  provided. If additional nutrition issues arise, please re-consult RD.      Jarome Matin, MS, RD, LDN, [facility] Registered Dietitian II Inpatient Clinical Nutrition RD pager # and on-call/weekend pager # available in [facility]

## 2021-10-27 NOTE — Progress Notes (Addendum)
Requested to see pt for out-pt HD needs at d/c. Met with pt at bedside while in HD unit. Introduced self and explained role. Pt voices interest in home HD. Pt resides here in [place] and discussed option of TCU at [place] (4x's a week with education on home HD). Pt agreeable to consider this option. Pt advised of 4x's week treatment and agreeable to proceed. Referral made to [facility] admissions. Pt states that sister will assist with transportation to out-pt HD appts but may need resources on transportation in the future. Will assist as needed.  Melven Sartorius Renal Navigator [phone]  Addendum at 5:02 pm: Pt has been accepted at [facility] on Monday/Tuesday/Thursday/Friday with 8:00 chair time. Pt can start tomorrow and needs to arrive at 7:30 to complete paperwork prior to treatment. Update provided to attending, RN, and nephrologist. Plan is for early d/c tomorrow morning in order for pt to start at [facility] tomorrow. Met with pt at bedside to discuss above arrangements. Schedule letter provided as well. Pt agreeable to plan and contacted his sister, Doroteo Bradford, to discuss arrangements. Sister has concerns regarding arrangements (4x's a week, transportation,pt being able to do HD at home) and requests to speak to physicians and case manager (for transportation options/resources). Secure chat sent to attending and nephrologist with sister's request (pt agreeable to physicians calling sister) to speak to them further. Clinic advised pt may or may not be at 7:30 am appt tomorrow and renal PA sent orders to TCU in the event pt was to make appt. Nephrologist spoke to pt's sister and to speak with pt tomorrow regarding concerns of pt being able to do home therapy and would pt agree to in-center treatment. Will f/u with pt tomorrow as well.

## 2021-10-27 NOTE — Progress Notes (Signed)
New Dialysis Start   Patient identified as new dialysis start. Kidney Education packet assembled and given. Discussed the following items with patient:    Current medications and possible changes once started:  Discussed that patient's medications may change over time.  Ex; hypertension medications and diabetes medication.  Nephrologists will adjust as needed.  Fluid restrictions reviewed:  32 oz daily goal:  All liquids count; soups, ice, jello   Phosphorus and potassium: Handout given showing high potassium and phosphorus foods.  Alternative food and drink options given.  Family support:  no family at bedside  Outpatient Clinic Resources:  Discussed roles of Outpatient clinic  staff and advised to make a list of needs, if any, to talk with outpatient staff if needed  Care plan schedule: Informed patient and family member of Care Plans in outpatient setting and to participate in the care plan.  An invitation would be given from outpatient clinic.   Dialysis Access Options:  Reviewed access options with patients. Discussed in detail about care at home with new AVG & AVF. Reviewed checking bruit and thrill. If dialysis catheter present, educated that patient could not take showers.  Catheter dressing changes were to be done by outpatient clinic staff only  Home therapy options:  Educated patient about home therapy options:  PD vs home hemo.  Patient stated he is interested at this time in home therapies.  Olivia Mackie, renal navigator will clip patient to TCU.   Patient verbalized understanding. Will continue to round on patient during admission.    Lilia Argue, RN

## 2021-10-27 NOTE — Progress Notes (Signed)
PROGRESS NOTE    ORION MOLE  HER:[id number] DOB: [birth year] DOA: 10/24/2021 PCP: Jilda Panda, MD   Brief Narrative:  Tyler Patterson is a 71 y.o. male with medical history significant for CKD 5 status post left second stage brachiobasilic AV fistula placement on 07/28/2021, insulin-dependent type 2 diabetes, diabetic polyneuropathy on Lyrica, hyperlipidemia, hypertension, who presented to [facility] ED from home via EMS due to progressively worsening tremors for the past week.    Assessment & Plan:   Principal Problem:   ESRD (end stage renal disease) ([place]) Active Problems:   HTN (hypertension)   Fall   GERD (gastroesophageal reflux disease)   Diabetes mellitus type 2 in nonobese (HCC)   Uremia   CKD (chronic kidney disease) stage 5, GFR less than 15 ml/min (HCC)  AKI on CKD 5 transitioning to ESRD, new onset  Concurrent symptomatic uremia, acute -Nephrology following, status post fistula placement earlier this year given worsening kidney function over the past few years -Tolerating dialysis via left upper extremity fistula -Discussed renal diet at length -Ultimate disposition pending clipping -clinically improved, appears to be back to baseline  Ambulatory dysfunction, acute on chronic Tremors, secondary to above versus polypharmacy -Previously undergoing PT OT outpatient, markedly weak comparatively in the setting of above -PT OT to follow for ultimate recommendations prior to discharge; may benefit from placement versus more aggressive therapy at home or outpatient  High anion gap metabolic acidosis, resolved Secondary to above  Hypomagnesemia/hypophosphatemia Follow along with nephrology  Hypertension, stable. Initiated on IV Lasix, high-dose at intake, defer to nephrology on further indications of diuretics in a dialysis patient   Hyperlipidemia Resume home Lipitor.   Type 2 diabetes uncontrolled with hyperglycemia -A1c 8.3 -Continue sliding scale insulin, hypoglycemic  protocol -Dietary to follow as above, continue diabetic diet as appropriate   Diabetic polyneuropathy -Holding home Lyrica  Generalized weakness PT OT to assess Fall precautions.   DVT prophylaxis: Heparin subcutaneous Code Status: Full Family Communication: Sister at bedside  Status is: Inpatient  Dispo: The patient is from: Home              Anticipated d/c is to: To be determined              Anticipated d/c date is: 48 to 72 hours              Patient currently not medically stable for discharge  Consultants:  Nephrology  Procedures:  Hemodialysis   Antimicrobials:  None  Subjective: No acute issues or events overnight denies nausea vomiting diarrhea constipation any fevers chills or chest pain  Objective: Vitals:   10/27/21 0700 10/27/21 0715 10/27/21 0730 10/27/21 0800  BP: (!) 157/68 (!) 151/56 (!) 154/60 (!) 138/59  Pulse: 64 (!) 57 (!) 58 60  Resp: 19 10 10 10   Temp: 97.8 F (36.6 C)     TempSrc: Oral     SpO2: 99% 99% 100% 100%  Weight:      Height:        Intake/Output Summary (Last 24 hours) at 10/27/2021 8563 Last data filed at 10/26/2021 2100 Gross per 24 hour  Intake 711 ml  Output 1500 ml  Net -789 ml    Filed Weights   10/25/21 1500 10/26/21 0048 10/27/21 0611  Weight: 79.5 kg 78.4 kg 74.9 kg    Examination:  General:  Pleasantly resting in bed, No acute distress. HEENT:  Normocephalic atraumatic.  Sclerae nonicteric, noninjected.  Extraocular movements intact bilaterally. Neck:  Without mass  or deformity.  Trachea is midline. Lungs:  Clear to auscultate bilaterally without rhonchi, wheeze, or rales. Heart:  Regular rate and rhythm.  Without murmurs, rubs, or gallops. Abdomen:  Soft, nontender, nondistended.  Without guarding or rebound. Extremities: Without cyanosis, clubbing, edema, or obvious deformity. Vascular: Left AC fistula palpable thrill. Skin:  Warm and dry, no erythema, no ulcerations.   Data Reviewed: I have  personally reviewed following labs and imaging studies  CBC: Recent Labs  Lab 10/23/21 0838 10/24/21 2014 10/25/21 0558 10/26/21 0437 10/27/21 0726  WBC  --  4.2 3.8* 4.1 5.1  NEUTROABS  --  2.5 1.9  --   --   HGB 8.7* 8.7* 8.8* 9.6* 10.0*  HCT  --  26.3* 25.8* 27.8* 29.6*  MCV  --  91.0 88.7 87.4 88.9  PLT  --  128* 146* 150 102    Basic Metabolic Panel: Recent Labs  Lab 10/24/21 2014 10/25/21 0558 10/26/21 0437  NA 138 140 137  K 4.4 3.8 3.4*  CL 106 106 98  CO2 15* 18* 25  GLUCOSE 139* 104* 237*  BUN 155* 153* 103*  CREATININE 6.36* 6.22* 4.86*  CALCIUM 8.0* 8.1* 8.2*  MG 1.2* 1.4*  --   PHOS 8.7* 8.5*  --     GFR: Estimated Creatinine Clearance: 13.5 mL/min (A) (by C-G formula based on SCr of 4.86 mg/dL (H)). Liver Function Tests: Recent Labs  Lab 10/24/21 2014 10/25/21 0558  AST 23 20  ALT 23 24  ALKPHOS 55 55  BILITOT 1.1 0.8  PROT 6.5 6.5  ALBUMIN 3.6 3.4*    No results for input(s): "LIPASE", "AMYLASE" in the last 168 hours. No results for input(s): "AMMONIA" in the last 168 hours. Coagulation Profile: No results for input(s): "INR", "PROTIME" in the last 168 hours. Cardiac Enzymes: No results for input(s): "CKTOTAL", "CKMB", "CKMBINDEX", "TROPONINI" in the last 168 hours. BNP (last 3 results) No results for input(s): "PROBNP" in the last 8760 hours. HbA1C: No results for input(s): "HGBA1C" in the last 72 hours. CBG: Recent Labs  Lab 10/25/21 0625 10/27/21 0608  GLUCAP 106* 209*    Lipid Profile: No results for input(s): "CHOL", "HDL", "LDLCALC", "TRIG", "CHOLHDL", "LDLDIRECT" in the last 72 hours. Thyroid Function Tests: No results for input(s): "TSH", "T4TOTAL", "FREET4", "T3FREE", "THYROIDAB" in the last 72 hours. Anemia Panel: No results for input(s): "VITAMINB12", "FOLATE", "FERRITIN", "TIBC", "IRON", "RETICCTPCT" in the last 72 hours. Sepsis Labs: No results for input(s): "PROCALCITON", "LATICACIDVEN" in the last 168  hours.  No results found for this or any previous visit (from the past 240 hour(s)).       Radiology Studies: No results found.  Scheduled Meds:  calcium acetate  1,334 mg Oral TID WC   Chlorhexidine Gluconate Cloth  6 each Topical Q0600   darbepoetin (ARANESP) injection - DIALYSIS  200 mcg Intravenous Q Sat-HD   furosemide  80 mg Intravenous Q12H   heparin  5,000 Units Subcutaneous Q8H   magnesium oxide  800 mg Oral Once   Continuous Infusions:   LOS: 3 days   Time spent: 98min  Heyward Douthit C Dystany Duffy, DO [facility]  If 7PM-7AM, please contact night-coverage [email or web address]  10/27/2021, 8:12 AM

## 2021-10-27 NOTE — Inpatient Diabetes Management (Signed)
Inpatient Diabetes Program Recommendations  AACE/ADA: New Consensus Statement on Inpatient Glycemic Control (2015)  Target Ranges:  Prepandial:   less than 140 mg/dL      Peak postprandial:   less than 180 mg/dL (1-2 hours)      Critically ill patients:  140 - 180 mg/dL   Lab Results  Component Value Date   GLUCAP 204 (H) 10/27/2021   HGBA1C 8.3 (A) 10/13/2021    Review of Glycemic Control  Latest Reference Range & Units 10/27/21 11:23  Glucose-Capillary 70 - 99 mg/dL 204 (H)  (H): Data is abnormally high Diabetes history: Type 2 DM Outpatient Diabetes medications: Humalog 1-5 units TID Current orders for Inpatient glycemic control: none  Inpatient Diabetes Program Recommendations:    Consider adding Novolog 0-6 units TID & HS.  Thanks, Bronson Curb, MSN, RNC-OB Diabetes Coordinator [phone] (8a-5p)

## 2021-10-27 NOTE — Progress Notes (Addendum)
Subjective:   s/p first HD yest-  tolerated very well-  used AVF with out issue-  had 3 liters of urine and 1 liter removed with HD - looking at his phone-  NAD at all -  he said " something made me sh**  !! " Likely the magnesium    Objective Vital signs in last 24 hours: Vitals:   10/27/21 0715 10/27/21 0730 10/27/21 0800 10/27/21 0830  BP: (!) 151/56 (!) 154/60 (!) 138/59 134/69  Pulse: (!) 57 (!) 58 60 (!) 59  Resp: 10 10 10 12   Temp:      TempSrc:      SpO2: 99% 100% 100% 100%  Weight:      Height:       Weight change: -4.566 kg  Intake/Output Summary (Last 24 hours) at 10/27/2021 6834 Last data filed at 10/26/2021 2100 Gross per 24 hour  Intake 591 ml  Output 1200 ml  Net -609 ml    Assessment/Plan: 71 year old BM with advanced CKD known to CKA-  has AVF in place presenting with worsening tremors over the last few days  Tremors-  the differential would include uremia or the build up of lyrica in his system from decreased renal clearance.  The fact that he does not have N/V or a more altered MS makes me think the latter.  His tremors are better off of lyrica but BUN was 155   prompting initiation of HD  2.Renal dysfunction with very advanced CKD which has now progressed to ESRD - advanced CKD at baseline with AVF in place and mature.  Labs showing BUN of 155-  is also volume overloaded.  I discussed that I think the best course of action is to initiate dialysis -  he is agreeable-  first HD 7/29, second today (seen on HD, tolerating, high AP sometimes and he keeps on asking to go home) -  CLIP (renal navigator notified) and education on Monday as well -  pt looks great and I dont anticipate trouble locating OP unit   Seen on HD 4K bath, AP alarm intermittently going off, goal UF 1L tolerating 135/93  Next HD Wed.  3. Hypertension/volume  - BP seems reasonable but he is overloaded-   holding any BP meds and am attempting to diurese with IV lasix, not very successful initially but  now pretty good-    UF with HD and stop diuretics at discharge 4. Anemia  - is on ESA as OP- last iron sat good on 7/13-  continue ESA  5. Bones-  phos 8.5-  started binder and also check PTH ( pending) 6. Metabolic acidosis-   now that on HD stopped oral hco3 7. Hypokalemia and hypomag-  from diuresis-  gave K-  high k bath with HD      Dwana Melena    Labs: Basic Metabolic Panel: Recent Labs  Lab 10/24/21 2014 10/25/21 0558 10/26/21 0437 10/27/21 0726  NA 138 140 137 135  K 4.4 3.8 3.4* 3.8  CL 106 106 98 97*  CO2 15* 18* 25 24  GLUCOSE 139* 104* 237* 218*  BUN 155* 153* 103* 99*  CREATININE 6.36* 6.22* 4.86* 4.95*  CALCIUM 8.0* 8.1* 8.2* 8.7*  PHOS 8.7* 8.5*  --   --    Liver Function Tests: Recent Labs  Lab 10/24/21 2014 10/25/21 0558  AST 23 20  ALT 23 24  ALKPHOS 55 55  BILITOT 1.1 0.8  PROT 6.5 6.5  ALBUMIN  3.6 3.4*   No results for input(s): "LIPASE", "AMYLASE" in the last 168 hours. No results for input(s): "AMMONIA" in the last 168 hours. CBC: Recent Labs  Lab 10/24/21 2014 10/25/21 0558 10/26/21 0437 10/27/21 0726  WBC 4.2 3.8* 4.1 5.1  NEUTROABS 2.5 1.9  --   --   HGB 8.7* 8.8* 9.6* 10.0*  HCT 26.3* 25.8* 27.8* 29.6*  MCV 91.0 88.7 87.4 88.9  PLT 128* 146* 150 173   Cardiac Enzymes: No results for input(s): "CKTOTAL", "CKMB", "CKMBINDEX", "TROPONINI" in the last 168 hours. CBG: Recent Labs  Lab 10/25/21 0625 10/27/21 0608  GLUCAP 106* 209*    Iron Studies: No results for input(s): "IRON", "TIBC", "TRANSFERRIN", "FERRITIN" in the last 72 hours. Studies/Results: No results found. Medications: Infusions:   Scheduled Medications:  calcium acetate  1,334 mg Oral TID WC   Chlorhexidine Gluconate Cloth  6 each Topical Q0600   darbepoetin (ARANESP) injection - DIALYSIS  200 mcg Intravenous Q Sat-HD   furosemide  80 mg Intravenous Q12H   heparin  5,000 Units Subcutaneous Q8H   magnesium oxide  800 mg Oral Once    have reviewed  scheduled and prn medications.  Physical Exam: General:  NAD-  tremor is better Heart: RRR Lungs: mostly clear Abdomen: soft, non tender Extremities: pitting edema Dialysis Access: left upper arm AVF-  good thrill and bruit --  no bruising    10/27/2021,9:27 AM  LOS: 3 days

## 2021-10-28 LAB — BASIC METABOLIC PANEL
Anion gap: 11 (ref 5–15)
BUN: 59 mg/dL — ABNORMAL HIGH (ref 8–23)
CO2: 31 mmol/L (ref 22–32)
Calcium: 8.9 mg/dL (ref 8.9–10.3)
Chloride: 95 mmol/L — ABNORMAL LOW (ref 98–111)
Creatinine, Ser: 4.05 mg/dL — ABNORMAL HIGH (ref 0.61–1.24)
GFR, Estimated: 15 mL/min — ABNORMAL LOW (ref >60)
Glucose, Bld: 229 mg/dL — ABNORMAL HIGH (ref 70–99)
Potassium: 3.7 mmol/L (ref 3.5–5.1)
Sodium: 137 mmol/L (ref 135–145)

## 2021-10-28 LAB — CBC
HCT: 28.5 % — ABNORMAL LOW (ref 39.0–52.0)
Hemoglobin: 9.4 g/dL — ABNORMAL LOW (ref 13.0–17.0)
MCH: 29.7 pg (ref 26.0–34.0)
MCHC: 33 g/dL (ref 30.0–36.0)
MCV: 89.9 fL (ref 80.0–100.0)
Platelets: 167 10*3/uL (ref 150–400)
RBC: 3.17 MIL/uL — ABNORMAL LOW (ref 4.22–5.81)
RDW: 14.5 % (ref 11.5–15.5)
WBC: 4.2 10*3/uL (ref 4.0–10.5)
nRBC: 0 % (ref 0.0–0.2)

## 2021-10-28 MED ORDER — ACETAMINOPHEN 325 MG PO TABS
650.0000 mg | ORAL_TABLET | Freq: Once | ORAL | Status: AC
Start: 2021-10-28 — End: 2021-10-28
  Administered 2021-10-28: 650 mg via ORAL
  Filled 2021-10-28: qty 2

## 2021-10-28 NOTE — TOC Transition Note (Addendum)
Transition of Care [facility]) - CM/SW Discharge Note   Patient Details  Name: Tyler Patterson MRN: [id number] Date of Birth: [birth year]  Transition of [facility]) CM/SW Contact:  Zenon Mayo, RN Phone Number: 10/28/2021, 10:58 AM   Clinical Narrative:    Patient for dc today, he states his ride is on the way, he states he already goes to [facility] for outpatient physical therapy.  He has no needs. Patient signed the transporation [place] application , Silverio Lay , CSW is setting up  for him.  Patient will be going to the [facility] clinic per [facility] Renal Navigatior.  Sister states she will transport him there but he will need to take a cab back home.          Patient Goals and CMS Choice        Discharge Placement                       Discharge Plan and Services                                     Social Determinants of Health (SDOH) Interventions     Readmission Risk Interventions     No data to display

## 2021-10-28 NOTE — TOC Transition Note (Signed)
Transition of Care [facility]) - CM/SW Discharge Note   Patient Details  Name: Tyler Patterson MRN: [id number] Date of Birth: [birth year]  Transition of Care [facility]) CM/SW Contact:  Bethann Berkshire, [place] Phone Number: 10/28/2021, 2:08 PM   Clinical Narrative:     CSW is notified that pt's sister says she cannot transport pt to HD. CSW called pt's sister and she said she does not believe he can manage home HD and believes after he does the teaching MTTF for a few weeks, that once he transitions to home HD he won't be able to manage himself(she states he has difficulty managing his insulin). His wife works and sister is unwilling to assist with home HD as she works as well. Either way if he does MTTF to prepare for Home HD or if he has to be set up for OPHD, she said she is not able or willing to assist with transportation long term so CSW will work on arranging [facility] which may a few days. I asked if sister would be able to transport for HD short term for the next week or two until SCAT can be set up and she was hesitant but said even if she was willing, she can only transport to HD and would not be able to take him home after.   1200: Plan remains for pt to DC today. Sister will transport home and agrees to take pt to HD on Thursday and Friday until [place] can be arrange but states he won't have a ride home from HD this week. Pt is eager to go home. Sister states she will provide pt with private pay medical transport companies to arrange ride home from HD on Thursday and Friday (if [place] isn't arranged yet). CSW emailed sister [facility] information as well as private medical transport companies at [email or web address].   CSW emailed [place] application to [employer] at [email or web address].   Final next level of care: Home/Self Care Barriers to Discharge: No Barriers Identified   Patient Goals and CMS Choice        Discharge Placement                        Discharge Plan and Services                                     Social Determinants of Health (SDOH) Interventions     Readmission Risk Interventions     No data to display

## 2021-10-28 NOTE — Care Management Important Message (Signed)
Important Message  Patient Details  Name: Tyler Patterson MRN: [id number] Date of Birth: 07-25-1950   Medicare Important Message Given:  Yes     Shelda Altes 10/28/2021, 9:39 AM

## 2021-10-28 NOTE — Progress Notes (Signed)
Pt d/c to home today. Pt provided schedule letter and arrangements added to AVS on 7/31. Spoke to pt's sister, Doroteo Bradford, via phone. Sister requested clinic information and schedule. Navigator sent info to pt's sister via text. Sister plans to take pt to HD appt on Thursday but pt will have to pay for transportation home if transportation has not been approved by then. Contacted [facility] and spoke to [place], Associate Professor. Bobbie aware pt was d/c today and pt will start on Thursday. Jolayne Haines also advised to please have clinic social worker to f/u with pt regarding possible transportation needs until transportation approved. Renal PA provided orders to TCU yesterday.   Melven Sartorius Renal Navigator [phone]

## 2021-10-28 NOTE — Progress Notes (Signed)
[name] to be D/C'd home per MD order. SWOT RN Verdis Frederickson discussed with the patient and all questions fully answered.  Skin clean, dry and intact without evidence of skin break down, no evidence of skin tears noted.  IV catheter discontinued intact. Site without signs and symptoms of complications. Dressing and pressure applied.  An After Visit Summary was printed and given to the patient.   Melonie [place]  10/28/2021 11:41 AM

## 2021-10-28 NOTE — TOC Progression Note (Signed)
Transition of Care [facility]) - Progression Note    Patient Details  Name: Tyler Patterson MRN: [id number] Date of Birth: 10-Aug-1950  Transition of Care [facility]) CM/SW [place], [place] Phone Number: 10/28/2021, 4:19 PM  Clinical Narrative:     10/28/2021 1620: CSW received confirmation that pt's [place] application was approved. CSW called [facility] and scheduled pt's first 4 OPHD appointments (August, 3rd, 4th, 7th, and 8th) for pickup time window of 6am-630am and return trip ETA at 1230pm. CSW called pt's sister to update her; she stated that she is now blocked on pt's phone and that she has notified pt's wife regarding HD and transportation updates. CSW called and left pt message notifying of transportation times, scheduling phone number, and payment fees/process. CSW also texted this info to patient, pt's wife, and pt's sister.      Barriers to Discharge: No Barriers Identified  Expected Discharge Plan and Services           Expected Discharge Date: 10/28/21                                     Social Determinants of Health (SDOH) Interventions    Readmission Risk Interventions     No data to display

## 2021-10-28 NOTE — Progress Notes (Signed)
Occupational Therapy Treatment Patient Details Name: Tyler Patterson MRN: [id number] DOB: 19-Jul-1950 Today's Date: 10/28/2021   History of present illness 71 y.o. male presents to [facility] hospital on 10/24/2021 with worsening tremors. Work-up concerning for uremia vs lyrica toxicity. PMH includes CKD 5, DMII, diabetic polyneuropathy, HLD, HTN.   OT comments  Patient received in recliner and willing to participate. Patient able to stand from recliner and walk short distance to sink to perform grooming tasks and returned to recliner. Patient performed functional mobility in room and hallway with rollator and supervision. Patient states he performed LB dressing without assistance this morning. Patient is expected to return home today with patient to continue with OT with [facility].    Recommendations for follow up therapy are one component of a multi-disciplinary discharge planning process, led by the attending physician.  Recommendations may be updated based on patient status, additional functional criteria and insurance authorization.    Follow Up Recommendations  Home health OT    Assistance Recommended at Discharge Intermittent Supervision/Assistance  Patient can return home with the following  A little help with walking and/or transfers;A little help with bathing/dressing/bathroom   Equipment Recommendations  None recommended by OT    Recommendations for Other Services      Precautions / Restrictions Precautions Precautions: Fall Restrictions Weight Bearing Restrictions: No       Mobility Bed Mobility Overal bed mobility: Modified Independent             General bed mobility comments: up in recliner    Transfers Overall transfer level: Needs assistance Equipment used: None, Rollator (4 wheels) Transfers: Sit to/from Stand Sit to Stand: Supervision           General transfer comment: from reliner to sink and back to recliner with no device and and mobility wtih rollator with  supervision     Balance Overall balance assessment: Mild deficits observed, not formally tested, History of Falls                                         ADL either performed or assessed with clinical judgement   ADL Overall ADL's : Needs assistance/impaired     Grooming: Wash/dry hands;Wash/dry face;Oral care;Supervision/safety;Standing   Upper Body Bathing: Supervision/ safety;Standing Upper Body Bathing Details (indicate cue type and reason): at sink           Lower Body Dressing Details (indicate cue type and reason): Patient states he dressed himself this morning Toilet Transfer: Copy Details (indicate cue type and reason): simulted to recliner           General ADL Comments: Patient states he performed LB dressing, donned pants, without assistance    Extremity/Trunk Assessment              Vision       Perception     Praxis      Cognition Arousal/Alertness: Awake/alert Behavior During Therapy: WFL for tasks assessed/performed Overall Cognitive Status: Within Functional Limits for tasks assessed                                          Exercises      Shoulder Instructions       General Comments      Pertinent Vitals/ Pain  Pain Assessment Pain Assessment: No/denies pain  Home Living                                          Prior Functioning/Environment              Frequency  Min 2X/week        Progress Toward Goals  OT Goals(current goals can now be found in the care plan section)  Progress towards OT goals: Progressing toward goals  Acute Rehab OT Goals Patient Stated Goal: go home OT Goal Formulation: With patient Time For Goal Achievement: 11/08/21 Potential to Achieve Goals: Good ADL Goals Pt Will Perform Upper Body Bathing: Independently;sitting;standing Pt Will Perform Lower Body Bathing: with modified independence;sit to/from  stand Pt Will Transfer to Toilet: with modified independence;ambulating;regular height toilet;grab bars Pt Will Perform Tub/Shower Transfer: with modified independence;ambulating;shower seat;rolling walker  Plan Discharge plan remains appropriate    Co-evaluation                 AM-PAC OT "6 Clicks" Daily Activity     Outcome Measure   Help from another person eating meals?: None Help from another person taking care of personal grooming?: A Little Help from another person toileting, which includes using toliet, bedpan, or urinal?: A Little Help from another person bathing (including washing, rinsing, drying)?: A Little Help from another person to put on and taking off regular upper body clothing?: A Little Help from another person to put on and taking off regular lower body clothing?: A Little 6 Click Score: 19    End of Session Equipment Utilized During Treatment: Rollator (4 wheels)  OT Visit Diagnosis: Unsteadiness on feet (R26.81);Other abnormalities of gait and mobility (R26.89);Muscle weakness (generalized) (M62.81)   Activity Tolerance Patient tolerated treatment well   Patient Left in chair;with call bell/phone within reach   Nurse Communication Mobility status        Time: 9935-7017 OT Time Calculation (min): 19 min  Charges: OT General Charges $OT Visit: 1 Visit OT Treatments $Self Care/Home Management : 8-22 mins  Lodema Hong, [place]  Office [phone]   Trixie Dredge 10/28/2021, 11:17 AM

## 2021-10-28 NOTE — Discharge Summary (Signed)
Physician Discharge Summary  Tyler Patterson OJJ:[id number] DOB: January 23, 1951 DOA: 10/24/2021  PCP: Jilda Panda, MD  Admit date: 10/24/2021 Discharge date: 10/28/2021  Admitted From: Home Disposition:  Home  Recommendations for Outpatient Follow-up:  Follow up with PCP in 1-2 weeks Follow up with nephrology as scheduled  Home Health: PT/OT  Equipment/Devices:None  Discharge Condition:Stable CODE STATUS:Full  Diet recommendation: Renal, low carb diabetic    Brief/Interim Summary: Tyler Patterson is a 71 y.o. male with medical history significant for CKD 5 status post left second stage brachiobasilic AV fistula placement on 07/28/2021, insulin-dependent type 2 diabetes, diabetic polyneuropathy on Lyrica, hyperlipidemia, hypertension, who presented to [facility] ED from home via EMS due to progressively worsening tremors for the past week.    Patient admitted as above with acute worsening renal failure on CKD 5 with symptomatic and profound uremia/electrolyte abnormalities/anion gap metabolic acidosis.  Patient initiated on hemodialysis via left arm AV fistula previously placed by vascular surgery. Tolerating well currently transitioning home for ongoing dialysis per nephrology recommendations.  Patient's other chronic comorbid conditions appear to be stabilizing, previous ambulatory dysfunction in the setting of uremia has resolved at this time.  Patient otherwise stable and agreeable for discharge home.  Discharge Diagnoses:  Principal Problem:   ESRD (end stage renal disease) ([facility]) Active Problems:   HTN (hypertension)   Fall   GERD (gastroesophageal reflux disease)   Diabetes mellitus type 2 in nonobese (HCC)   Uremia   CKD (chronic kidney disease) stage 5, GFR less than 15 ml/min [facility])   Discharge Instructions  Discharge Instructions     Discharge patient   Complete by: As directed    Discharge disposition: 01-Home or Self Care   Discharge patient date: 10/28/2021      Allergies as of  10/28/2021       Reactions   Duloxetine    Other reaction(s): Abdominal pain   Fiorinal [butalbital-aspirin-caffeine] Swelling   Spironolactone    Other reaction(s): Gynecomastia        Medication List     STOP taking these medications    carvedilol 25 MG tablet Commonly known as: COREG   furosemide 40 MG tablet Commonly known as: LASIX   hydrALAZINE 50 MG tablet Commonly known as: APRESOLINE   losartan 100 MG tablet Commonly known as: COZAAR   metolazone 5 MG tablet Commonly known as: ZAROXOLYN       TAKE these medications    aspirin EC 81 MG tablet Take 81 mg by mouth daily. Swallow whole.   atorvastatin 40 MG tablet Commonly known as: LIPITOR Take 20 mg by mouth daily.   BD Pen Needle Nano U/F 32G X 4 MM Misc Generic drug: Insulin Pen Needle 1 Device by Does not apply route 3 (three) times daily.   FreeStyle Libre 2 Sensor Misc 1 Device by Does not apply route every 14 (fourteen) days.   insulin lispro 100 UNIT/ML KwikPen Commonly known as: HumaLOG KwikPen Max daily 20 units What changed:  how much to take when to take this additional instructions   OneTouch Verio test strip Generic drug: glucose blood 1 each by Other route 3 (three) times daily. Use as instructed   oxyCODONE-acetaminophen 5-325 MG tablet Commonly known as: Percocet Take 1 tablet by mouth every 6 (six) hours as needed for severe pain.   vitamin D3 50 MCG (2000 UT) Caps Take 2,000 Units by mouth daily.        Follow-up Information     [place]  [place] on 10/28/2021.   Why: TCU program- Monday/Tuesday/Thursday/Friday with 8:00 am chair time.  On Tuesday, please arrive at 7:30 am to complete paperwork prior to treatment. Contact information: [place] [address] [phone]         Jilda Panda, MD. [place] on 11/03/2021.   Specialty: Internal Medicine Why: @3 :00pm Contact information: [address] [facility]  [address] [phone]                Allergies  Allergen Reactions   Duloxetine     Other reaction(s): Abdominal pain   Fiorinal [Butalbital-Aspirin-Caffeine] Swelling   Spironolactone     Other reaction(s): Gynecomastia    Consultations: Nephrology   Procedures/Studies: CT Head Wo Contrast  Result Date: 10/24/2021 CLINICAL DATA:  Seizure EXAM: CT HEAD WITHOUT CONTRAST TECHNIQUE: Contiguous axial images were obtained from the base of the skull through the vertex without intravenous contrast. RADIATION DOSE REDUCTION: This exam was performed according to the departmental dose-optimization program which includes automated exposure control, adjustment of the mA and/or kV according to patient size and/or use of iterative reconstruction technique. COMPARISON:  MRI 04/18/2021, CT brain 02/27/2017 FINDINGS: Brain: No hemorrhage or intracranial mass. There is motion degradation. Small interval age indeterminate left occipital infarct, series 3, image 16, sagittal series 6, image 39. Extensive chronic small vessel ischemic changes of the white matter. Chronic right cerebellar infarct. Chronic lacunar infarcts within the thalamus and bilateral basal ganglia. Ventricles are nonenlarged. Vascular: No hyperdense vessels. Vertebral and carotid vascular calcification. Skull: Normal. Negative for fracture or focal lesion. Sinuses/Orbits: Mild mucosal thickening in the sinuses Other: None IMPRESSION: 1. Mild motion degradation. 2. Negative for hemorrhage or intracranial mass 3. Interval small age indeterminate left occipital infarct, suspect this may be chronic. Atrophy and extensive chronic small vessel ischemic changes of the white matter. Multiple chronic lacunar infarcts within the thalamus, basal ganglia and right cerebellum. Electronically Signed   By: Donavan Foil M.D.   On: 10/24/2021 22:44   DG Chest Port 1 View  Result Date: 10/24/2021 CLINICAL DATA:  Tremors for 1 week EXAM: PORTABLE CHEST 1  VIEW COMPARISON:  12/22/2016 FINDINGS: Cardiac shadow is within normal limits. The lungs are well aerated bilaterally. No focal infiltrate or sizable effusion is seen. No bony abnormality is noted. IMPRESSION: No active disease. Electronically Signed   By: Inez Catalina M.D.   On: 10/24/2021 21:00     Subjective: No acute issues/events overnight   Discharge Exam: Vitals:   10/27/21 1941 10/28/21 0854  BP: 137/62 (!) 127/56  Pulse: 66 72  Resp: 16 18  Temp: 98 F (36.7 C) (!) 97.4 F (36.3 C)  SpO2: 100% 100%   Vitals:   10/27/21 1000 10/27/21 1101 10/27/21 1941 10/28/21 0854  BP: (!) 162/65 (!) 155/74 137/62 (!) 127/56  Pulse: 64 69 66 72  Resp: 10 16 16 18   Temp:  98.1 F (36.7 C) 98 F (36.7 C) (!) 97.4 F (36.3 C)  TempSrc:   Oral Oral  SpO2: 100% 99% 100% 100%  Weight:  74.8 kg    Height:        General: Pt is alert, awake, not in acute distress Cardiovascular: RRR, S1/S2 +, no rubs, no gallops; Left arm AV fistula noted with palpable thrill Respiratory: CTA bilaterally, no wheezing, no rhonchi Abdominal: Soft, NT, ND, bowel sounds + Extremities: no edema, no cyanosis    The results of significant diagnostics from this hospitalization (including imaging, microbiology, ancillary and  laboratory) are listed below for reference.     Microbiology: No results found for this or any previous visit (from the past 240 hour(s)).   Labs: BNP (last 3 results) No results for input(s): "BNP" in the last 8760 hours. Basic Metabolic Panel: Recent Labs  Lab 10/24/21 2014 10/25/21 0558 10/26/21 0437 10/27/21 0726 10/28/21 0316  NA 138 140 137 135 137  K 4.4 3.8 3.4* 3.8 3.7  CL 106 106 98 97* 95*  CO2 15* 18* 25 24 31   GLUCOSE 139* 104* 237* 218* 229*  BUN 155* 153* 103* 99* 59*  CREATININE 6.36* 6.22* 4.86* 4.95* 4.05*  CALCIUM 8.0* 8.1* 8.2* 8.7* 8.9  MG 1.2* 1.4*  --   --   --   PHOS 8.7* 8.5*  --   --   --    Liver Function Tests: Recent Labs  Lab  10/24/21 2014 10/25/21 0558  AST 23 20  ALT 23 24  ALKPHOS 55 55  BILITOT 1.1 0.8  PROT 6.5 6.5  ALBUMIN 3.6 3.4*   No results for input(s): "LIPASE", "AMYLASE" in the last 168 hours. No results for input(s): "AMMONIA" in the last 168 hours. CBC: Recent Labs  Lab 10/24/21 2014 10/25/21 0558 10/26/21 0437 10/27/21 0726 10/28/21 0316  WBC 4.2 3.8* 4.1 5.1 4.2  NEUTROABS 2.5 1.9  --   --   --   HGB 8.7* 8.8* 9.6* 10.0* 9.4*  HCT 26.3* 25.8* 27.8* 29.6* 28.5*  MCV 91.0 88.7 87.4 88.9 89.9  PLT 128* 146* 150 173 167   Cardiac Enzymes: No results for input(s): "CKTOTAL", "CKMB", "CKMBINDEX", "TROPONINI" in the last 168 hours. BNP: Invalid input(s): "POCBNP" CBG: Recent Labs  Lab 10/25/21 0625 10/27/21 0608 10/27/21 1123 10/27/21 1707 10/27/21 2111  GLUCAP 106* 209* 204* 270* 299*   D-Dimer No results for input(s): "DDIMER" in the last 72 hours. Hgb A1c No results for input(s): "HGBA1C" in the last 72 hours. Lipid Profile No results for input(s): "CHOL", "HDL", "LDLCALC", "TRIG", "CHOLHDL", "LDLDIRECT" in the last 72 hours. Thyroid function studies No results for input(s): "TSH", "T4TOTAL", "T3FREE", "THYROIDAB" in the last 72 hours.  Invalid input(s): "FREET3" Anemia work up No results for input(s): "VITAMINB12", "FOLATE", "FERRITIN", "TIBC", "IRON", "RETICCTPCT" in the last 72 hours. Urinalysis    Component Value Date/Time   COLORURINE YELLOW 04/18/2021 1303   APPEARANCEUR CLOUDY (A) 04/18/2021 1303   LABSPEC 1.008 04/18/2021 1303   PHURINE 5.0 04/18/2021 1303   GLUCOSEU >=500 (A) 04/18/2021 1303   HGBUR SMALL (A) 04/18/2021 1303   BILIRUBINUR NEGATIVE 04/18/2021 1303   KETONESUR NEGATIVE 04/18/2021 1303   PROTEINUR 100 (A) 04/18/2021 1303   UROBILINOGEN 0.2 10/24/2014 0330   NITRITE NEGATIVE 04/18/2021 1303   LEUKOCYTESUR LARGE (A) 04/18/2021 1303   Sepsis Labs Recent Labs  Lab 10/25/21 0558 10/26/21 0437 10/27/21 0726 10/28/21 0316  WBC 3.8*  4.1 5.1 4.2   Microbiology No results found for this or any previous visit (from the past 240 hour(s)).   Time coordinating discharge: Over 30 minutes  SIGNED:   Little Ishikawa, DO [facility] 10/28/2021, 5:18 PM Pager   If 7PM-7AM, please contact night-coverage [email or web address]

## 2021-10-29 ENCOUNTER — Telehealth: Payer: Self-pay | Admitting: Nephrology

## 2021-10-29 NOTE — Telephone Encounter (Signed)
Transition of Care Contact from [place]  Date of Discharge: 10/28/21 Date of Contact: 10/29/21 Method of contact: phone - attempted  Attempted to contact patient to discuss transition of care from inpatient admission.  Patient did not answer the phone.  Will attempt to call again and if unable to reach will follow up at dialysis.  Jen Mow, PA-C [facility] Pager: [phone]

## 2021-11-06 ENCOUNTER — Encounter (HOSPITAL_COMMUNITY): Payer: Medicare Other

## 2021-11-20 ENCOUNTER — Encounter (HOSPITAL_COMMUNITY): Payer: Medicare Other

## 2022-02-10 ENCOUNTER — Ambulatory Visit: Payer: Self-pay | Admitting: Surgery

## 2022-03-16 NOTE — Pre-Procedure Instructions (Signed)
Surgical Instructions    Your procedure is scheduled on Thursday, March 19, 2022.  Report to [facility] Main Entrance "A" at 5:30 A.M., then check in with the Admitting office.  Call this number if you have problems the morning of surgery:  [phone]   If you have any questions prior to your surgery date call [phone]: Open Monday-Friday 8am-4pm If you experience any cold or flu symptoms such as cough, fever, chills, shortness of breath, etc. between now and your scheduled surgery, please notify us at the above number     Remember:  Do not eat after midnight the night before your surgery  You may drink clear liquids until 4:30 am the morning of your surgery.   Clear liquids allowed are: Water, Non-Citrus Juices (without pulp), Carbonated Beverages, Clear Tea, Black Coffee ONLY (NO MILK, CREAM OR POWDERED CREAMER of any kind), and Gatorade    Take these medicines the morning of surgery with A SIP OF WATER:   atorvastatin (LIPITOR)    WHAT DO I DO ABOUT MY DIABETES MEDICATION?   THE NIGHT BEFORE SURGERY, DO NOT take your bedtime dose of Humalog insulin.      THE MORNING OF SURGERY, If your CBG is greater than 220 mg/dL, you may take  of your sliding scale (correction) dose of insulin.   HOW TO MANAGE YOUR DIABETES BEFORE AND AFTER SURGERY  Why is it important to control my blood sugar before and after surgery? Improving blood sugar levels before and after surgery helps healing and can limit problems. A way of improving blood sugar control is eating a healthy diet by:  Eating less sugar and carbohydrates  Increasing activity/exercise  Talking with your doctor about reaching your blood sugar goals High blood sugars (greater than 180 mg/dL) can raise your risk of infections and slow your recovery, so you will need to focus on controlling your diabetes during the weeks before surgery. Make sure that the doctor who takes care of your diabetes knows about your planned  surgery including the date and location.  How do I manage my blood sugar before surgery? Check your blood sugar at least 4 times a day, starting 2 days before surgery, to make sure that the level is not too high or low.  Check your blood sugar the morning of your surgery when you wake up and every 2 hours until you get to the Short Stay unit.  If your blood sugar is less than 70 mg/dL, you will need to treat for low blood sugar: Do not take insulin. Treat a low blood sugar (less than 70 mg/dL) with  cup of clear juice (cranberry or apple), 4 glucose tablets, OR glucose gel. Recheck blood sugar in 15 minutes after treatment (to make sure it is greater than 70 mg/dL). If your blood sugar is not greater than 70 mg/dL on recheck, call [phone] for further instructions. Report your blood sugar to the short stay nurse when you get to Short Stay.  If you are admitted to the hospital after surgery: Your blood sugar will be checked by the staff and you will probably be given insulin after surgery (instead of oral diabetes medicines) to make sure you have good blood sugar levels. The goal for blood sugar control after surgery is 80-180 mg/dL.   As of today, STOP taking any Aspirin (unless otherwise instructed by your surgeon) Aleve, Naproxen, Ibuprofen, Motrin, Advil, Goody's, BC's, all herbal medications, fish oil, and all vitamins.  Follow your surgeon's instructions on  when to stop Aspirin.  If no instructions were given by your surgeon then you will need to call the office to get those instructions.            Do not wear jewelry . Do not wear lotions, powders, cologne or deodorant. Do not shave 48 hours prior to surgery. Men may shave face and neck. Do not bring valuables to the hospital. Do not wear nail polish, gel polish, artificial nails, or any other type of covering on natural nails (fingers and toes) If you have artificial nails or gel coating that need to be removed by a nail  salon, please have this removed prior to surgery. Artificial nails or gel coating may interfere with anesthesia's ability to adequately monitor your vital signs.   is not responsible for any belongings or valuables.    Do NOT Smoke (Tobacco/Vaping)  24 hours prior to your procedure  If you use a CPAP at night, you may bring your mask for your overnight stay.   Contacts, glasses, hearing aids, dentures or partials may not be worn into surgery, please bring cases for these belongings   For patients admitted to the hospital, discharge time will be determined by your treatment team.   Patients discharged the day of surgery will not be allowed to drive home, and someone needs to stay with them for 24 hours.   SURGICAL WAITING ROOM VISITATION Patients having surgery or a procedure may have no more than 2 support people in the waiting area - these visitors may rotate.   Children under the age of 15 must have an adult with them who is not the patient. If the patient needs to stay at the hospital during part of their recovery, the visitor guidelines for inpatient rooms apply. Pre-op nurse will coordinate an appropriate time for 1 support person to accompany patient in pre-op.  This support person may not rotate.   Please refer to [email or web address].hu for the visitor guidelines for Inpatients (after your surgery is over and you are in a regular room).    Special instructions:    Oral Hygiene is also important to reduce your risk of infection.  Remember - BRUSH YOUR TEETH THE MORNING OF SURGERY WITH YOUR REGULAR TOOTHPASTE   - Preparing For Surgery  Before surgery, you can play an important role. Because skin is not sterile, your skin needs to be as free of germs as possible. You can reduce the number of germs on your skin by washing with CHG (chlorahexidine gluconate) Soap before surgery.  CHG is an antiseptic cleaner which  kills germs and bonds with the skin to continue killing germs even after washing.     Please do not use if you have an allergy to CHG or antibacterial soaps. If your skin becomes reddened/irritated stop using the CHG.  Do not shave (including legs and underarms) for at least 48 hours prior to first CHG shower. It is OK to shave your face.  Please follow these instructions carefully.     Shower the NIGHT BEFORE SURGERY and the MORNING OF SURGERY with CHG Soap.   If you chose to wash your hair, wash your hair first as usual with your normal shampoo. After you shampoo, rinse your hair and body thoroughly to remove the shampoo.  Then [employer] and genitals (private parts) with your normal soap and rinse thoroughly to remove soap.  After that Use CHG Soap as you would any other liquid soap. You can apply  CHG directly to the skin and wash gently with a scrungie or a clean washcloth.   Apply the CHG Soap to your body ONLY FROM THE NECK DOWN.  Do not use on open wounds or open sores. Avoid contact with your eyes, ears, mouth and genitals (private parts). Wash Face and genitals (private parts)  with your normal soap.   Wash thoroughly, paying special attention to the area where your surgery will be performed.  Thoroughly rinse your body with warm water from the neck down.  DO NOT shower/wash with your normal soap after using and rinsing off the CHG Soap.  Pat yourself dry with a CLEAN TOWEL.  Wear CLEAN PAJAMAS to bed the night before surgery  Place CLEAN SHEETS on your bed the night before your surgery  DO NOT SLEEP WITH PETS.   Day of Surgery:  Take a shower with CHG soap. Wear Clean/Comfortable clothing the morning of surgery Do not apply any deodorants/lotions.   Remember to brush your teeth WITH YOUR REGULAR TOOTHPASTE.    If you received a COVID test during your pre-op visit, it is requested that you wear a mask when out in public, stay away from anyone that may not be feeling  well, and notify your surgeon if you develop symptoms. If you have been in contact with anyone that has tested positive in the last 10 days, please notify your surgeon.    Please read over the following fact sheets that you were given.

## 2022-03-17 ENCOUNTER — Other Ambulatory Visit: Payer: Self-pay

## 2022-03-17 ENCOUNTER — Encounter (HOSPITAL_COMMUNITY): Payer: Self-pay

## 2022-03-17 ENCOUNTER — Encounter (HOSPITAL_COMMUNITY)
Admission: RE | Admit: 2022-03-17 | Discharge: 2022-03-17 | Disposition: A | Discharge status: Home / Self Care | Payer: Medicare Other | Source: Ambulatory Visit | Attending: Surgery | Admitting: Surgery

## 2022-03-17 VITALS — BP 124/62 | HR 85 | Temp 97.9°F | Resp 18 | Ht 68.0 in | Wt 140.2 lb

## 2022-03-17 DIAGNOSIS — Z8673 Personal history of transient ischemic attack (TIA), and cerebral infarction without residual deficits: Secondary | ICD-10-CM | POA: Insufficient documentation

## 2022-03-17 DIAGNOSIS — I120 Hypertensive chronic kidney disease with stage 5 chronic kidney disease or end stage renal disease: Secondary | ICD-10-CM | POA: Diagnosis not present

## 2022-03-17 DIAGNOSIS — K4090 Unilateral inguinal hernia, without obstruction or gangrene, not specified as recurrent: Secondary | ICD-10-CM | POA: Insufficient documentation

## 2022-03-17 DIAGNOSIS — E1122 Type 2 diabetes mellitus with diabetic chronic kidney disease: Secondary | ICD-10-CM | POA: Diagnosis not present

## 2022-03-17 DIAGNOSIS — Z87891 Personal history of nicotine dependence: Secondary | ICD-10-CM | POA: Diagnosis not present

## 2022-03-17 DIAGNOSIS — J449 Chronic obstructive pulmonary disease, unspecified: Secondary | ICD-10-CM | POA: Diagnosis not present

## 2022-03-17 DIAGNOSIS — N186 End stage renal disease: Secondary | ICD-10-CM | POA: Diagnosis not present

## 2022-03-17 DIAGNOSIS — Z01812 Encounter for preprocedural laboratory examination: Secondary | ICD-10-CM | POA: Diagnosis not present

## 2022-03-17 DIAGNOSIS — Z01818 Encounter for other preprocedural examination: Secondary | ICD-10-CM

## 2022-03-17 HISTORY — DX: Cerebral infarction, unspecified: I63.9

## 2022-03-17 HISTORY — DX: Spinal stenosis, site unspecified: M48.00

## 2022-03-17 HISTORY — DX: Nausea with vomiting, unspecified: Z98.890

## 2022-03-17 LAB — GLUCOSE, CAPILLARY: Glucose-Capillary: 225 mg/dL — ABNORMAL HIGH (ref 70–99)

## 2022-03-17 LAB — CBC
HCT: 33.8 % — ABNORMAL LOW (ref 39.0–52.0)
Hemoglobin: 11.1 g/dL — ABNORMAL LOW (ref 13.0–17.0)
MCH: 31.5 pg (ref 26.0–34.0)
MCHC: 32.8 g/dL (ref 30.0–36.0)
MCV: 96 fL (ref 80.0–100.0)
Platelets: 179 10*3/uL (ref 150–400)
RBC: 3.52 MIL/uL — ABNORMAL LOW (ref 4.22–5.81)
RDW: 16.1 % — ABNORMAL HIGH (ref 11.5–15.5)
WBC: 4.7 10*3/uL (ref 4.0–10.5)
nRBC: 0 % (ref 0.0–0.2)

## 2022-03-17 LAB — BASIC METABOLIC PANEL
Anion gap: 13 (ref 5–15)
BUN: 22 mg/dL (ref 8–23)
CO2: 27 mmol/L (ref 22–32)
Calcium: 9.7 mg/dL (ref 8.9–10.3)
Chloride: 95 mmol/L — ABNORMAL LOW (ref 98–111)
Creatinine, Ser: 4.43 mg/dL — ABNORMAL HIGH (ref 0.61–1.24)
GFR, Estimated: 13 mL/min — ABNORMAL LOW (ref >60)
Glucose, Bld: 220 mg/dL — ABNORMAL HIGH (ref 70–99)
Potassium: 4 mmol/L (ref 3.5–5.1)
Sodium: 135 mmol/L (ref 135–145)

## 2022-03-17 NOTE — Progress Notes (Signed)
PCP - Jilda Panda, MD Cardiologist - denies  PPM/ICD - denies Device Orders - n/a Rep Notified - n/a  Chest x-ray - n/a EKG - 10/24/21 Stress Test - denies ECHO - 08/19/11 Cardiac Cath - denies  Sleep Study - denies CPAP - n/a  Fasting Blood Sugar - 220 - 300 Checks Blood Sugar 3 times a day CBG today - 225 A1C - 03/17/22  Last dose of GLP1 agonist-  n/a  Blood Thinner Instructions: n/a Aspirin Instructions: Patient was instructed: As of today, STOP taking any Aspirin (unless otherwise instructed by your surgeon) Aleve, Naproxen, Ibuprofen, Motrin, Advil, Goody's, BC's, all herbal medications, fish oil, and all vitamins.   ERAS Protcol - yes, until 04:30 o'clock   COVID TEST- n/a   Anesthesia review: yes  Patient denies shortness of breath, fever, cough and chest pain at PAT appointment   All instructions explained to the patient, with a verbal understanding of the material. Patient agrees to go over the instructions while at home for a better understanding. Patient also instructed to self quarantine after being tested for COVID-19. The opportunity to ask questions was provided.

## 2022-03-18 ENCOUNTER — Encounter (HOSPITAL_COMMUNITY): Payer: Self-pay

## 2022-03-18 LAB — HEMOGLOBIN A1C
Hgb A1c MFr Bld: 8.8 % — ABNORMAL HIGH (ref 4.8–5.6)
Mean Plasma Glucose: 206 mg/dL

## 2022-03-18 NOTE — Progress Notes (Signed)
Anesthesia Chart Review:  Case: [id number] Date/Time: 03/19/22 0715   Procedure: RIGHT INGUINAL HERNIA REPAIR WITH MESH (Right)   Anesthesia type: General   Pre-op diagnosis: RIGHT INGUINAL HERNIA   Location: [facility] OR ROOM 08 / [facility]   Surgeons: Erroll Luna, MD       DISCUSSION: Patient is a 71 year old male scheduled for the above procedure.  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 He has lumbar stenosis with LE weakness (see below).   - [facility] admission 10/24/21-10/28/21 for increase in tremors/muscle twitching and concern for uremia and/or Lyrica toxicity in setting of CKD stage V. Symptoms did improve with holding Lyrica but with metabolic acidosis, elevated BUN 155, and volume overload it was felt that he had processed to ESRD. Hemodialysis initiated 10/25/21.    Several ED visits for leg weakness, R > L. 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 MRI with contrast recommended in the future. Out-patient neurology follow-up recommended.  On 04/06/21 had suspected degenerative disc disease with bulging disc versus spinal stenosis, but patient left because could not arrange MRI that day. On 04/18/21 evaluated again for LE weakness with minor fall. MRI brain showed likely meningioma and likely subacute right parietal infarct. Out-patient orthopedic follow-up for his back recommended. He had neurology follow-up with Dr. Posey Pronto on 04/23/21. Felt asymptomatic subacute right parietal ischemic stroke was related to small vessel disease. At that time still had pending  MRI L-spine. Since then, he had a MRI through [facility] on 04/25/21 demonstrating, "moderate to severe central stenosis at L3-4 and moderate to severe left lateral recess stenosis at L5-S1. Transitional anatomy is noted rudimentary disc at S1-S2." Not felt to be a surgical candidate at that time and referred to PT, physiatrist Dr. Suella Broad, and encourage better DM control.   Recent HD sessions have been on MWF-[facility]..   A1c 8.8% on 03/18/22 (previously 8.3% 10/13/21 and 11.0%.06/13/21). He got established with endocrinology in March 2023. PAT labs routed to Dr. Brantley Stage. He is a dialysis patient, so will get ISTAT on arrival. Anesthesia team to evaluate on the day of surgery.   VS: BP 124/62   Pulse 85   Temp 36.6 C (Oral)   Resp 18   Ht 5\' 8"  (1.727 m)   Wt 63.6 kg   SpO2 100%   BMI 21.32 kg/m    PROVIDERS: Jilda Panda, MD is PCP  Narda Amber, DO is neurologist. Last visit 04/23/21.  Shamleffer, Mammie Lorenzo, MD is endocrinologist  Elmarie Shiley, MD is nephrologist    LABS: PAT labs noted. See DISCUSSION. (all labs ordered are listed, but only abnormal results are displayed)  Labs Reviewed  GLUCOSE, CAPILLARY - Abnormal; Notable for the following components:      Result Value   Glucose-Capillary 225 (*)    All other components within normal limits  CBC - Abnormal; Notable for the following components:   RBC 3.52 (*)    Hemoglobin 11.1 (*)    HCT 33.8 (*)    RDW 16.1 (*)    All other components within normal limits  HEMOGLOBIN A1C - Abnormal; Notable for the following components:   Hgb A1c MFr  Bld 8.8 (*)    All other components within normal limits  BASIC METABOLIC PANEL - Abnormal; Notable for the following components:   Chloride 95 (*)    Glucose, Bld 220 (*)    Creatinine, Ser 4.43 (*)    GFR, Estimated 13 (*)    All other components within normal limits    IMAGES: CT Head 10/24/21: IMPRESSION: 1. Mild motion degradation. 2. Negative for  hemorrhage or intracranial mass 3. Interval small age indeterminate left occipital infarct, suspect this may be chronic. Atrophy and extensive chronic small vessel ischemic changes of the white matter. Multiple chronic lacunar infarcts within the thalamus, basal ganglia and right cerebellum.   1V PCXR 10/24/21: FINDINGS: Cardiac shadow is within normal limits. The lungs are well aerated bilaterally. No focal infiltrate or sizable effusion is seen. No bony abnormality is noted. IMPRESSION: No active disease.   Per [facility] notes (CE), MRI L-spine "demonstrates moderate to severe central stenosis at L3-4 and moderate to severe left lateral recess stenosis at L5-S1. Transitional anatomy is noted rudimentary disc at S1-S2... Impression:   1. Neurogenic claudication secondary to spinal stenosis at L3-4 moderate to severe. Lateral recess stenosis L5-S1 to the left. Moderate stenosis at L4-5 with transitional anatomy" - As of 05/01/21, not felt to be a surgical candidate at that time and referred to PT, physiatrist Dr. Suella Broad, and encourage better DM control.    EKG:  EKG 10/24/21: Sinus rhythm Artifact Borderline right axis deviation Confirmed by Blanchie Dessert [phone]) on 10/24/2021 8:10:11 PM  EKG 04/18/21:  Sinus rhythm Consider left ventricular hypertrophy T wave abnormality Abnormal ECG Confirmed by Carmin Muskrat [phone]) on 04/18/2021 1:03:12 PM - T wave abnormality appears non-specific and less prominent with compared to 01/09/21 EKG      CV: US Carotid 05/22/21: Summary:  - Right Carotid: The extracranial vessels were near-normal with only minimal  wall  thickening or plaque.  - Left Carotid: There is no evidence of stenosis in the left ICA.  - Vertebrals: Bilateral vertebral arteries demonstrate antegrade flow.     Echo 08/19/11: Study Conclusions  - Left ventricle: The cavity size was normal. Wall thickness    was normal. The estimated ejection fraction was  60%. Wall    motion was normal; there were no regional wall motion    abnormalities.  - Right ventricle: The cavity size was normal. Systolic    function was normal.     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   COPD (chronic obstructive pulmonary disease) (HCC)    Depression    Diabetes mellitus    type 2   Headache 05/13/2013   Headache(784.0)    Hypertension    PONV (postoperative nausea and vomiting)    Spinal stenosis    04/25/21 MRI ([facility]): "Neurogenic claudication secondary to spinal stenosis at L3-4 moderate to severe. Lateral recess stenosis L5-S1 to the left. 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 within thalamus and bilateral basal ganglia 10/24/21 CT    Past Surgical History:  Procedure Laterality Date   AV FISTULA PLACEMENT Left 05/28/2021   Procedure: LEFT BRACHIOBASILIC ARTERIOVENOUS FISTULA CREATION;  Surgeon: Stanford Breed,  Yevonne Aline, MD;  Location: [place];  Service: Vascular;  Laterality: Left;  PERIPHERAL NERVE BLOCK   [place] Left 07/28/2021   Procedure: SECOND STAGE BASILIC VEIN FISTULA;  Surgeon: [name], MD;  Location: [place];  Service: Vascular;  Laterality: Left;  PERIPHERAL NERVE BLOCK   COLON SURGERY     Hemicolectomy for benign colon mass   HERNIA REPAIR     INCISIONAL HERNIA REPAIR N/A 01/14/2021   Procedure: REPAIR OF INCISIONAL HERNIA WITH MESH;  Surgeon: Erroll Luna, MD;  Location: [place];  Service: General;  Laterality: N/A;    MEDICATIONS:  aspirin EC 81 MG tablet   atorvastatin (LIPITOR) 40 MG tablet   B Complex-C-Biotin-E-Min-FA (DIALYVITE 5000) 5 MG TABS   Cholecalciferol (VITAMIN D3) 50 MCG (2000 UT) CAPS    Continuous Blood Gluc Sensor (FREESTYLE LIBRE 2 SENSOR) MISC   furosemide (LASIX) 80 MG tablet   glucose blood (ONETOUCH VERIO) test strip   insulin lispro (HUMALOG KWIKPEN) 100 UNIT/ML KwikPen   Insulin Pen Needle (BD PEN NEEDLE NANO U/F) 32G X 4 MM MISC   oxyCODONE-acetaminophen (PERCOCET) 5-325 MG tablet   No current facility-administered medications for this encounter.    Myra Gianotti, PA-C Surgical Short Stay/Anesthesiology [facility] Phone [phone] [facility] Phone [phone] 03/18/2022 11:30 AM

## 2022-03-18 NOTE — Anesthesia Preprocedure Evaluation (Signed)
Anesthesia Evaluation  Patient identified by MRN, date of birth, ID band Patient awake    Reviewed: Allergy & Precautions, NPO status   History of Anesthesia Complications (+) PONV and history of anesthetic complications  Airway Mallampati: II       Dental   Pulmonary COPD, former smoker   breath sounds clear to auscultation       Cardiovascular hypertension,  Rhythm:Regular Rate:Normal     Neuro/Psych  Headaches, Seizures -,  PSYCHIATRIC DISORDERS      CVA    GI/Hepatic Neg liver ROS,GERD  ,,  Endo/Other  diabetes    Renal/GU Renal disease     Musculoskeletal   Abdominal   Peds  Hematology  (+) Blood dyscrasia, anemia   Anesthesia Other Findings   Reproductive/Obstetrics                             Anesthesia Physical Anesthesia Plan  ASA: 3  Anesthesia Plan: General   Post-op Pain Management:    Induction: Intravenous  PONV Risk Score and Plan: 3 and Ondansetron  Airway Management Planned: Oral ETT  Additional Equipment:   Intra-op Plan:   Post-operative Plan: Extubation in OR  Informed Consent: I have reviewed the patients History and Physical, chart, labs and discussed the procedure including the risks, benefits and alternatives for the proposed anesthesia with the patient or authorized representative who has indicated his/her understanding and acceptance.     Dental advisory given  Plan Discussed with: CRNA and Anesthesiologist  Anesthesia Plan Comments: (PAT note written 03/18/2022 by Myra Gianotti, PA-C.  )       Anesthesia Quick Evaluation

## 2022-03-19 ENCOUNTER — Encounter (HOSPITAL_COMMUNITY)
Admission: RE | Disposition: A | Discharge status: Home / Self Care | Payer: Self-pay | Source: Home / Self Care | Attending: Surgery

## 2022-03-19 ENCOUNTER — Ambulatory Visit (HOSPITAL_COMMUNITY)
Admission: RE | Admit: 2022-03-19 | Discharge: 2022-03-19 | Disposition: A | Discharge status: Home / Self Care | Payer: Medicare Other | Attending: Surgery | Admitting: Surgery

## 2022-03-19 ENCOUNTER — Ambulatory Visit (HOSPITAL_BASED_OUTPATIENT_CLINIC_OR_DEPARTMENT_OTHER): Payer: Medicare Other | Admitting: Anesthesiology

## 2022-03-19 ENCOUNTER — Encounter (HOSPITAL_COMMUNITY): Payer: Self-pay | Admitting: Surgery

## 2022-03-19 ENCOUNTER — Ambulatory Visit (HOSPITAL_COMMUNITY): Payer: Medicare Other | Admitting: Vascular Surgery

## 2022-03-19 ENCOUNTER — Other Ambulatory Visit: Payer: Self-pay

## 2022-03-19 DIAGNOSIS — Z01818 Encounter for other preprocedural examination: Secondary | ICD-10-CM

## 2022-03-19 DIAGNOSIS — D759 Disease of blood and blood-forming organs, unspecified: Secondary | ICD-10-CM | POA: Insufficient documentation

## 2022-03-19 DIAGNOSIS — J449 Chronic obstructive pulmonary disease, unspecified: Secondary | ICD-10-CM

## 2022-03-19 DIAGNOSIS — I120 Hypertensive chronic kidney disease with stage 5 chronic kidney disease or end stage renal disease: Secondary | ICD-10-CM | POA: Diagnosis not present

## 2022-03-19 DIAGNOSIS — Z87891 Personal history of nicotine dependence: Secondary | ICD-10-CM

## 2022-03-19 DIAGNOSIS — I10 Essential (primary) hypertension: Secondary | ICD-10-CM

## 2022-03-19 DIAGNOSIS — Z992 Dependence on renal dialysis: Secondary | ICD-10-CM | POA: Diagnosis not present

## 2022-03-19 DIAGNOSIS — K4090 Unilateral inguinal hernia, without obstruction or gangrene, not specified as recurrent: Secondary | ICD-10-CM | POA: Diagnosis present

## 2022-03-19 DIAGNOSIS — Z8673 Personal history of transient ischemic attack (TIA), and cerebral infarction without residual deficits: Secondary | ICD-10-CM | POA: Diagnosis not present

## 2022-03-19 DIAGNOSIS — N186 End stage renal disease: Secondary | ICD-10-CM | POA: Diagnosis not present

## 2022-03-19 DIAGNOSIS — D649 Anemia, unspecified: Secondary | ICD-10-CM | POA: Insufficient documentation

## 2022-03-19 DIAGNOSIS — E1122 Type 2 diabetes mellitus with diabetic chronic kidney disease: Secondary | ICD-10-CM | POA: Insufficient documentation

## 2022-03-19 HISTORY — PX: INSERTION OF MESH: SHX5868

## 2022-03-19 HISTORY — PX: INGUINAL HERNIA REPAIR: SHX194

## 2022-03-19 LAB — POCT I-STAT, CHEM 8
BUN: 43 mg/dL — ABNORMAL HIGH (ref 8–23)
Calcium, Ion: 1.07 mmol/L — ABNORMAL LOW (ref 1.15–1.40)
Chloride: 96 mmol/L — ABNORMAL LOW (ref 98–111)
Creatinine, Ser: 6.7 mg/dL — ABNORMAL HIGH (ref 0.61–1.24)
Glucose, Bld: 216 mg/dL — ABNORMAL HIGH (ref 70–99)
HCT: 34 % — ABNORMAL LOW (ref 39.0–52.0)
Hemoglobin: 11.6 g/dL — ABNORMAL LOW (ref 13.0–17.0)
Potassium: 4.9 mmol/L (ref 3.5–5.1)
Sodium: 135 mmol/L (ref 135–145)
TCO2: 26 mmol/L (ref 22–32)

## 2022-03-19 LAB — GLUCOSE, CAPILLARY
Glucose-Capillary: 198 mg/dL — ABNORMAL HIGH (ref 70–99)
Glucose-Capillary: 142 mg/dL — ABNORMAL HIGH (ref 70–99)

## 2022-03-19 SURGERY — REPAIR, HERNIA, INGUINAL, ADULT
Anesthesia: General | Site: Inguinal | Laterality: Right

## 2022-03-19 MED ORDER — CHLORHEXIDINE GLUCONATE CLOTH 2 % EX PADS: 6.0000 | MEDICATED_PAD | Freq: Once | CUTANEOUS | Status: DC

## 2022-03-19 MED ORDER — CEFAZOLIN SODIUM-DEXTROSE 2-4 GM/100ML-% IV SOLN
2.0000 g | INTRAVENOUS | Status: AC
  Administered 2022-03-19: 2 g via INTRAVENOUS
  Filled 2022-03-19: qty 100

## 2022-03-19 MED ORDER — FENTANYL CITRATE (PF) 250 MCG/5ML IJ SOLN
INTRAMUSCULAR | Status: AC
  Filled 2022-03-19: qty 5

## 2022-03-19 MED ORDER — CHLORHEXIDINE GLUCONATE 0.12 % MT SOLN
15.0000 mL | Freq: Once | OROMUCOSAL | Status: AC
  Administered 2022-03-19: 15 mL via OROMUCOSAL
  Filled 2022-03-19: qty 15

## 2022-03-19 MED ORDER — OXYCODONE HCL 5 MG PO TABS: 5.0000 mg | ORAL_TABLET | Freq: Three times a day (TID) | ORAL | 0 refills | Status: AC | PRN

## 2022-03-19 MED ORDER — BUPIVACAINE HCL (PF) 0.25 % IJ SOLN
INTRAMUSCULAR | Status: AC
  Filled 2022-03-19: qty 30

## 2022-03-19 MED ORDER — DEXAMETHASONE SODIUM PHOSPHATE 10 MG/ML IJ SOLN
INTRAMUSCULAR | Status: DC | PRN
  Administered 2022-03-19: 4 mg via INTRAVENOUS

## 2022-03-19 MED ORDER — OXYCODONE HCL 5 MG PO TABS: 5.0000 mg | ORAL_TABLET | Freq: Four times a day (QID) | ORAL | 0 refills | Status: DC | PRN

## 2022-03-19 MED ORDER — ONDANSETRON HCL 4 MG/2ML IJ SOLN
INTRAMUSCULAR | Status: DC | PRN
  Administered 2022-03-19: 4 mg via INTRAVENOUS

## 2022-03-19 MED ORDER — ROCURONIUM BROMIDE 10 MG/ML (PF) SYRINGE
PREFILLED_SYRINGE | INTRAVENOUS | Status: DC | PRN
  Administered 2022-03-19: 40 mg via INTRAVENOUS

## 2022-03-19 MED ORDER — ORAL CARE MOUTH RINSE: 15.0000 mL | Freq: Once | OROMUCOSAL | Status: AC

## 2022-03-19 MED ORDER — SODIUM CHLORIDE 0.9 % IV SOLN: INTRAVENOUS | Status: DC | PRN

## 2022-03-19 MED ORDER — ACETAMINOPHEN 500 MG PO TABS
1000.0000 mg | ORAL_TABLET | ORAL | Status: AC
  Administered 2022-03-19: 1000 mg via ORAL
  Filled 2022-03-19: qty 2

## 2022-03-19 MED ORDER — LIDOCAINE 2% (20 MG/ML) 5 ML SYRINGE
INTRAMUSCULAR | Status: AC
  Filled 2022-03-19: qty 5

## 2022-03-19 MED ORDER — ROCURONIUM BROMIDE 10 MG/ML (PF) SYRINGE
PREFILLED_SYRINGE | INTRAVENOUS | Status: AC
  Filled 2022-03-19: qty 10

## 2022-03-19 MED ORDER — PROPOFOL 10 MG/ML IV BOLUS
INTRAVENOUS | Status: DC | PRN
  Administered 2022-03-19: 130 mg via INTRAVENOUS

## 2022-03-19 MED ORDER — LIDOCAINE 2% (20 MG/ML) 5 ML SYRINGE
INTRAMUSCULAR | Status: DC | PRN
  Administered 2022-03-19: 60 mg via INTRAVENOUS

## 2022-03-19 MED ORDER — 0.9 % SODIUM CHLORIDE (POUR BTL) OPTIME
TOPICAL | Status: DC | PRN
  Administered 2022-03-19: 1000 mL

## 2022-03-19 MED ORDER — FENTANYL CITRATE (PF) 250 MCG/5ML IJ SOLN
INTRAMUSCULAR | Status: DC | PRN
  Administered 2022-03-19: 50 ug via INTRAVENOUS

## 2022-03-19 MED ORDER — BUPIVACAINE LIPOSOME 1.3 % IJ SUSP
INTRAMUSCULAR | Status: AC
  Filled 2022-03-19: qty 20

## 2022-03-19 MED ORDER — ONDANSETRON HCL 4 MG/2ML IJ SOLN
INTRAMUSCULAR | Status: AC
  Filled 2022-03-19: qty 2

## 2022-03-19 MED ORDER — DEXAMETHASONE SODIUM PHOSPHATE 10 MG/ML IJ SOLN
INTRAMUSCULAR | Status: AC
  Filled 2022-03-19: qty 1

## 2022-03-19 MED ORDER — PHENYLEPHRINE 80 MCG/ML (10ML) SYRINGE FOR IV PUSH (FOR BLOOD PRESSURE SUPPORT)
PREFILLED_SYRINGE | INTRAVENOUS | Status: DC | PRN
  Administered 2022-03-19 (×6): 80 ug via INTRAVENOUS

## 2022-03-19 MED ORDER — FENTANYL CITRATE (PF) 100 MCG/2ML IJ SOLN: 25.0000 ug | INTRAMUSCULAR | Status: DC | PRN

## 2022-03-19 MED ORDER — SODIUM CHLORIDE (PF) 0.9 % IJ SOLN
INTRAMUSCULAR | Status: DC | PRN
  Administered 2022-03-19: 28 mL

## 2022-03-19 MED ORDER — INSULIN ASPART 100 UNIT/ML IJ SOLN
0.0000 [IU] | INTRAMUSCULAR | Status: DC | PRN
  Administered 2022-03-19: 2 [IU] via SUBCUTANEOUS

## 2022-03-19 MED ORDER — LACTATED RINGERS IV SOLN: INTRAVENOUS | Status: DC

## 2022-03-19 MED ORDER — PHENYLEPHRINE 80 MCG/ML (10ML) SYRINGE FOR IV PUSH (FOR BLOOD PRESSURE SUPPORT)
PREFILLED_SYRINGE | INTRAVENOUS | Status: AC
  Filled 2022-03-19: qty 10

## 2022-03-19 MED ORDER — INSULIN ASPART 100 UNIT/ML IJ SOLN
INTRAMUSCULAR | Status: AC
  Filled 2022-03-19: qty 1

## 2022-03-19 MED ORDER — SUGAMMADEX SODIUM 200 MG/2ML IV SOLN
INTRAVENOUS | Status: DC | PRN
  Administered 2022-03-19: 200 mg via INTRAVENOUS

## 2022-03-19 SURGICAL SUPPLY — 38 items
BAG COUNTER SPONGE SURGICOUNT (BAG) ×2 IMPLANT
BLADE CLIPPER SURG (BLADE) IMPLANT
CANISTER SUCT 3000ML PPV (MISCELLANEOUS) IMPLANT
CHLORAPREP W/TINT 26 (MISCELLANEOUS) ×2 IMPLANT
COVER SURGICAL LIGHT HANDLE (MISCELLANEOUS) ×2 IMPLANT
DERMABOND ADVANCED .7 DNX12 (GAUZE/BANDAGES/DRESSINGS) ×2 IMPLANT
DERMABOND ADVANCED .7 DNX6 (GAUZE/BANDAGES/DRESSINGS) IMPLANT
DRAIN PENROSE 1/2X12 LTX STRL (WOUND CARE) IMPLANT
DRAPE LAPAROTOMY TRNSV 102X78 (DRAPES) ×2 IMPLANT
ELECT REM PT RETURN 9FT ADLT (ELECTROSURGICAL) ×2
ELECTRODE REM PT RTRN 9FT ADLT (ELECTROSURGICAL) ×2 IMPLANT
GLOVE BIO SURGEON STRL SZ8 (GLOVE) ×2 IMPLANT
GLOVE BIOGEL PI IND STRL 8 (GLOVE) ×2 IMPLANT
GOWN STRL REUS W/ TWL LRG LVL3 (GOWN DISPOSABLE) ×2 IMPLANT
GOWN STRL REUS W/ TWL XL LVL3 (GOWN DISPOSABLE) ×2 IMPLANT
GOWN STRL REUS W/TWL LRG LVL3 (GOWN DISPOSABLE) ×2
GOWN STRL REUS W/TWL XL LVL3 (GOWN DISPOSABLE) ×2
KIT BASIN OR (CUSTOM PROCEDURE TRAY) ×2 IMPLANT
KIT TURNOVER KIT B (KITS) ×2 IMPLANT
MESH HERNIA SYS ULTRAPRO LRG (Mesh General) IMPLANT
NDL HYPO 25GX1X1/2 BEV (NEEDLE) ×2 IMPLANT
NEEDLE HYPO 25GX1X1/2 BEV (NEEDLE) ×2 IMPLANT
NS IRRIG 1000ML POUR BTL (IV SOLUTION) ×2 IMPLANT
PACK GENERAL/GYN (CUSTOM PROCEDURE TRAY) ×2 IMPLANT
PAD ARMBOARD 7.5X6 YLW CONV (MISCELLANEOUS) ×2 IMPLANT
PENCIL SMOKE EVACUATOR (MISCELLANEOUS) ×2 IMPLANT
SUT MNCRL AB 4-0 PS2 18 (SUTURE) ×2 IMPLANT
SUT NOVA NAB DX-16 0-1 5-0 T12 (SUTURE) ×2 IMPLANT
SUT SILK 2 0 SH (SUTURE) IMPLANT
SUT VIC AB 0 CT1 27 (SUTURE)
SUT VIC AB 0 CT1 27XBRD ANBCTR (SUTURE) IMPLANT
SUT VIC AB 2-0 SH 27 (SUTURE) ×2
SUT VIC AB 2-0 SH 27X BRD (SUTURE) ×2 IMPLANT
SUT VIC AB 3-0 SH 18 (SUTURE) ×2 IMPLANT
SUT VICRYL AB 3 0 TIES (SUTURE) ×2 IMPLANT
SYR CONTROL 10ML LL (SYRINGE) ×2 IMPLANT
TOWEL GREEN STERILE (TOWEL DISPOSABLE) ×2 IMPLANT
TOWEL GREEN STERILE FF (TOWEL DISPOSABLE) ×2 IMPLANT

## 2022-03-19 NOTE — Interval H&P Note (Signed)
History and Physical Interval Note:  03/19/2022 7:26 AM  Tyler Patterson  has presented today for surgery, with the diagnosis of [place].  The various methods of treatment have been discussed with the patient and family. After consideration of risks, benefits and other options for treatment, the patient has consented to  Procedure(s): RIGHT INGUINAL HERNIA REPAIR WITH MESH (Right) as a surgical intervention.  The patient's history has been reviewed, patient examined, no change in status, stable for surgery.  I have reviewed the patient's chart and labs.  Questions were answered to the patient's satisfaction.    The risk of hernia repair include bleeding,  Infection,   Recurrence of the hernia,  Mesh use, chronic pain,  Organ injury,  Bowel injury,  Bladder injury,   nerve injury with numbness around the incision,  Death,  and worsening of preexisting  medical problems.  The alternatives to surgery have been discussed as well..  Long term expectations of both operative and non operative treatments have been discussed.   The patient agrees to proceed.  [place]

## 2022-03-19 NOTE — Transfer of Care (Signed)
Immediate Anesthesia Transfer of Care Note  Patient: Tyler Patterson  Procedure(s) Performed: RIGHT INGUINAL HERNIA REPAIR WITH MESH (Right) INSERTION OF MESH (Right: Inguinal)  Patient Location: PACU  Anesthesia Type:General  Level of Consciousness: drowsy and patient cooperative  Airway & Oxygen Therapy: Patient Spontanous Breathing and Patient connected to face mask oxygen  Post-op Assessment: Report given to RN and Post -op Vital signs reviewed and stable  Post vital signs: Reviewed and stable  Last Vitals:  Vitals Value Taken Time  BP 164/72 03/19/22 0854  Temp    Pulse 62 03/19/22 0857  Resp 14 03/19/22 0857  SpO2 100 % 03/19/22 0857  Vitals shown include unvalidated device data.  Last Pain:  Vitals:   03/19/22 0607  TempSrc:   PainSc: 0-No pain      Patients Stated Pain Goal: 0 (09/64/38 3818)  Complications: No notable events documented.

## 2022-03-19 NOTE — Discharge Instructions (Signed)
[facility] _______[facility]  UMBILICAL OR INGUINAL HERNIA REPAIR: POST OP INSTRUCTIONS  Always review your discharge instruction sheet given to you by the facility where your surgery was performed. IF YOU HAVE DISABILITY OR FAMILY LEAVE FORMS, YOU MUST BRING THEM TO THE OFFICE FOR PROCESSING.   DO NOT GIVE THEM TO YOUR DOCTOR.  1. A  prescription for pain medication may be given to you upon discharge.  Take your pain medication as prescribed, if needed.  If narcotic pain medicine is not needed, then you may take acetaminophen (Tylenol) or ibuprofen (Advil) as needed. 2. Take your usually prescribed medications unless otherwise directed. If you need a refill on your pain medication, please contact your pharmacy.  They will contact our office to request authorization. Prescriptions will not be filled after 5 pm or on week-ends. 3. You should follow a light diet the first 24 hours after arrival home, such as soup and crackers, etc.  Be sure to include lots of fluids daily.  Resume your normal diet the day after surgery. 4.Most patients will experience some swelling and bruising around the umbilicus or in the groin and scrotum.  Ice packs and reclining will help.  Swelling and bruising can take several days to resolve.  6. It is common to experience some constipation if taking pain medication after surgery.  Increasing fluid intake and taking a stool softener (such as Colace) will usually help or prevent this problem from occurring.  A mild laxative (Milk of Magnesia or Miralax) should be taken according to package directions if there are no bowel movements after 48 hours. 7. Unless discharge instructions indicate otherwise, you may remove your bandages 24-48 hours after surgery, and you may shower at that time.  You may have steri-strips (small skin tapes) in place directly over the incision.  These strips should be left on the skin for 7-10 days.  If your surgeon used skin glue on the  incision, you may shower in 24 hours.  The glue will flake off over the next 2-3 weeks.  Any sutures or staples will be removed at the office during your follow-up visit. 8. ACTIVITIES:  You may resume regular (light) daily activities beginning the next day--such as daily self-care, walking, climbing stairs--gradually increasing activities as tolerated.  You may have sexual intercourse when it is comfortable.  Refrain from any heavy lifting or straining until approved by your doctor.  a.You may drive when you are no longer taking prescription pain medication, you can comfortably wear a seatbelt, and you can safely maneuver your car and apply brakes. b.RETURN TO WORK:   _____________________________________________  9.You should see your doctor in the office for a follow-up appointment approximately 2-3 weeks after your surgery.  Make sure that you call for this appointment within a day or two after you arrive home to insure a convenient appointment time. 10.OTHER INSTRUCTIONS: _________________________    _____________________________________  WHEN TO CALL YOUR DOCTOR: Fever over 101.0 Inability to urinate Nausea and/or vomiting Extreme swelling or bruising Continued bleeding from incision. Increased pain, redness, or drainage from the incision  The clinic staff is available to answer your questions during regular business hours.  Please don't hesitate to call and ask to speak to one of the nurses for clinical concerns.  If you have a medical emergency, go to the nearest emergency room or call 911.  A surgeon from [facility] is always on call at the hospital   [address], [address],   [place], [place]  [address] ?  [address] [address], [place],    [address] [phone] ? [phone] ? FAX [phone] Web site: [email or web address]  

## 2022-03-19 NOTE — Anesthesia Postprocedure Evaluation (Signed)
Anesthesia Post Note  Patient: Tyler Patterson  Procedure(s) Performed: RIGHT INGUINAL HERNIA REPAIR WITH MESH (Right) INSERTION OF MESH (Right: Inguinal)     Patient location during evaluation: PACU Anesthesia Type: General Level of consciousness: awake Pain management: pain level controlled Vital Signs Assessment: post-procedure vital signs reviewed and stable Respiratory status: spontaneous breathing Cardiovascular status: stable Postop Assessment: no apparent nausea or vomiting Anesthetic complications: no   No notable events documented.  Last Vitals:  Vitals:   03/19/22 0915 03/19/22 0930  BP: (!) 143/60 (!) 147/61  Pulse: 64 63  Resp: 10 11  Temp:  36.7 C  SpO2: 100% 96%    Last Pain:  Vitals:   03/19/22 0930  TempSrc:   PainSc: 0-No pain                 Horice Carrero

## 2022-03-19 NOTE — Anesthesia Procedure Notes (Signed)
Procedure Name: Intubation Date/Time: 03/19/2022 7:38 AM  Performed by: Genelle Bal, CRNAPre-anesthesia Checklist: Patient identified, Emergency Drugs available, Suction available and Patient being monitored Patient Re-evaluated:Patient Re-evaluated prior to induction Oxygen Delivery Method: Circle system utilized Preoxygenation: Pre-oxygenation with 100% oxygen Induction Type: IV induction Ventilation: Mask ventilation without difficulty Laryngoscope Size: Miller and 2 Grade View: Grade I Tube type: Oral Tube size: 7.0 mm Number of attempts: 1 Airway Equipment and Method: Stylet and Oral airway Placement Confirmation: ETT inserted through vocal cords under direct vision, positive ETCO2 and breath sounds checked- equal and bilateral Secured at: 22 cm Tube secured with: Tape Dental Injury: Teeth and Oropharynx as per pre-operative assessment

## 2022-03-19 NOTE — H&P (Signed)
History of Present Illness: Tyler Patterson is a 71 y.o. male who is seen today as an office consultation for evaluation of New Consultation (R inguinal hernia) .   Patient presents for evaluation of symptomatic right inguinal hernia. He has had a bulge and pain in his right groin swelling for the last 3 to 4 months. It is getting larger and causing more discomfort especially when he stands or exerts himself. No nausea vomiting. No change in bowel or bladder function.  Review of Systems: A complete review of systems was obtained from the patient. I have reviewed this information and discussed as appropriate with the patient. See HPI as well for other ROS.    Medical History: Past Medical History:  Diagnosis Date  Chronic kidney disease  Hypertension  Liver disease   There is no problem list on file for this patient.  No past surgical history on file.   Allergies  Allergen Reactions  Butalbital-Aspirin-Caffeine Swelling   Current Outpatient Medications on File Prior to Visit  Medication Sig Dispense Refill  cholecalciferol (CHOLECALCIFEROL) 1000 unit tablet Take by mouth  FUROsemide (LASIX) 20 MG tablet Take 20 mg by mouth once daily  atorvastatin (LIPITOR) 10 MG tablet Take 10 mg by mouth once daily (Patient not taking: Reported on 02/10/2022)  carvediloL (COREG) 25 MG tablet Take by mouth (Patient not taking: Reported on 02/10/2022)  FUROsemide (LASIX) 10 mg/mL injection Inject into the vein once (Patient not taking: Reported on 02/10/2022)  hydrALAZINE (APRESOLINE) 50 MG tablet Take 50 mg by mouth 3 (three) times daily (Patient not taking: Reported on 02/10/2022)  losartan (COZAAR) 100 MG tablet Take by mouth (Patient not taking: Reported on 02/10/2022)  pregabalin (LYRICA) 150 MG capsule Take 150 mg by mouth 2 (two) times daily (Patient not taking: Reported on 02/10/2022)  thiamine (VITAMIN B-1) 100 mg/mL injection Inject into the muscle once (Patient not taking: Reported on  02/10/2022)   No current facility-administered medications on file prior to visit.   Family History  Problem Relation Age of Onset  High blood pressure (Hypertension) Mother  Diabetes Sister    Social History   Tobacco Use  Smoking Status Smoker, Current Status Unknown  Smokeless Tobacco Never    Social History   Socioeconomic History  Marital status: Married  Tobacco Use  Smoking status: Smoker, Current Status Unknown  Smokeless tobacco: Never  Substance and Sexual Activity  Alcohol use: Yes  Comment: 4 per weekend  Drug use: Never   Objective:   Vitals:  02/10/22 0854  Weight: 66.9 kg (147 lb 6.4 oz)   Body mass index is 22.41 kg/m.  Physical Exam Cardiovascular:  Rate and Rhythm: Normal rate.  Pulmonary:  Effort: Pulmonary effort is normal.  Breath sounds: No stridor.  Abdominal:  Hernia: A hernia is present. Hernia is present in the right inguinal area. There is no hernia in the left inguinal area.   Comments: Scar noted  Reducible right inguinal hernia.  Musculoskeletal:  General: Normal range of motion.  Cervical back: Normal range of motion.  Skin: General: Skin is warm.  Neurological:  General: No focal deficit present.  Mental Status: He is alert.  Psychiatric:  Mood and Affect: Mood normal.  Behavior: Behavior normal.     Assessment and Plan:   Diagnoses and all orders for this visit:  Non-recurrent unilateral inguinal hernia without obstruction or gangrene Comments: right    Discussed repair of his right inguinal hernia with mesh. Risks and benefits of surgery reviewed. He  wishes to proceed with repair of his right inguinal hernia with mesh. Risk of bleeding, infection, recurrence, chronic pain, organ injury, bowel injury, bladder injury, blood vessel injury, the need for treatment center procedures and exacerbation of his underlying medical problems. He does get hemodialysis on Monday Wednesday and Friday and is compliant with  that.  No follow-ups on file.  Kennieth Francois, MD

## 2022-03-19 NOTE — Op Note (Signed)
Right inguinal Hernia with mesh , Open, Procedure Note  Indications: The patient presented with a history of a right, reducible  inguinal hernia.  The risk of hernia repair include bleeding,  Infection,   Recurrence of the hernia,  Mesh use, chronic pain,  Organ injury,  Bowel injury,  Bladder injury,   nerve injury with numbness around the incision,  Death,  and worsening of preexisting  medical problems.  The alternatives to surgery have been discussed as well..  Long term expectations of both operative and non operative treatments have been discussed.   The patient agrees to proceed.   Pre-operative Diagnosis: right reducible indirect inguinal hernia   Post-operative Diagnosis: same  Surgeon: Turner Daniels  MD   Assistants: OR staff  Anesthesia: General endotracheal anesthesia and Local anesthesia Exparel 20 cc diluted by 20 cc saline   ASA Class: 3  Procedure Details  The patient was seen again in the Holding Room. The risks, benefits, complications, treatment options, and expected outcomes were discussed with the patient. The possibilities of reaction to medication, pulmonary aspiration, perforation of viscus, bleeding, recurrent infection, the need for additional procedures, and development of a complication requiring transfusion or further operation were discussed with the patient and/or family. There was concurrence with the proposed plan, and informed consent was obtained. The site of surgery was properly noted/marked. The patient was taken to the Operating Room, identified as Tyler Patterson, and the procedure verified as hernia repair. A Time Out was held and the above information confirmed.  The patient was placed in the supine position and underwent induction of anesthesia, the lower abdomen and groin was prepped and draped in the standard fashion, and Exparel 20 cc/20cc saline  was used to anesthetize the skin over the mid-portion of the inguinal canal. A transverse incision was  made. Dissection was carried through the soft tissue to expose the inguinal canal and inguinal ligament along its lower edge. The external oblique fascia was split along the course of its fibers, exposing the inguinal canal. The cord and nerve were looped using a Penrose drain and reflected out of the field. The defect was exposed and a piece of prolene hernia system ultrapro mesh was and placed into  the defect. Interupted 1-0 novafil suture was then used  to repair the defect, with the suture being sewn from the pubic tubercle inferiorly and superiorly along the canal to a level just beyond the internal ring. The mesh was split to allow passage of the cord and nerve into the canal without entrapment.The ilioinguinal  nerve was divided since it was tethered  under the mesh.  The contents were then returned to canal and the external oblique fashion was then closed in a continuous fashion using 2-0 Vicryl suture taking care not to cause entrapment. Scarpa's layer closed with 3 0 vicryl and 4 0 monocryl used to close the skin.  Dermabond used for dressing.  Instrument, sponge, and needle counts were correct prior to closure and at the conclusion of the case.  Findings: Hernia as above  Estimated Blood Loss: Minimal         Drains: None         Total IV Fluids: per or record          Specimens: none               Complications: None; patient tolerated the procedure well.         Disposition: PACU - hemodynamically stable.  Condition: stable

## 2022-03-20 ENCOUNTER — Encounter (HOSPITAL_COMMUNITY): Payer: Self-pay | Admitting: Surgery

## 2022-04-03 IMAGING — MR MR HEAD W/O CM
9 of 10 series · 38 of 48 positions shown · non-contrast
Comparison: Head CT 02/27/2017 and MRI 08/27/2016

CLINICAL DATA: Neuro deficit, acute, stroke suspected. Extremity
weakness following hernia repair surgery today.

EXAM:
MRI HEAD WITHOUT CONTRAST
TECHNIQUE: Multiplanar, multiecho pulse sequences of the brain and surrounding
structures were obtained without intravenous contrast.

[Series 3: DWI · axial · 3.0mm · 1.09mm/px · z∈[-109,+40]mm · 11 of 102 slices shown (1 of 4)]
[im 1/102]
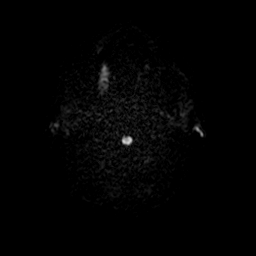
[im 11/102]
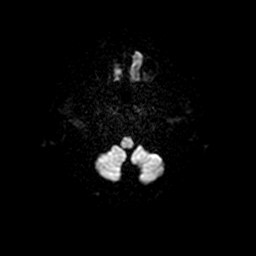
[im 21/102]
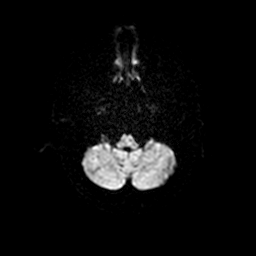
[im 31/102]
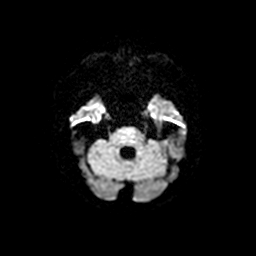
[im 41/102]
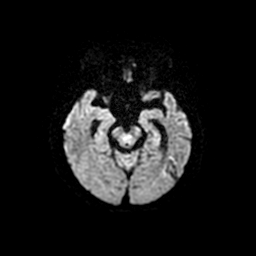
[im 51/102]
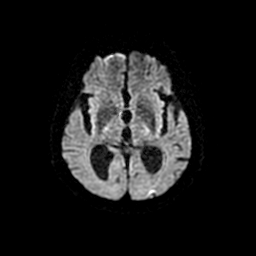
[im 61/102]
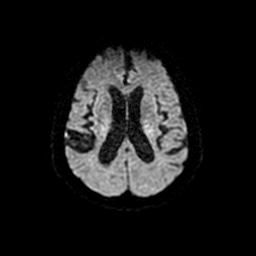
[im 71/102]
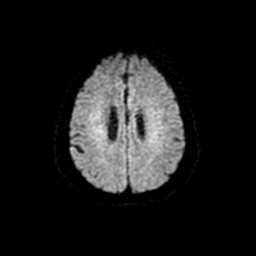
[im 81/102]
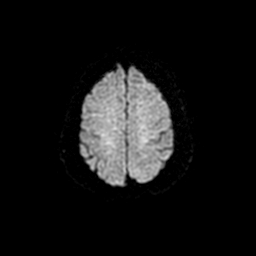
[im 91/102]
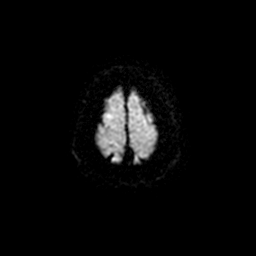
[im 102/102]
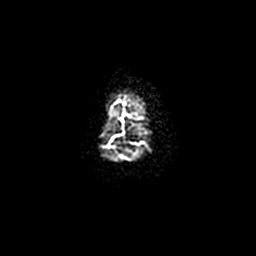

[Series 4: DWI · coronal · 5.0mm · 1.09mm/px · 8 of 76 slices shown (2 of 4)]
[im 1/76]
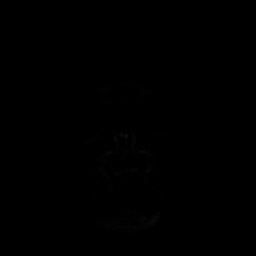
[im 11/76]
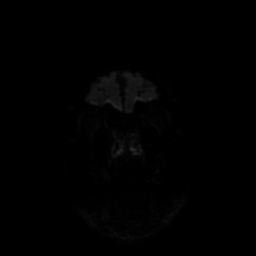
[im 22/76]
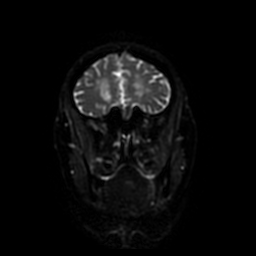
[im 33/76]
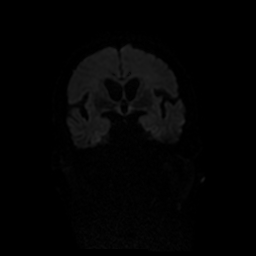
[im 43/76]
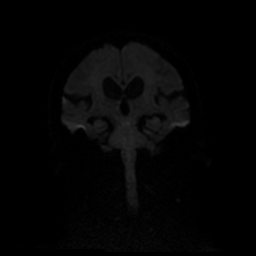
[im 54/76]
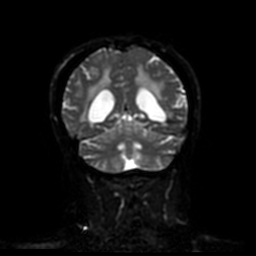
[im 65/76]
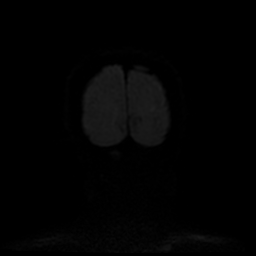
[im 76/76]
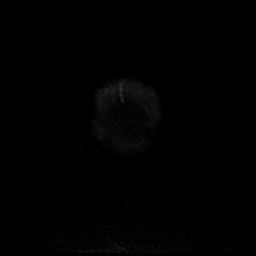

[Series 5: T1 · sagittal · 5.0mm · 0.47mm/px · 2 of 23 slices shown (1 of 2)]
[im 1/23]
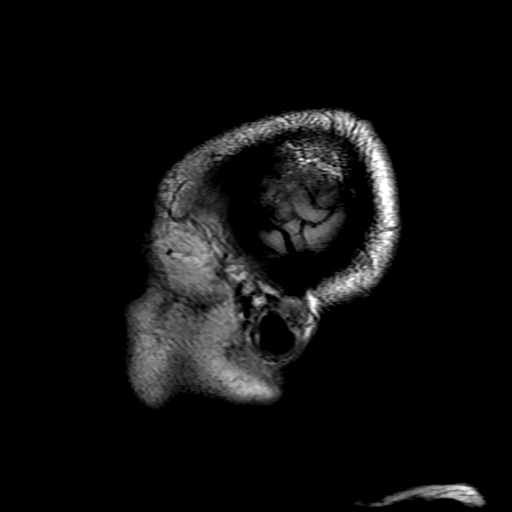
[im 23/23]
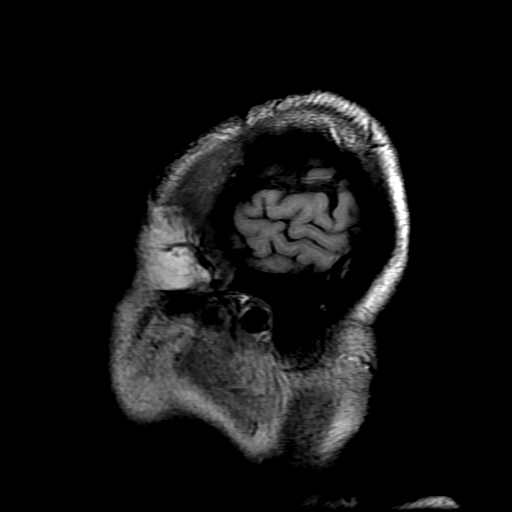

[Series 6: T2 · axial · 5.0mm · 0.43mm/px · z∈[-108,+36]mm · 2 of 25 slices shown (1 of 2)]
[im 1/25]
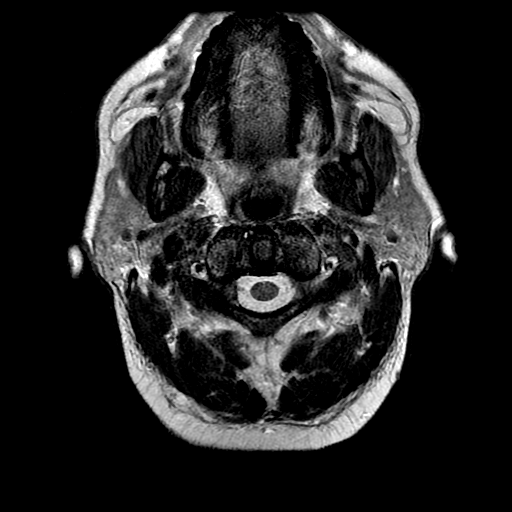
[im 25/25]
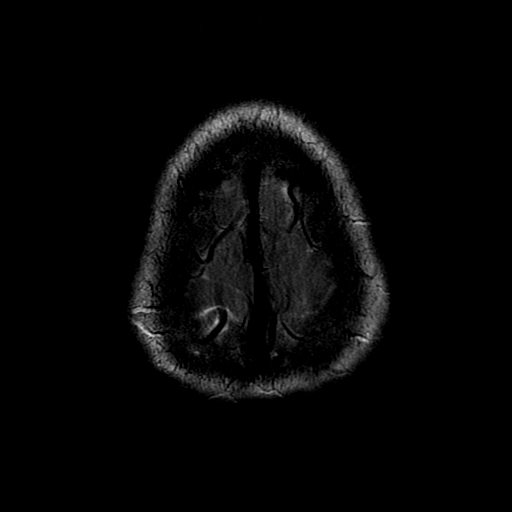

[Series 7: FLAIR · axial · 3.0mm · 0.43mm/px · z∈[-108,+36]mm · 2 of 25 slices shown]
[im 1/25]
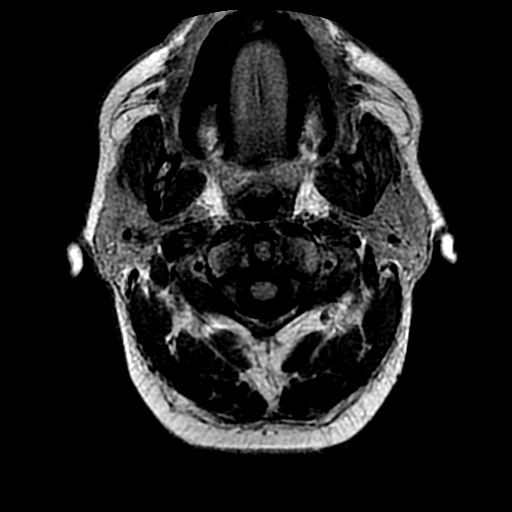
[im 25/25]
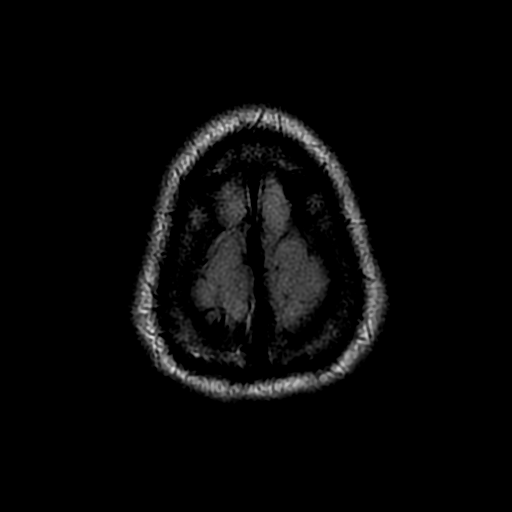

[Series 9: T1 · axial · 3.0mm · 0.43mm/px · z∈[-109,-93]mm · 2 of 100 slices shown (2 of 2)]
[im 1/100]
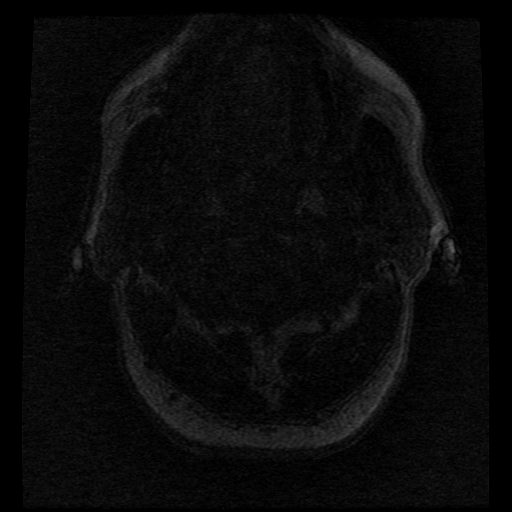
[im 12/100]
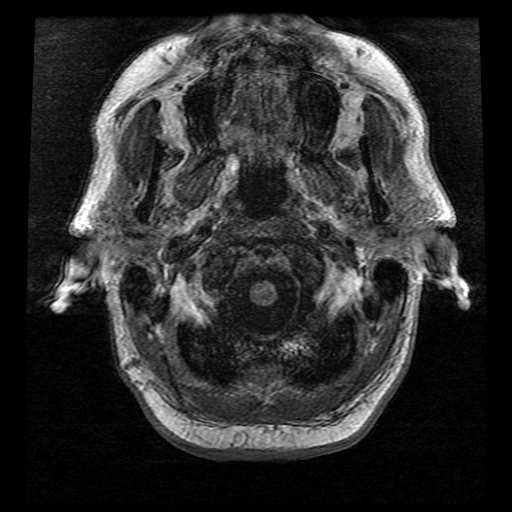

[Series 10: T2 · coronal · 5.0mm · 0.39mm/px · 2 of 24 slices shown (2 of 2)]
[im 1/24]
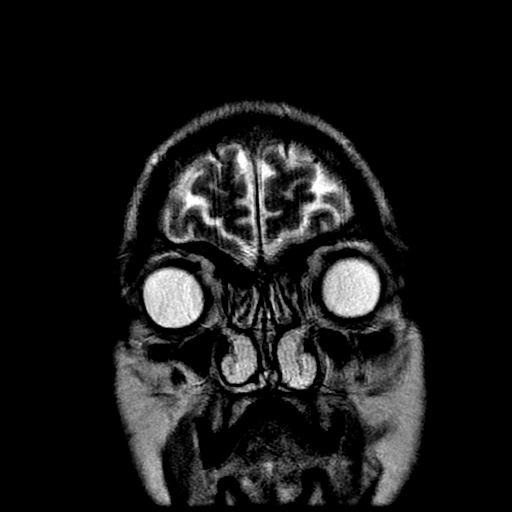
[im 24/24]
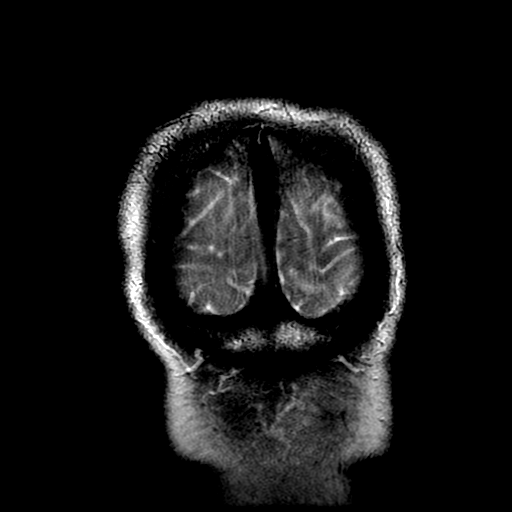

[Series 300: DWI · axial · 3.0mm · 1.09mm/px · z∈[-109,+40]mm · 5 of 51 slices shown (3 of 4)]
[im 1/51]
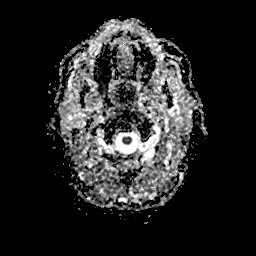
[im 13/51]
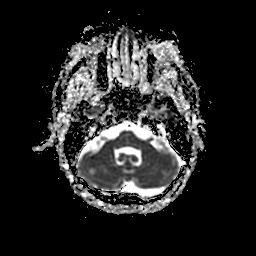
[im 26/51]
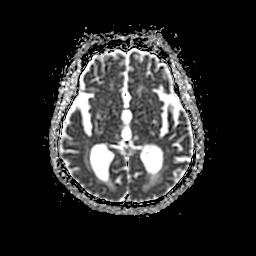
[im 38/51]
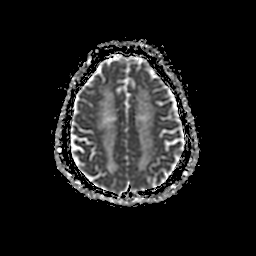
[im 51/51]
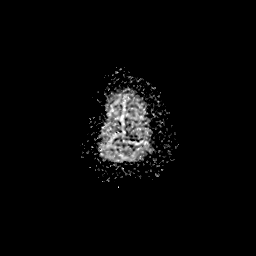

[Series 400: DWI · coronal · 5.0mm · 1.09mm/px · 4 of 38 slices shown (4 of 4)]
[im 1/38]
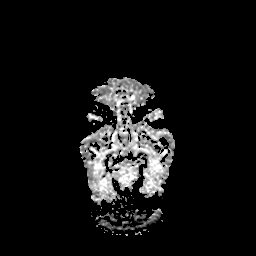
[im 13/38]
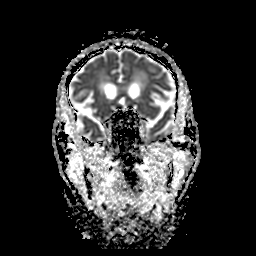
[im 25/38]
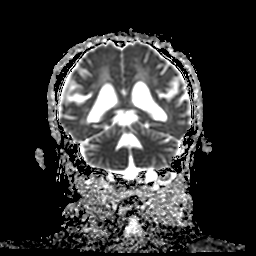
[im 38/38]
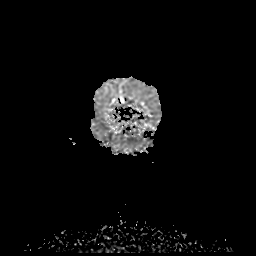

[38 of 48 positions shown; findings below may reference images not displayed]

FINDINGS: Brain: A 7 mm focus of trace diffusion weighted signal
hyperintensity laterally over the right temporoparietal convexity is
favored to reflect a small extra-axial mass rather than acute
cortical infarct. There is no associated edema.

No definite acute infarct, midline shift, or extra-axial fluid
collection is identified. Small chronic infarcts in the posterior
left temporal lobe and left occipital lobe with associated chronic
blood products are new from the prior MRI. Confluent T2
hyperintensities in the cerebral white matter and pons have mildly
progressed from the prior MRI and are nonspecific but compatible
with severe chronic small vessel ischemic disease. Chronic lacunar
infarcts have increased in number from the prior MRI and involve the
deep cerebral white matter, bilateral basal ganglia, thalami, and
cerebellum. There is moderate cerebral atrophy.

Vascular: Major intracranial vascular flow voids are preserved.

Skull and upper cervical spine: Unremarkable bone marrow signal.

Sinuses/Orbits: Bilateral cataract extraction. Mild mucosal
thickening in the paranasal sinuses. Small mucous retention cyst in
the left maxillary sinus.

Other: None.
IMPRESSION: 1. No definite acute intracranial abnormality.
2. Suspected 7 mm extra-axial mass over the right temporoparietal
convexity, most likely a meningioma. Postcontrast brain MRI is
recommended for further evaluation.
3. Severe chronic small vessel ischemic disease, progressed from

## 2022-04-08 ENCOUNTER — Encounter (HOSPITAL_COMMUNITY): Payer: Self-pay

## 2022-04-15 ENCOUNTER — Ambulatory Visit: Payer: 59 | Admitting: Internal Medicine

## 2022-04-15 NOTE — Progress Notes (Deleted)
Name: Tyler Patterson  MRN/ DOB: [id number], 09/25/1950   Age/ Sex: 72 y.o., male    PCP: Jilda Panda, MD   Reason for Endocrinology Evaluation: Type 2 Diabetes Mellitus     Date of Initial Endocrinology Visit: 06/13/2021    PATIENT IDENTIFIER: [name] is a 72 y.o. male with a past medical history of T2DM and HTN, CKD IV , Hx of alcohol abuse . The patient presented for initial endocrinology clinic visit on 06/13/2021 for consultative assistance with his diabetes management.    HPI: Tyler Patterson  is accompanied by his two sisters    Diagnosed with DM in 2011 Prior Medications tried/Intolerance: Novolog/Lantus stopped during the pandemic due to loss of transportation .             Hemoglobin A1c has ranged from 8.0% in 2022, peaking at 13.5% in 2018. Patient required assistance for hypoglycemia: yes - 2021   On his initial visit to our clinic his A1c was 11.0%, he was started on prandial insulin per correction scale due to fear of hypoglycemia    SUBJECTIVE:   During the last visit (10/13/2021): A1c 8.3 %     Today (04/15/22): Tyler Patterson is here for follow-up on diabetes management. He is accompanied by his sister today . He checks his blood sugars multiple times daily, through CGM. The patient did not bring his CGM today .He  had hypoglycemic episodes since the last clinic visit   S/p right inguinal hernia 03/19/2022 S/P AV fistual 07/2021  Hemodialysis days MWF-nephrologist Dr. Madelon Lips  He has been evaluated by the transplant team  HOME DIABETES REGIMEN: Humalog per correction scale (BG -130/45)      Statin: yes ACE-I/ARB: yes     CONTINUOUS GLUCOSE MONITORING RECORD INTERPRETATION  Did not bring     DIABETIC COMPLICATIONS: Microvascular complications:  CKD IV, neuropathy Denies: retinopathy Last eye exam: Completed > 2 yrs ago Macrovascular complications:   Denies: CAD, PVD, CVA   PAST HISTORY: Past Medical History:  Past Medical  History:  Diagnosis Date   Alcohol abuse    Anemia    Chronic kidney disease    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   COPD (chronic obstructive pulmonary disease) (HCC)    Depression    Diabetes mellitus    type 2   Headache 05/13/2013   Headache(784.0)    Hypertension    PONV (postoperative nausea and vomiting)    Spinal stenosis    04/25/21 MRI ([facility]): "Neurogenic claudication secondary to spinal stenosis at L3-4 moderate to severe. Lateral recess stenosis L5-S1 to the left. 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 within thalamus and bilateral basal ganglia 10/24/21 CT   Past Surgical History:  Past Surgical History:  Procedure Laterality Date   AV FISTULA PLACEMENT Left 05/28/2021   Procedure: LEFT BRACHIOBASILIC ARTERIOVENOUS FISTULA CREATION;  Surgeon: [name], MD;  Location: [place];  Service: Vascular;  Laterality: Left;  PERIPHERAL NERVE BLOCK   BASCILIC VEIN TRANSPOSITION Left 07/28/2021   Procedure: SECOND STAGE BASILIC VEIN FISTULA;  Surgeon: [name], MD;  Location: [place];  Service: Vascular;  Laterality: Left;  PERIPHERAL NERVE BLOCK   COLON SURGERY     Hemicolectomy for benign colon mass   HERNIA  REPAIR     INCISIONAL HERNIA REPAIR N/A 01/14/2021   Procedure: REPAIR OF INCISIONAL HERNIA WITH MESH;  Surgeon: Erroll Luna, MD;  Location: [place];  Service: General;  Laterality: N/A;   INGUINAL HERNIA REPAIR Right 03/19/2022   Procedure: RIGHT INGUINAL HERNIA REPAIR WITH MESH;  Surgeon: Erroll Luna, MD;  Location: [place];  Service: General;  Laterality: Right;   INSERTION OF MESH Right 03/19/2022   Procedure: INSERTION OF MESH;  Surgeon: Erroll Luna, MD;  Location: [place];  Service: General;  Laterality: Right;    Social History:   reports that he quit smoking about 13 months ago. His smoking use included cigars and cigarettes. He smoked an average of .25 packs per day. He has never used smokeless tobacco. He reports that he does not currently use alcohol. He reports that he does not use drugs. Family History:  Family History  Problem Relation Age of Onset   Lung cancer Mother    Lung cancer Father    Diabetes Mellitus II Neg Hx      HOME MEDICATIONS: Allergies as of 04/16/2022       Reactions   Duloxetine    Other reaction(s): Abdominal pain   Fiorinal [butalbital-aspirin-caffeine] Swelling   Spironolactone    Other reaction(s): Gynecomastia        Medication List        Accurate as of April 15, 2022 12:13 PM. If you have any questions, ask your nurse or doctor.          aspirin EC 81 MG tablet Take 81 mg by mouth daily. Swallow whole.   atorvastatin 40 MG tablet Commonly known as: LIPITOR Take 40 mg by mouth daily.   BD Pen Needle Nano U/F 32G X 4 MM Misc Generic drug: Insulin Pen Needle 1 Device by Does not apply route 3 (three) times daily.   Dialyvite 5000 5 MG Tabs Take 5 mg by mouth daily.   FreeStyle Libre 2 Sensor Misc 1 Device by Does not apply route every 14 (fourteen) days.   furosemide 80 MG tablet Commonly known as: LASIX Take 80 mg by mouth 2 (two) times daily.   insulin lispro 100 UNIT/ML KwikPen Commonly known as: HumaLOG KwikPen Max daily 20 units What changed:  how much to take when to take this additional instructions   OneTouch Verio test strip Generic drug: glucose blood 1 each by Other route 3 (three) times daily. Use as instructed   oxyCODONE 5 MG immediate release tablet Commonly known as: Roxicodone Take 1 tablet (5 mg total) by mouth every 8 (eight) hours as needed.   vitamin D3 50 MCG (2000 UT) Caps Take 2,000 Units by mouth daily.         ALLERGIES: Allergies  Allergen Reactions   Duloxetine     Other reaction(s): Abdominal pain    Fiorinal [Butalbital-Aspirin-Caffeine] Swelling   Spironolactone     Other reaction(s): Gynecomastia     REVIEW OF SYSTEMS: A comprehensive ROS was conducted with the patient and is negative except as per HPI     OBJECTIVE:   VITAL SIGNS: There were no vitals taken for this visit.   PHYSICAL EXAM:  General: Pt appears well and is in NAD  Lungs: Clear with good BS bilat   Heart: RRR   Extremities: 1+ pretibial edema.   Neuro: MS is good with appropriate affect, pt is alert and Ox3     DATA REVIEWED:  Lab Results  Component Value Date  HGBA1C 8.8 (H) 03/17/2022   HGBA1C 8.3 (A) 10/13/2021   HGBA1C 11.0 (A) 06/13/2021     Latest Reference Range & Units 03/19/22 06:09  Sodium 135 - 145 mmol/L 135  Potassium 3.5 - 5.1 mmol/L 4.9  Chloride 98 - 111 mmol/L 96 (L)  Glucose 70 - 99 mg/dL 216 (H)  BUN 8 - 23 mg/dL 43 (H)  Creatinine 0.61 - 1.24 mg/dL 6.70 (H)  Calcium Ionized 1.15 - 1.40 mmol/L 1.07 (L)     ASSESSMENT / PLAN / RECOMMENDATIONS:   1) Type 2 Diabetes Mellitus, poorly controlled, With CKD IV complications - Most recent A1c of 8.8 %. Goal A1c <7.0%.     - A1c trended down from 11.0% to 8.3 %  - limited oral glycemic agents due to CKD IV - Discussed importance of having glucose data available to me during these visit, no changes at this this  -I would not put him on basal insulin at this time unless more  accurate data is available - Fasting in-office BG 166 mg/dL   MEDICATIONS: Continue Humalog (BG -130/45)  TIDQAC  EDUCATION / INSTRUCTIONS: BG monitoring instructions: Patient is instructed to check his blood sugars 3 times a day, before meals. Call [facility] if: BG persistently < 70  I reviewed the Rule of 15 for the treatment of hypoglycemia in detail with the patient. Literature supplied.   2) Diabetic complications:  Eye: Does not have known diabetic retinopathy.  Neuro/ Feet: Does not have known diabetic peripheral  neuropathy. Renal: Patient does  have known baseline CKD. He is  on an ACEI/ARB at present.    Follow-up in 6 months   Signed electronically by: Mack Guise, MD  [facility]  [address] [place], [place] [address] Phone: [phone] FAX: [phone]   CC: Jilda Panda, MD [address] [place] [address] Phone: [phone]  Fax: [phone]    Return to Endocrinology clinic as below: Future Appointments  Date Time Provider [place]  04/16/2022  9:50 AM Arkie Tagliaferro, Melanie Crazier, MD [id number] None

## 2022-04-16 ENCOUNTER — Ambulatory Visit: Payer: 59 | Admitting: Internal Medicine

## 2022-06-03 ENCOUNTER — Ambulatory Visit: Payer: 59 | Admitting: Internal Medicine

## 2022-06-16 ENCOUNTER — Ambulatory Visit (INDEPENDENT_AMBULATORY_CARE_PROVIDER_SITE_OTHER): Payer: 59 | Admitting: Internal Medicine

## 2022-06-16 ENCOUNTER — Encounter: Payer: Self-pay | Admitting: Internal Medicine

## 2022-06-16 VITALS — BP 136/80 | HR 81 | Ht 68.0 in | Wt 145.0 lb

## 2022-06-16 DIAGNOSIS — E1165 Type 2 diabetes mellitus with hyperglycemia: Secondary | ICD-10-CM

## 2022-06-16 DIAGNOSIS — E1122 Type 2 diabetes mellitus with diabetic chronic kidney disease: Secondary | ICD-10-CM | POA: Diagnosis not present

## 2022-06-16 DIAGNOSIS — Z992 Dependence on renal dialysis: Secondary | ICD-10-CM

## 2022-06-16 DIAGNOSIS — E1142 Type 2 diabetes mellitus with diabetic polyneuropathy: Secondary | ICD-10-CM | POA: Diagnosis not present

## 2022-06-16 DIAGNOSIS — Z794 Long term (current) use of insulin: Secondary | ICD-10-CM | POA: Diagnosis not present

## 2022-06-16 DIAGNOSIS — N186 End stage renal disease: Secondary | ICD-10-CM

## 2022-06-16 LAB — POCT GLYCOSYLATED HEMOGLOBIN (HGB A1C): Hemoglobin A1C: 9.5 % — AB (ref 4.0–5.6)

## 2022-06-16 MED ORDER — RYBELSUS 7 MG PO TABS: 7.0000 mg | ORAL_TABLET | Freq: Every day | ORAL | 3 refills | Status: DC

## 2022-06-16 NOTE — Patient Instructions (Addendum)
Start Rybelsus 3 mg , 1 tablet daily every morning and after a month start 7 mg tablet daily   Novolog correctional insulin: Use the scale below to help guide you BEFORE  Breakfast, Lunch and Dinner time   Blood sugar before meal Number of units to inject  Less than 175 0 unit  176 -  220 1 units  221 -  265 2 units  266 -  310 3 units  311 -  355 4 units  356 -  400 5 units  401 -  445 6 units   HOW TO TREAT LOW BLOOD SUGARS (Blood sugar LESS THAN 70 MG/DL) Please follow the RULE OF 15 for the treatment of hypoglycemia treatment (when your (blood sugars are less than 70 mg/dL)   STEP 1: Take 15 grams of carbohydrates when your blood sugar is low, which includes:  3-4 GLUCOSE TABS  OR 3-4 OZ OF JUICE OR REGULAR SODA OR ONE TUBE OF GLUCOSE GEL    STEP 2: RECHECK blood sugar in 15 MINUTES STEP 3: If your blood sugar is still low at the 15 minute recheck --> then, go back to STEP 1 and treat AGAIN with another 15 grams of carbohydrates.

## 2022-06-16 NOTE — Progress Notes (Signed)
Name: Tyler Patterson  MRN/ DOB: [id number], [birth year]   Age/ Sex: 72 y.o., male    PCP: Jilda Panda, MD   Reason for Endocrinology Evaluation: Type 2 Diabetes Mellitus     Date of Initial Endocrinology Visit: 06/13/2021    PATIENT IDENTIFIER: Tyler Patterson is a 72 y.o. male with a past medical history of T2DM and HTN, ESRD , Hx of alcohol abuse . The patient presented for initial endocrinology clinic visit on 06/13/2021 for consultative assistance with his diabetes management.    HPI:  Diagnosed with DM in 2011 Prior Medications tried/Intolerance: Novolog/Lantus stopped during the pandemic due to loss of transportation .             Hemoglobin A1c has ranged from 8.0% in 2022, peaking at 13.5% in 2018. Patient required assistance for hypoglycemia: yes - 2021   On his initial visit to our clinic his A1c was 11.0%, he was started on prandial insulin per correction scale due to fear of hypoglycemia    SUBJECTIVE:   During the last visit (10/13/2021): A1c 8.3%   Today (06/16/22): Tyler Patterson is here for follow-up on diabetes management . He checks his blood sugars multiple times daily, through CGM. Marland KitchenHe  had hypoglycemic episodes since the last clinic visit.  He is s/p right inguinal hernia repair 03/19/2022 He was evaluated for tremors in the ED 10/2021  He does drink regular sodas Denies nausea, vomiting  Denies diarrhea but has constipation    On HD :Monday, Wednesday and Friday   He does have occasional tingling of the feet   HOME DIABETES REGIMEN: Humalog per correction scale (BG -130/45)      Statin: yes ACE-I/ARB: yes   CONTINUOUS GLUCOSE MONITORING RECORD INTERPRETATION    Dates of Recording: 3/6- 06/16/2022  Sensor description:freestyle libre  Results statistics:   CGM use % of time 77  Average and SD 241/32.9  Time in range    24    %  % Time Above 180 34  % Time above 250 42  % Time Below target 0   Glycemic patterns summary: Hyperglycemia noted  during the day and night   Hyperglycemic episodes  worse during the day  Hypoglycemic episodes occurred post bolus  Overnight periods: trends down     DIABETIC COMPLICATIONS: Microvascular complications:  CKD IV, neuropathy Denies: retinopathy Last eye exam: Completed > 2 yrs ago Macrovascular complications:   Denies: CAD, PVD, CVA   PAST HISTORY: Past Medical History:  Past Medical History:  Diagnosis Date   Alcohol abuse    Anemia    Chronic kidney disease    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   COPD (chronic obstructive pulmonary disease) (HCC)    Depression    Diabetes mellitus    type 2   Headache 05/13/2013   Headache(784.0)    Hypertension    PONV (postoperative nausea and vomiting)    Spinal stenosis    04/25/21 MRI ([facility]): "Neurogenic claudication secondary to spinal stenosis at L3-4 moderate to severe. Lateral recess stenosis L5-S1 to the left. 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 10/24/21 CT   Past Surgical History:  Past Surgical History:  Procedure Laterality Date   AV FISTULA PLACEMENT Left  05/28/2021   Procedure: LEFT BRACHIOBASILIC ARTERIOVENOUS FISTULA CREATION;  Surgeon: [name], MD;  Location: [place];  Service: Vascular;  Laterality: Left;  PERIPHERAL NERVE BLOCK   BASCILIC VEIN TRANSPOSITION Left 07/28/2021   Procedure: SECOND STAGE BASILIC VEIN FISTULA;  Surgeon: [name], MD;  Location: [place];  Service: Vascular;  Laterality: Left;  PERIPHERAL NERVE BLOCK   COLON SURGERY     Hemicolectomy for benign colon mass   HERNIA REPAIR     INCISIONAL HERNIA REPAIR N/A 01/14/2021   Procedure: REPAIR OF INCISIONAL HERNIA WITH MESH;  Surgeon: Erroll Luna, MD;  Location: [place];  Service: General;  Laterality: N/A;   INGUINAL HERNIA REPAIR Right 03/19/2022   Procedure: RIGHT INGUINAL HERNIA REPAIR WITH MESH;  Surgeon: Erroll Luna, MD;  Location: [place];  Service: General;  Laterality: Right;   INSERTION OF MESH Right 03/19/2022   Procedure: INSERTION OF MESH;  Surgeon: Erroll Luna, MD;  Location: [place];  Service: General;  Laterality: Right;    Social History:  reports that he quit smoking about 15 months ago. His smoking use included cigars and cigarettes. He smoked an average of .25 packs per day. He has never used smokeless tobacco. He reports that he does not currently use alcohol. He reports that he does not use drugs. Family History:  Family History  Problem Relation Age of Onset   Lung cancer Mother    Lung cancer Father    Diabetes Mellitus II Neg Hx      HOME MEDICATIONS: Allergies as of 06/16/2022       Reactions   Duloxetine    Other reaction(s): Abdominal pain   Fiorinal [butalbital-aspirin-caffeine] Swelling   Spironolactone    Other reaction(s): Gynecomastia        Medication List        Accurate as of June 16, 2022  9:13 AM. If you have any questions, ask your nurse or doctor.          aspirin EC 81 MG tablet Take 81 mg by mouth daily. Swallow whole.   atorvastatin 40 MG tablet Commonly known as: LIPITOR Take 40 mg by mouth daily.   BD Pen Needle Nano U/F 32G X 4 MM Misc Generic drug: Insulin Pen Needle 1 Device by Does not apply route 3 (three) times daily.   Dialyvite 5000 5 MG Tabs Take 5 mg by mouth daily.   FreeStyle Libre 2 Sensor Misc 1 Device by Does not apply route every 14 (fourteen) days.   furosemide 80 MG tablet Commonly known as: LASIX Take 80 mg by mouth 2 (two) times daily.   insulin lispro 100 UNIT/ML KwikPen Commonly known as: HumaLOG KwikPen Max daily 20 units What changed:  how much to take when to take this additional instructions   OneTouch Verio test strip Generic drug:  glucose blood 1 each by Other route 3 (three) times daily. Use as instructed   oxyCODONE 5 MG immediate release tablet Commonly known as: Roxicodone Take 1 tablet (5 mg total) by mouth every 8 (eight) hours as needed.   vitamin D3 50 MCG (2000 UT) Caps Take 2,000 Units by mouth daily.         ALLERGIES: Allergies  Allergen Reactions   Duloxetine     Other reaction(s): Abdominal pain   Fiorinal [Butalbital-Aspirin-Caffeine] Swelling   Spironolactone     Other reaction(s): Gynecomastia     REVIEW OF SYSTEMS: A comprehensive ROS was conducted with the patient  and is negative except as per HPI     OBJECTIVE:   VITAL SIGNS: BP 136/80 (BP Location: Right Arm, Patient Position: Sitting, Cuff Size: Small)   Pulse 81   Ht 5\' 8"  (1.727 m)   Wt 145 lb (65.8 kg)   SpO2 97%   BMI 22.05 kg/m    PHYSICAL EXAM:  General: Pt appears well and is in NAD  Lungs: Clear with good BS bilat   Heart: RRR   Extremities: 1+ pretibial edema.   Neuro: MS is good with appropriate affect, pt is alert and Ox3   DM Foot Exam 06/16/2022   The skin of the feet is without sores or ulcerations, toe nails thickened, discolored The pedal pulses are undetectable  The sensation is intact to a screening 5.07, 10 gram monofilament bilaterally   DATA REVIEWED:  Lab Results  Component Value Date   HGBA1C 9.5 (A) 06/16/2022   HGBA1C 8.8 (H) 03/17/2022   HGBA1C 8.3 (A) 10/13/2021    Latest Reference Range & Units 03/19/22 06:09  Sodium 135 - 145 mmol/L 135  Potassium 3.5 - 5.1 mmol/L 4.9  Chloride 98 - 111 mmol/L 96 (L)  Glucose 70 - 99 mg/dL 216 (H)  BUN 8 - 23 mg/dL 43 (H)  Creatinine 0.61 - 1.24 mg/dL 6.70 (H)  Calcium Ionized 1.15 - 1.40 mmol/L 1.07 (L)    ASSESSMENT / PLAN / RECOMMENDATIONS:   1) Type 2 Diabetes Mellitus, poorly controlled, 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 Goal A1c <7.0%.     -Patient continues with hyperglycemia, on  downloading his CGM, the patient appears with inconsistent intake of Humalog with meals, he also tends to bolus after the meal which results in hypoglycemia -Patient with severe fear of hypoglycemia and had discontinued basal insulin in the past - Pt continues with sugar-sweetened beverages and has no intention of discontinuing this - We discussed the importance of optimizing glucose control despite being on HD to protect his vision and amputation  - limited oral glycemic agents due to ESRD, I did recommend Rybelsus, he denies prior pancreatitis, caution against GI side effects, he was provided with #30 Rybelsus 3 mg sample and a prescription of Rybelsus 7 mg has been sent to the pharmacy   MEDICATIONS: Continue Humalog (BG -130/45)  TIDQAC Start Rybelsus 3 mg daily for a month, then increase to 7 mg daily  EDUCATION / INSTRUCTIONS: BG monitoring instructions: Patient is instructed to check his blood sugars 3 times a day, before meals. Call [facility] if: BG persistently < 70  I reviewed the Rule of 15 for the treatment of hypoglycemia in detail with the patient. Literature supplied.   2) Diabetic complications:  Eye: Does not have known diabetic retinopathy.  Neuro/ Feet: Does not have known diabetic peripheral neuropathy. Renal: Patient does  have known baseline CKD. He is  on an ACEI/ARB at present.    Follow-up in 6 months   Signed electronically by: Mack Guise, MD  [facility] Endocrinology  [facility] [address] [place], [place] [address] Phone: [phone] FAX: [phone]   CC: Jilda Panda, MD [address] [place] [address] Phone: [phone]  Fax: [phone]    Return to Endocrinology clinic as below: Future Appointments  Date Time Provider [place]  06/16/2022 10:10 AM Kaleeya Hancock, Melanie Crazier, MD [facility] None

## 2022-09-29 ENCOUNTER — Other Ambulatory Visit: Payer: Self-pay

## 2022-09-29 ENCOUNTER — Other Ambulatory Visit: Payer: Self-pay | Admitting: Internal Medicine

## 2022-09-29 DIAGNOSIS — E1142 Type 2 diabetes mellitus with diabetic polyneuropathy: Secondary | ICD-10-CM

## 2022-09-29 MED ORDER — BD PEN NEEDLE NANO U/F 32G X 4 MM MISC: 1.0000 | Freq: Three times a day (TID) | 3 refills | Status: DC

## 2022-10-13 LAB — COMPREHENSIVE METABOLIC PANEL
Albumin: 4.6 (ref 3.5–5.0)
Calcium: 10.3 (ref 8.7–10.7)
Globulin: 2.7
eGFR: 14

## 2022-10-13 LAB — BASIC METABOLIC PANEL
BUN: 26 — AB (ref 4–21)
Chloride: 97 — AB (ref 99–108)
Creatinine: 4.3 — AB (ref 0.6–1.3)
Glucose: 114
Potassium: 5 mEq/L (ref 3.5–5.1)
Sodium: 141 (ref 137–147)

## 2022-10-13 LAB — LIPID PANEL
HDL: 82 — AB (ref 35–70)
LDL Cholesterol: 55
Triglycerides: 64 (ref 40–160)

## 2022-10-13 LAB — CBC AND DIFFERENTIAL
HCT: 36 — AB (ref 41–53)
Hemoglobin: 11.3 — AB (ref 13.5–17.5)
WBC: 4

## 2022-10-13 LAB — CBC: RBC: 3.1 — AB (ref 3.87–5.11)

## 2022-10-13 LAB — TSH: TSH: 1.67 (ref 0.41–5.90)

## 2022-10-13 LAB — HEMOGLOBIN A1C: Hemoglobin A1C: 6.4

## 2022-10-13 LAB — HEPATIC FUNCTION PANEL: Bilirubin, Total: 1

## 2022-10-16 ENCOUNTER — Encounter: Payer: Self-pay | Admitting: Internal Medicine

## 2022-12-22 ENCOUNTER — Other Ambulatory Visit: Payer: Self-pay | Admitting: Internal Medicine

## 2022-12-22 ENCOUNTER — Ambulatory Visit: Payer: 59 | Admitting: Internal Medicine

## 2022-12-22 ENCOUNTER — Encounter: Payer: Self-pay | Admitting: Internal Medicine

## 2022-12-22 VITALS — BP 130/72 | HR 68 | Ht 68.0 in | Wt 151.0 lb

## 2022-12-22 DIAGNOSIS — N186 End stage renal disease: Secondary | ICD-10-CM

## 2022-12-22 DIAGNOSIS — Z794 Long term (current) use of insulin: Secondary | ICD-10-CM | POA: Diagnosis not present

## 2022-12-22 DIAGNOSIS — E1165 Type 2 diabetes mellitus with hyperglycemia: Secondary | ICD-10-CM | POA: Diagnosis not present

## 2022-12-22 DIAGNOSIS — E1122 Type 2 diabetes mellitus with diabetic chronic kidney disease: Secondary | ICD-10-CM | POA: Diagnosis not present

## 2022-12-22 DIAGNOSIS — E1142 Type 2 diabetes mellitus with diabetic polyneuropathy: Secondary | ICD-10-CM | POA: Diagnosis not present

## 2022-12-22 DIAGNOSIS — Z992 Dependence on renal dialysis: Secondary | ICD-10-CM

## 2022-12-22 LAB — POCT GLYCOSYLATED HEMOGLOBIN (HGB A1C): Hemoglobin A1C: 7.9 % — AB (ref 4.0–5.6)

## 2022-12-22 MED ORDER — BD PEN NEEDLE NANO U/F 32G X 4 MM MISC
1.0000 | Freq: Four times a day (QID) | 3 refills | Status: DC
Start: 2022-12-22 — End: 2023-11-04

## 2022-12-22 MED ORDER — INSULIN LISPRO (1 UNIT DIAL) 100 UNIT/ML (KWIKPEN): PEN_INJECTOR | SUBCUTANEOUS | 3 refills | Status: DC

## 2022-12-22 MED ORDER — LANTUS SOLOSTAR 100 UNIT/ML ~~LOC~~ SOPN: 8.0000 [IU] | PEN_INJECTOR | Freq: Every day | SUBCUTANEOUS | 3 refills | Status: DC

## 2022-12-22 NOTE — Progress Notes (Signed)
Name: Tyler Patterson  MRN/ DOB: [id number], Oct 11, 1950   Age/ Sex: 72 y.o., male    PCP: Ralene Ok, MD   Reason for Endocrinology Evaluation: Type 2 Diabetes Mellitus     Date of Initial Endocrinology Visit: 06/13/2021    PATIENT IDENTIFIER: Tyler Patterson is a 72 y.o. male with a past medical history of T2DM and HTN, ESRD , Hx of alcohol abuse . The patient presented for initial endocrinology clinic visit on 06/13/2021 for consultative assistance with his diabetes management.    HPI:  Diagnosed with DM in 2011 Prior Medications tried/Intolerance: Novolog/Lantus stopped during the pandemic due to loss of transportation .             Hemoglobin A1c has ranged from 8.0% in 2022, peaking at 13.5% in 2018. Patient required assistance for hypoglycemia: yes - 2021   On his initial visit to our clinic his A1c was 11.0%, he was started on prandial insulin per correction scale due to fear of hypoglycemia   Started Rybelsus 05/2022 with an A1c of 9.5% but this was discontinued due to weight loss    SUBJECTIVE:   During the last visit (06/16/2022): A1c 9.5%     Today (12/22/22): Tyler Patterson is here for follow-up on diabetes management . He checks his blood sugars multiple times daily, through CGM. Marland KitchenHe  had hypoglycemic episodes since the last clinic visit.  Patient is undergoing pretransplant evaluation through [facility]  He stopped drinking sugar-sweetened beverage  Denies nausea, vomiting  Has noted diarrhea but no constipation     On HD :Monday, Wednesday and Friday   He does have occasional tingling of the feet   HOME DIABETES REGIMEN: Rybelsus 7 mg daily-not taking Humalog per correction scale (BG -130/45)      Statin: yes ACE-I/ARB: yes   CONTINUOUS GLUCOSE MONITORING RECORD INTERPRETATION    Dates of Recording:6/27-9/24/2024  Sensor description:freestyle libre  Results statistics:   CGM use % of time 79  Average and SD 168/42.9  Time in range  60 %   % Time Above 180 25  % Time above 250 13  % Time Below target 2   Glycemic patterns summary: BG's trend down overnight and fluctuate during the day  Hyperglycemic episodes postprandial  Hypoglycemic episodes occurred N/A  Overnight periods: Variable     DIABETIC COMPLICATIONS: Microvascular complications:  ESRD on HD, neuropathy Denies: retinopathy Last eye exam: Completed > 2 yrs ago Macrovascular complications:   Denies: CAD, PVD, CVA   PAST HISTORY: Past Medical History:  Past Medical History:  Diagnosis Date   Alcohol abuse    Anemia    Chronic kidney disease    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   COPD (chronic obstructive pulmonary disease) (HCC)    Depression    Diabetes mellitus    type 2   Headache 05/13/2013   Headache(784.0)    Hypertension    PONV (postoperative nausea and vomiting)    Spinal stenosis    04/25/21 MRI ([facility]): "Neurogenic claudication secondary to spinal stenosis at L3-4 moderate to severe. Lateral recess stenosis L5-S1 to the left. 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 10/24/21 CT   Past Surgical History:  Past Surgical History:  Procedure  Laterality Date   AV FISTULA PLACEMENT Left 05/28/2021   Procedure: LEFT BRACHIOBASILIC ARTERIOVENOUS FISTULA CREATION;  Surgeon: Leonie Douglas, MD;  [facility];  Service: Vascular;  Laterality: Left;  PERIPHERAL NERVE BLOCK   BASCILIC VEIN TRANSPOSITION Left 07/28/2021   Procedure: SECOND STAGE BASILIC VEIN FISTULA;  Surgeon: Leonie Douglas, MD;  [facility];  Service: Vascular;  Laterality: Left;  PERIPHERAL NERVE BLOCK   COLON SURGERY     Hemicolectomy for benign colon mass   HERNIA REPAIR     INCISIONAL HERNIA REPAIR N/A 01/14/2021    Procedure: REPAIR OF INCISIONAL HERNIA WITH MESH;  Surgeon: Harriette Bouillon, MD;  [facility];  Service: General;  Laterality: N/A;   INGUINAL HERNIA REPAIR Right 03/19/2022   Procedure: RIGHT INGUINAL HERNIA REPAIR WITH MESH;  Surgeon: Harriette Bouillon, MD;  [facility];  Service: General;  Laterality: Right;   INSERTION OF MESH Right 03/19/2022   Procedure: INSERTION OF MESH;  Surgeon: Harriette Bouillon, MD;  [facility];  Service: General;  Laterality: Right;    Social History:  reports that he quit smoking about 22 months ago. His smoking use included cigars and cigarettes. He has never used smokeless tobacco. He reports that he does not currently use alcohol. He reports that he does not use drugs. Family History:  Family History  Problem Relation Age of Onset   Lung cancer Mother    Lung cancer Father    Diabetes Mellitus II Neg Hx      HOME MEDICATIONS: Allergies as of 12/22/2022       Reactions   Duloxetine    Other reaction(s): Abdominal pain   Fiorinal [butalbital-aspirin-caffeine] Swelling   Spironolactone    Other reaction(s): Gynecomastia        Medication List        Accurate as of December 22, 2022  9:26 AM. If you have any questions, ask your nurse or doctor.          aspirin EC 81 MG tablet Take 81 mg by mouth daily. Swallow whole.   atorvastatin 40 MG tablet Commonly known as: LIPITOR Take 40 mg by mouth daily.   BD Pen Needle Nano U/F 32G X 4 MM Misc Generic drug: Insulin Pen Needle 1 Device by Does not apply route 3 (three) times daily.   Dialyvite 5000 5 MG Tabs Take 5 mg by mouth daily.   Dialyvite 800 0.8 MG Tabs Take by mouth.   FreeStyle Libre 2 Sensor Misc 1 Device by Does not apply route every 14 (fourteen) days.   furosemide 80 MG tablet Commonly known as: LASIX Take 80 mg by mouth 2 (two) times daily.   HECTOROL IV Doxercalciferol (Hectorol)   insulin lispro 100 UNIT/ML KwikPen Commonly  known as: HumaLOG KwikPen MAX DAILY 20 UNITS   iron sucrose in sodium chloride 0.9 % 100 mL Iron Sucrose (Venofer)   lidocaine 5 % Commonly known as: LIDODERM Place onto the skin.   MIRCERA IJ Mircera   OneTouch Verio test strip Generic drug: glucose blood 1 each by Other route 3 (three) times daily. Use as instructed   oxyCODONE 5 MG immediate release tablet Commonly known as: Roxicodone Take 1 tablet (5 mg total) by mouth every 8 (eight) hours as needed.   oxyCODONE-acetaminophen 5-325 MG tablet Commonly known as: PERCOCET/ROXICET Take 1 tablet by mouth every 6 (six) hours as needed.   Rybelsus 7 MG Tabs Generic drug: Semaglutide Take 1 tablet (7 mg  total) by mouth daily.   vitamin D3 50 MCG (2000 UT) Caps Take 2,000 Units by mouth daily.         ALLERGIES: Allergies  Allergen Reactions   Duloxetine     Other reaction(s): Abdominal pain   Fiorinal [Butalbital-Aspirin-Caffeine] Swelling   Spironolactone     Other reaction(s): Gynecomastia     REVIEW OF SYSTEMS: A comprehensive ROS was conducted with the patient and is negative except as per HPI     OBJECTIVE:   VITAL SIGNS: BP 130/72 (BP Location: Left Arm, Patient Position: Sitting, Cuff Size: Small)   Pulse 68   Ht 5\' 8"  (1.727 m)   Wt 151 lb (68.5 kg)   SpO2 99%   BMI 22.96 kg/m    PHYSICAL EXAM:  General: Pt appears well and is in NAD  Lungs: Clear with good BS bilat   Heart: RRR   Extremities: Trace  pretibial edema.   Neuro: MS is good with appropriate affect, pt is alert and Ox3   DM Foot Exam 06/16/2022   The skin of the feet is without sores or ulcerations, toe nails thickened, discolored The pedal pulses are undetectable  The sensation is intact to a screening 5.07, 10 gram monofilament bilaterally   DATA REVIEWED:  Lab Results  Component Value Date   HGBA1C 7.9 (A) 12/22/2022   HGBA1C 6.4 10/13/2022   HGBA1C 9.5 (A) 06/16/2022    Latest Reference Range & Units 10/13/22  00:00  BASIC METABOLIC PANEL  Rpt ! (E)  COMPREHENSIVE METABOLIC PANEL  Rpt (E)  Sodium 137 - 147  141 (E)  Potassium 3.5 - 5.1 mEq/L 5.0 (E)  Chloride 99 - 108  97 ! (E)  Glucose  114 (E)  BUN 4 - 21  26 ! (E)  Creatinine 0.6 - 1.3  4.3 ! 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 Goal A1c <7.0%.     -His A1c had improved to 6.4% on Rybelsus, but unfortunately this was discontinued by another provider due to weight loss in July 2024, since then his A1c has been trending up again - limited oral glycemic agents due to ESRD -I have recommended starting basal insulin as below, while continuing to use Humalog before each meal  MEDICATIONS: Continue Humalog (BG -130/45)  TIDQAC Start Lantus 8 units daily   EDUCATION / INSTRUCTIONS: BG monitoring instructions: Patient is instructed to check his blood sugars 3 times a day, before meals. Call [place] [facility] [facility] if: BG persistently < 70  I reviewed the Rule of 15 for the treatment of hypoglycemia in detail with the patient. Literature supplied.   2) Diabetic complications:  Eye: Does not have known diabetic retinopathy.  Neuro/ Feet: Does not have known diabetic peripheral neuropathy. Renal: Patient does  have known baseline CKD. He is  on an ACEI/ARB at present.    Follow-up in 6 months   Signed electronically by: Lyndle Herrlich, MD  [facility] Endocrinology  [facility] [address] [place]  [address] [place], [place] [address] Phone: [phone] FAX: [phone]   CC: Ralene Ok, MD [address] [place]  [address] Phone: [phone]  Fax: [phone]    Return to Endocrinology clinic as below: Future Appointments  Date Time Provider Department Center  12/22/2022  9:30 AM Cord Wilczynski, Konrad Dolores, MD [facility] None

## 2022-12-22 NOTE — Patient Instructions (Addendum)
Start Lantus 8 units once daily Humalog correctional insulin: Use the scale below to help guide you BEFORE  Breakfast, Lunch and Dinner time   Blood sugar before meal Number of units to inject  Less than 175 0 unit  176 -  220 1 units  221 -  265 2 units  266 -  310 3 units  311 -  355 4 units  356 -  400 5 units  401 -  445 6 units   HOW TO TREAT LOW BLOOD SUGARS (Blood sugar LESS THAN 70 MG/DL) Please follow the RULE OF 15 for the treatment of hypoglycemia treatment (when your (blood sugars are less than 70 mg/dL)   STEP 1: Take 15 grams of carbohydrates when your blood sugar is low, which includes:  3-4 GLUCOSE TABS  OR 3-4 OZ OF JUICE OR REGULAR SODA OR ONE TUBE OF GLUCOSE GEL    STEP 2: RECHECK blood sugar in 15 MINUTES STEP 3: If your blood sugar is still low at the 15 minute recheck --> then, go back to STEP 1 and treat AGAIN with another 15 grams of carbohydrates.

## 2022-12-23 ENCOUNTER — Encounter: Payer: Self-pay | Admitting: Internal Medicine

## 2023-02-09 ENCOUNTER — Other Ambulatory Visit: Payer: Self-pay

## 2023-04-30 ENCOUNTER — Other Ambulatory Visit: Payer: Self-pay

## 2023-04-30 MED ORDER — FREESTYLE LIBRE 2 SENSOR MISC: 1.0000 | 3 refills | Status: DC

## 2023-06-21 ENCOUNTER — Ambulatory Visit: Payer: 59 | Admitting: Internal Medicine

## 2023-08-03 ENCOUNTER — Encounter: Payer: Self-pay | Admitting: Internal Medicine

## 2023-08-03 ENCOUNTER — Ambulatory Visit (INDEPENDENT_AMBULATORY_CARE_PROVIDER_SITE_OTHER): Admitting: Internal Medicine

## 2023-08-03 VITALS — BP 128/80 | HR 74 | Ht 68.0 in | Wt 151.0 lb

## 2023-08-03 DIAGNOSIS — Z794 Long term (current) use of insulin: Secondary | ICD-10-CM | POA: Diagnosis not present

## 2023-08-03 DIAGNOSIS — E1165 Type 2 diabetes mellitus with hyperglycemia: Secondary | ICD-10-CM | POA: Diagnosis not present

## 2023-08-03 DIAGNOSIS — E1142 Type 2 diabetes mellitus with diabetic polyneuropathy: Secondary | ICD-10-CM

## 2023-08-03 DIAGNOSIS — E1122 Type 2 diabetes mellitus with diabetic chronic kidney disease: Secondary | ICD-10-CM | POA: Diagnosis not present

## 2023-08-03 DIAGNOSIS — N186 End stage renal disease: Secondary | ICD-10-CM

## 2023-08-03 DIAGNOSIS — Z992 Dependence on renal dialysis: Secondary | ICD-10-CM

## 2023-08-03 LAB — POCT GLYCOSYLATED HEMOGLOBIN (HGB A1C): Hemoglobin A1C: 8.5 % — AB (ref 4.0–5.6)

## 2023-08-03 LAB — POCT GLUCOSE (DEVICE FOR HOME USE): Glucose Fasting, POC: 131 mg/dL — AB (ref 70–99)

## 2023-08-03 NOTE — Patient Instructions (Addendum)
 Continue Lantus  3 units daily  Take Humalog  4 units with each meal and 2 units with a snack  Humalog  correctional insulin : Use the scale below to help guide you BEFORE  Breakfast, Lunch and Dinner time   Blood sugar before meal Number of units to inject  Less than 175 0 unit  176 -  220 1 units  221 -  265 2 units  266 -  310 3 units  311 -  355 4 units  356 -  400 5 units  401 -  445 6 units   HOW TO TREAT LOW BLOOD SUGARS (Blood sugar LESS THAN 70 MG/DL) Please follow the RULE OF 15 for the treatment of hypoglycemia treatment (when your (blood sugars are less than 70 mg/dL)   STEP 1: Take 15 grams of carbohydrates when your blood sugar is low, which includes:  3-4 GLUCOSE TABS  OR 3-4 OZ OF JUICE OR REGULAR SODA OR ONE TUBE OF GLUCOSE GEL    STEP 2: RECHECK blood sugar in 15 MINUTES STEP 3: If your blood sugar is still low at the 15 minute recheck --> then, go back to STEP 1 and treat AGAIN with another 15 grams of carbohydrates.

## 2023-08-03 NOTE — Progress Notes (Signed)
 Name: Tyler Patterson  MRN/ DOB: [id number], Nov 17, 1950   Age/ Sex: 73 y.o., male    PCP: Edda Goo, MD   Reason for Endocrinology Evaluation: Type 2 Diabetes Mellitus     Date of Initial Endocrinology Visit: 06/13/2021    PATIENT IDENTIFIER: [name] is a 73 y.o. male with a past medical history of T2DM and HTN, ESRD , Hx of alcohol abuse . The patient presented for initial endocrinology clinic visit on 06/13/2021 for consultative assistance with his diabetes management.    HPI:  Diagnosed with DM in 2011 Prior Medications tried/Intolerance: Novolog /Lantus  stopped during the pandemic due to loss of transportation .             Hemoglobin A1c has ranged from 8.0% in 2022, peaking at 13.5% in 2018. Patient required assistance for hypoglycemia: yes - 2021   On his initial visit to our clinic his A1c was 11.0%, he was started on prandial insulin  per correction scale due to fear of hypoglycemia   Started Rybelsus  05/2022 with an A1c of 9.5% but this was discontinued due to weight loss    SUBJECTIVE:   During the last visit (12/22/2022): A1c 7.9%     Today (08/03/23): Tyler Patterson is here for follow-up on diabetes management . He checks his blood sugars multiple times daily, through freestyle libre.  Patient forgot his receiver at home, unable to download data.   On HD :Monday, Wednesday and Friday   Denies nausea or vomiting  Denies constipation or diarrhea   Eats 1 meal a day, continues with occasional sugar-sweetened beverages   HOME DIABETES REGIMEN: Humalog  per correction scale (BG -130/45) Lantus  8 units daily-takes 3 units     Statin: yes ACE-I/ARB: yes   CONTINUOUS GLUCOSE MONITORING RECORD INTERPRETATION : n/a     DIABETIC COMPLICATIONS: Microvascular complications:  ESRD on HD, neuropathy Denies: retinopathy Last eye exam: Completed 04/13/2023 Macrovascular complications:   Denies: CAD, PVD, CVA   PAST HISTORY: Past Medical History:  Past  Medical History:  Diagnosis Date   Alcohol abuse    Anemia    Chronic kidney disease    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   COPD (chronic obstructive pulmonary disease) (HCC)    Depression    Diabetes mellitus    type 2   Headache 05/13/2013   Headache(784.0)    Hypertension    PONV (postoperative nausea and vomiting)    Spinal stenosis    04/25/21 MRI ([facility]): "Neurogenic claudication secondary to spinal stenosis at L3-4 moderate to severe. Lateral recess stenosis L5-S1 to the left. 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 10/24/21 CT   Past Surgical History:  Past Surgical History:  Procedure Laterality Date   AV FISTULA PLACEMENT Left 05/28/2021   Procedure: LEFT BRACHIOBASILIC ARTERIOVENOUS FISTULA CREATION;  Surgeon: Carlene Che, MD;  [facility];  Service: Vascular;  Laterality: Left;  PERIPHERAL NERVE BLOCK   BASCILIC VEIN TRANSPOSITION Left 07/28/2021   Procedure: SECOND STAGE BASILIC VEIN FISTULA;  Surgeon: Carlene Che, MD;  [facility]: [facility] OR;  Service: Vascular;  Laterality: Left;  PERIPHERAL NERVE BLOCK   COLON SURGERY     Hemicolectomy for benign colon mass   HERNIA REPAIR  INCISIONAL HERNIA REPAIR N/A 01/14/2021   Procedure: REPAIR OF INCISIONAL HERNIA WITH MESH;  Surgeon: Sim Dryer, MD;  Location: [facility];  Service: General;  Laterality: N/A;   INGUINAL HERNIA REPAIR Right 03/19/2022   Procedure: RIGHT INGUINAL HERNIA REPAIR WITH MESH;  Surgeon: Sim Dryer, MD;  [facility];  Service: General;  Laterality: Right;   INSERTION OF MESH Right 03/19/2022   Procedure: INSERTION OF MESH;  Surgeon: Sim Dryer, MD;  [facility];  Service: General;  Laterality: Right;    Social  History:  reports that he quit smoking about 2 years ago. His smoking use included cigars and cigarettes. He has never used smokeless tobacco. He reports that he does not currently use alcohol. He reports that he does not use drugs. Family History:  Family History  Problem Relation Age of Onset   Lung cancer Mother    Lung cancer Father    Diabetes Mellitus II Neg Hx      HOME MEDICATIONS: Allergies as of 08/03/2023       Reactions   Duloxetine     Other reaction(s): Abdominal pain   Fiorinal [butalbital-aspirin -caffeine] Swelling   Spironolactone     Other reaction(s): Gynecomastia        Medication List        Accurate as of Aug 03, 2023  9:02 AM. If you have any questions, ask your nurse or doctor.          aspirin  EC 81 MG tablet Take 81 mg by mouth daily. Swallow whole.   atorvastatin 40 MG tablet Commonly known as: LIPITOR Take 40 mg by mouth daily.   BD Pen Needle Nano U/F 32G X 4 MM Misc Generic drug: Insulin  Pen Needle 1 Device by Does not apply route in the morning, at noon, in the evening, and at bedtime.   Dialyvite 5000 5 MG Tabs Take 5 mg by mouth daily.   Dialyvite 800 0.8 MG Tabs Take by mouth.   FreeStyle Libre 2 Sensor Misc 1 Device by Does not apply route every 14 (fourteen) days.   furosemide  80 MG tablet Commonly known as: LASIX  Take 80 mg by mouth 2 (two) times daily.   HECTOROL IV Doxercalciferol (Hectorol)   insulin  lispro 100 UNIT/ML KwikPen Commonly known as: HUMALOG  MAX DAILY 20 UNITS   iron sucrose in sodium chloride  0.9 % 100 mL Iron Sucrose (Venofer)   Lantus  SoloStar 100 UNIT/ML Solostar Pen Generic drug: insulin  glargine Inject 8 Units into the skin daily. What changed: how much to take   lidocaine  5 % Commonly known as: LIDODERM  Place onto the skin.   MIRCERA IJ Mircera   OneTouch Verio test strip Generic drug: glucose blood 1 each by Other route 3 (three) times daily. Use as instructed    oxyCODONE -acetaminophen  5-325 MG tablet Commonly known as: PERCOCET/ROXICET Take 1 tablet by mouth every 6 (six) hours as needed.   Rybelsus  7 MG Tabs Generic drug: Semaglutide  Take 1 tablet (7 mg total) by mouth daily.   Semaglutide  3 MG Tabs Take 2 tablets by mouth daily.   vitamin D3 50 MCG (2000 UT) Caps Take 2,000 Units by mouth daily.         ALLERGIES: Allergies  Allergen Reactions   Duloxetine      Other reaction(s): Abdominal pain   Fiorinal [Butalbital-Aspirin -Caffeine] Swelling   Spironolactone      Other reaction(s): Gynecomastia     REVIEW OF SYSTEMS: A comprehensive ROS was conducted with the patient and is negative  except as per HPI     OBJECTIVE:   VITAL SIGNS: BP 128/80 (BP Location: Right Arm, Patient Position: Sitting, Cuff Size: Normal)   Pulse 74   Ht 5\' 8"  (1.727 m)   Wt 151 lb (68.5 kg)   SpO2 99%   BMI 22.96 kg/m    PHYSICAL EXAM:  General: Pt appears well and is in NAD  Lungs: Clear with good BS bilat   Heart: RRR   Extremities: Trace  pretibial edema.   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 Goal A1c <7.0%.    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, mining, no data to review -His fasting BG is 131 Mg/DL today, patient is on small amount of Lantus , less than previously prescribed, due to lack of data I would not change his Lantus   dose -I do suspect that his elevated A1c is due to postprandial hyperglycemia -Patient will be provided with a standing dose of Humalog  with meals/snacks -Rybelsus  was discontinued by another provider due to weight loss in July 2024, - limited oral glycemic agents due to ESRD  MEDICATIONS: Continue Lantus  3 units a day Take Humalog  4 units with a meal and 2 units with a snack Continue Humalog  (BG -130/45)  TIDQAC    EDUCATION / INSTRUCTIONS: BG monitoring instructions: Patient is instructed to check his blood sugars 3 times a day, before meals. Call [facility] if: BG persistently < 70  I reviewed the Rule of 15 for the treatment of hypoglycemia in detail with the patient. Literature supplied.   2) Diabetic complications:  Eye: Does not have known diabetic retinopathy.  Neuro/ Feet: Does not have known diabetic peripheral neuropathy. Renal: Patient does  have known baseline CKD. He is  on an ACEI/ARB at present.    Follow-up in 3 months   Signed electronically by: Natale Bail, MD  [facility] Endocrinology  [facility] [address] [place], [place] [address] Phone: [phone] FAX: [phone]   CC: Edda Goo, MD [address] [place] [address] Phone: [phone]  Fax: [phone]    Return to Endocrinology clinic as below: Future Appointments  Date Time Provider Department Center  08/03/2023  9:30 AM Donta Fuster, Julian Obey, MD [facility] None

## 2023-11-04 ENCOUNTER — Encounter: Payer: Self-pay | Admitting: Internal Medicine

## 2023-11-04 ENCOUNTER — Ambulatory Visit (INDEPENDENT_AMBULATORY_CARE_PROVIDER_SITE_OTHER): Admitting: Internal Medicine

## 2023-11-04 VITALS — BP 136/88 | HR 89 | Ht 68.0 in | Wt 142.0 lb

## 2023-11-04 DIAGNOSIS — E1165 Type 2 diabetes mellitus with hyperglycemia: Secondary | ICD-10-CM

## 2023-11-04 DIAGNOSIS — E1142 Type 2 diabetes mellitus with diabetic polyneuropathy: Secondary | ICD-10-CM

## 2023-11-04 DIAGNOSIS — Z794 Long term (current) use of insulin: Secondary | ICD-10-CM | POA: Diagnosis not present

## 2023-11-04 LAB — POCT GLYCOSYLATED HEMOGLOBIN (HGB A1C): Hemoglobin A1C: 9.4 % — AB (ref 4.0–5.6)

## 2023-11-04 MED ORDER — FREESTYLE LIBRE 3 READER DEVI: 1.0000 | Freq: Every day | 3 refills | Status: DC

## 2023-11-04 MED ORDER — LANTUS SOLOSTAR 100 UNIT/ML ~~LOC~~ SOPN: 5.0000 [IU] | PEN_INJECTOR | Freq: Every day | SUBCUTANEOUS | 3 refills | Status: DC

## 2023-11-04 MED ORDER — BD PEN NEEDLE NANO U/F 32G X 4 MM MISC
1.0000 | Freq: Four times a day (QID) | 3 refills | Status: DC
Start: 2023-11-04 — End: 2024-02-17

## 2023-11-04 MED ORDER — INSULIN LISPRO (1 UNIT DIAL) 100 UNIT/ML (KWIKPEN): 3.0000 [IU] | PEN_INJECTOR | Freq: Three times a day (TID) | SUBCUTANEOUS | 3 refills | Status: DC

## 2023-11-04 MED ORDER — FREESTYLE LIBRE 3 PLUS SENSOR MISC: 1.0000 | 3 refills | Status: DC

## 2023-11-04 NOTE — Progress Notes (Signed)
 Name: Tyler Patterson  MRN/ DOB: [id number], 11-Apr-1950   Age/ Sex: 73 y.o., male    PCP: Valma Carwin, MD   Reason for Endocrinology Evaluation: Type 2 Diabetes Mellitus     Date of Initial Endocrinology Visit: 06/13/2021    PATIENT IDENTIFIER: [name] is a 73 y.o. male with a past medical history of T2DM and HTN, ESRD , Hx of alcohol abuse . The patient presented for initial endocrinology clinic visit on 06/13/2021 for consultative assistance with his diabetes management.    HPI:  Diagnosed with DM in 2011 Prior Medications tried/Intolerance: Novolog /Lantus  stopped during the pandemic due to loss of transportation .             Hemoglobin A1c has ranged from 8.0% in 2022, peaking at 13.5% in 2018. Patient required assistance for hypoglycemia: yes - 2021   On his initial visit to our clinic his A1c was 11.0%, he was started on prandial insulin  per correction scale due to fear of hypoglycemia   Started Rybelsus  05/2022 with an A1c of 9.5% but this was discontinued due to weight loss    SUBJECTIVE:   During the last visit (08/03/2023): A1c 8.5%     Today (11/04/23): [name] is here for follow-up on diabetes management . He checks his blood sugars multiple times daily, through freestyle libre.  Patient forgot his receiver at home, unable to download data.   On HD :Monday, Wednesday and Friday -follows with [facility]  Denies nausea or vomiting  Denies constipation but has occasional diarrhea   Eats 1 meal a day, continues with occasional sugar-sweetened beverages and snacks    HOME DIABETES REGIMEN: Humalog  4 units with a meal, 2 units with a snack Humalog  per correction scale (BG -130/45) Lantus  3 units daily   Statin: yes ACE-I/ARB: yes   CONTINUOUS GLUCOSE MONITORING RECORD INTERPRETATION :unable  To download  Range 87-352 mg/dL  DIABETIC COMPLICATIONS: Microvascular complications:  ESRD on HD, neuropathy Denies: retinopathy Last  eye exam: Completed 04/13/2023 Macrovascular complications:   Denies: CAD, PVD, CVA   PAST HISTORY: Past Medical History:  Past Medical History:  Diagnosis Date   Alcohol abuse    Anemia    Chronic kidney disease    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   COPD (chronic obstructive pulmonary disease) (HCC)    Depression    Diabetes mellitus    type 2   Headache 05/13/2013   Headache(784.0)    Hypertension    PONV (postoperative nausea and vomiting)    Spinal stenosis    04/25/21 MRI ([facility]): Neurogenic claudication secondary to spinal stenosis at L3-4 moderate to severe. Lateral recess stenosis L5-S1 to the left. Moderate stenosis at L4-5 with transitional anatomy   Stroke [facility])    chronic right cerebellar infarct & lacunar infarcts within thalamus and bilateral basal ganglia 10/24/21 CT   Past Surgical History:  Past Surgical History:  Procedure Laterality Date   AV FISTULA PLACEMENT Left 05/28/2021   Procedure: LEFT BRACHIOBASILIC ARTERIOVENOUS FISTULA CREATION;  Surgeon: Magda Debby SAILOR, MD;  Location: [facility] OR;  Service: Vascular;  Laterality: Left;  PERIPHERAL NERVE BLOCK   BASCILIC VEIN TRANSPOSITION Left 07/28/2021   Procedure: SECOND STAGE BASILIC VEIN FISTULA;  Surgeon: Magda Debby SAILOR, MD;  [facility];  Service: Vascular;  Laterality: Left;  PERIPHERAL NERVE BLOCK  COLON SURGERY     Hemicolectomy for benign colon mass   HERNIA REPAIR     INCISIONAL HERNIA REPAIR N/A 01/14/2021   Procedure: REPAIR OF INCISIONAL HERNIA WITH MESH;  Surgeon: Vanderbilt Ned, MD;  Location: [facility];  Service: General;  Laterality: N/A;   INGUINAL HERNIA REPAIR Right 03/19/2022   Procedure: RIGHT INGUINAL HERNIA REPAIR WITH MESH;  Surgeon: Vanderbilt Ned, MD;  [facility];  Service: General;  Laterality: Right;   INSERTION OF MESH Right  03/19/2022   Procedure: INSERTION OF MESH;  Surgeon: Vanderbilt Ned, MD;  [facility];  Service: General;  Laterality: Right;    Social History:  reports that he quit smoking about 2 years ago. His smoking use included cigars and cigarettes. He has never used smokeless tobacco. He reports that he does not currently use alcohol. He reports that he does not use drugs. Family History:  Family History  Problem Relation Age of Onset   Lung cancer Mother    Lung cancer Father    Diabetes Mellitus II Neg Hx      HOME MEDICATIONS: Allergies as of 11/04/2023       Reactions   Duloxetine     Other reaction(s): Abdominal pain   Fiorinal [butalbital-aspirin -caffeine] Swelling   Spironolactone     Other reaction(s): Gynecomastia        Medication List        Accurate as of November 04, 2023  8:56 AM. If you have any questions, ask your nurse or doctor.          aspirin  EC 81 MG tablet Take 81 mg by mouth daily. Swallow whole.   atorvastatin 40 MG tablet Commonly known as: LIPITOR Take 40 mg by mouth daily.   BD Pen Needle Nano U/F 32G X 4 MM Misc Generic drug: Insulin  Pen Needle 1 Device by Does not apply route in the morning, at noon, in the evening, and at bedtime.   Dialyvite 5000 5 MG Tabs Take 5 mg by mouth daily.   Dialyvite 800 0.8 MG Tabs Take by mouth.   FreeStyle Libre 2 Sensor Misc 1 Device by Does not apply route every 14 (fourteen) days.   furosemide  80 MG tablet Commonly known as: LASIX  Take 80 mg by mouth 2 (two) times daily.   insulin  lispro 100 UNIT/ML KwikPen Commonly known as: HUMALOG  MAX DAILY 20 UNITS   iron sucrose in sodium chloride  0.9 % 100 mL Iron Sucrose (Venofer)   Lantus  SoloStar 100 UNIT/ML Solostar Pen Generic drug: insulin  glargine Inject 8 Units into the skin daily. What changed: how much to take   lidocaine  5 % Commonly known as: LIDODERM  Place onto the skin.   OneTouch Verio test strip Generic drug: glucose blood 1 each  by Other route 3 (three) times daily. Use as instructed   oxyCODONE -acetaminophen  5-325 MG tablet Commonly known as: PERCOCET/ROXICET Take 1 tablet by mouth every 6 (six) hours as needed.   vitamin D3 50 MCG (2000 UT) Caps Take 2,000 Units by mouth daily.         ALLERGIES: Allergies  Allergen Reactions   Duloxetine      Other reaction(s): Abdominal pain   Fiorinal [Butalbital-Aspirin -Caffeine] Swelling   Spironolactone      Other reaction(s): Gynecomastia     REVIEW OF SYSTEMS: A comprehensive ROS was conducted with the patient and is negative except as per HPI     OBJECTIVE:   VITAL SIGNS: BP 136/88 (BP Location: Right Arm, Patient Position: Sitting,  Cuff Size: Normal)   Pulse 89   Ht 5' 8 (1.727 m)   Wt 142 lb (64.4 kg)   SpO2 99%   BMI 21.59 kg/m    PHYSICAL EXAM:  General: Pt appears well and is in NAD  Lungs: Clear with good BS bilat   Heart: RRR   Neuro: MS is good with appropriate affect, pt is alert and Ox3   DM Foot Exam 05/13/2023 per podiatry    DATA REVIEWED:  Lab Results  Component Value Date   HGBA1C 8.5 (A) 08/03/2023   HGBA1C 7.9 (A) 12/22/2022   HGBA1C 6.4 10/13/2022    Labs through the [place] [facility] 03/16/2023  Calcium  10.1 Glucose 230 mg/DL Potassium 3.7 GFR 12 Creatinine 4.71  HDL 82.6 Triglycerides 83 LDL 58 A1c 10.4%    In office BG 131 Mg/DL   ASSESSMENT / PLAN / RECOMMENDATIONS:   1) Type 2 Diabetes Mellitus, poorly controlled, With CKD 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 Goal A1c <7.0%.     -Patient continues with worsening glycemic control -The patient takes insulin  haphazardly.  For example this morning he took Humalog  without eating breakfast.  He also does not take Lantus  on daily basis -Patient does not use correction scale -I have upgraded his freestyle libre from 2 to 3 plus -A referral has been placed to our CDE for further discussion and education about prandial/basal  insulin  intake -Rybelsus  was discontinued by another provider due to weight loss in July 2024, - limited oral glycemic agents due to ESRD - I again emphasized the importance of proper intake of insulin  to prevent further microvascular complications such as amputation and vision loss - Patient advised again that he should take Lantus  daily regardless of fasting status - Patient was advised that he should take Humalog  only if he is getting ready to eat a meal and should hold it if no meal  MEDICATIONS: Increase  Lantus  5 units a day Take Humalog  3 units with a meal     EDUCATION / INSTRUCTIONS: BG monitoring instructions: Patient is instructed to check his blood sugars 3 times a day, before meals. Call [facility] if: BG persistently < 70  I reviewed the Rule of 15 for the treatment of hypoglycemia in detail with the patient. Literature supplied.   2) Diabetic complications:  Eye: Does not have known diabetic retinopathy.  Neuro/ Feet: Does not have known diabetic peripheral neuropathy. Renal: Patient does  have known baseline CKD. He is  on an ACEI/ARB at present.    Follow-up in 3 months  I spent 25 minutes preparing to see the patient by review of recent labs, imaging and procedures, obtaining and reviewing separately obtained history, communicating with the patient, ordering medications, tests or procedures, and documenting clinical information in the EHR including the differential Dx, treatment, and any further evaluation and other management    Signed electronically by: Stefano Redgie Butts, MD  [facility] Endocrinology  [facility] [address] [place] [place], [place] [address] Phone: [phone] FAX: [phone]   CC: Valma Carwin, MD [address] [place] [address] Phone: [phone]  Fax: [phone]    Return to Endocrinology clinic as below: Future Appointments  Date Time Provider Department Center  11/04/2023   9:10 AM Kami Kube, Donell Redgie, MD [facility] None

## 2023-11-04 NOTE — Patient Instructions (Addendum)
 Continue Lantus  5 units daily  Take Humalog  3 units with meals only    HOW TO TREAT LOW BLOOD SUGARS (Blood sugar LESS THAN 70 MG/DL) Please follow the RULE OF 15 for the treatment of hypoglycemia treatment (when your (blood sugars are less than 70 mg/dL)   STEP 1: Take 15 grams of carbohydrates when your blood sugar is low, which includes:  3-4 GLUCOSE TABS  OR 3-4 OZ OF JUICE OR REGULAR SODA OR ONE TUBE OF GLUCOSE GEL    STEP 2: RECHECK blood sugar in 15 MINUTES STEP 3: If your blood sugar is still low at the 15 minute recheck --> then, go back to STEP 1 and treat AGAIN with another 15 grams of carbohydrates.

## 2023-11-16 ENCOUNTER — Ambulatory Visit: Admitting: Nutrition

## 2024-02-10 ENCOUNTER — Ambulatory Visit: Admitting: Internal Medicine

## 2024-02-15 ENCOUNTER — Encounter: Payer: Self-pay | Admitting: Physician Assistant

## 2024-02-15 ENCOUNTER — Ambulatory Visit (INDEPENDENT_AMBULATORY_CARE_PROVIDER_SITE_OTHER): Admitting: Physician Assistant

## 2024-02-15 ENCOUNTER — Other Ambulatory Visit (INDEPENDENT_AMBULATORY_CARE_PROVIDER_SITE_OTHER)

## 2024-02-15 DIAGNOSIS — M5442 Lumbago with sciatica, left side: Secondary | ICD-10-CM

## 2024-02-15 DIAGNOSIS — G8929 Other chronic pain: Secondary | ICD-10-CM | POA: Diagnosis not present
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
## 2024-02-17 ENCOUNTER — Ambulatory Visit (INDEPENDENT_AMBULATORY_CARE_PROVIDER_SITE_OTHER): Admitting: Internal Medicine

## 2024-02-17 ENCOUNTER — Encounter: Payer: Self-pay | Admitting: Internal Medicine

## 2024-02-17 VITALS — BP 144/58 | HR 66 | Resp 18 | Ht 68.0 in | Wt 146.0 lb

## 2024-02-17 DIAGNOSIS — Z794 Long term (current) use of insulin: Secondary | ICD-10-CM | POA: Diagnosis not present

## 2024-02-17 DIAGNOSIS — E1165 Type 2 diabetes mellitus with hyperglycemia: Secondary | ICD-10-CM | POA: Diagnosis not present

## 2024-02-17 DIAGNOSIS — N186 End stage renal disease: Secondary | ICD-10-CM | POA: Diagnosis not present

## 2024-02-17 DIAGNOSIS — E1142 Type 2 diabetes mellitus with diabetic polyneuropathy: Secondary | ICD-10-CM | POA: Diagnosis not present

## 2024-02-17 DIAGNOSIS — Z992 Dependence on renal dialysis: Secondary | ICD-10-CM | POA: Diagnosis not present

## 2024-02-17 DIAGNOSIS — E1122 Type 2 diabetes mellitus with diabetic chronic kidney disease: Secondary | ICD-10-CM | POA: Diagnosis not present

## 2024-02-17 LAB — POCT GLYCOSYLATED HEMOGLOBIN (HGB A1C): Hemoglobin A1C: 8.5 % — AB (ref 4.0–5.6)

## 2024-02-17 MED ORDER — FREESTYLE LIBRE 3 PLUS SENSOR MISC: 1.0000 | 3 refills | Status: AC

## 2024-02-17 MED ORDER — INSULIN LISPRO (1 UNIT DIAL) 100 UNIT/ML (KWIKPEN): 3.0000 [IU] | PEN_INJECTOR | Freq: Three times a day (TID) | SUBCUTANEOUS | 3 refills | Status: AC

## 2024-02-17 MED ORDER — LANTUS SOLOSTAR 100 UNIT/ML ~~LOC~~ SOPN: 3.0000 [IU] | PEN_INJECTOR | Freq: Every day | SUBCUTANEOUS | 3 refills | Status: AC

## 2024-02-17 MED ORDER — INSULIN PEN NEEDLE 32G X 4 MM MISC: 1.0000 | Freq: Four times a day (QID) | 3 refills | Status: AC

## 2024-02-17 NOTE — Progress Notes (Signed)
 Name: Tyler Patterson  MRN/ DOB: [id number], 09-Dec-1950   Age/ Sex: 73 y.o., male    PCP: Valma Carwin, MD   Reason for Endocrinology Evaluation: Type 2 Diabetes Mellitus     Date of Initial Endocrinology Visit: 06/13/2021    PATIENT IDENTIFIER: Tyler Patterson is a 73 y.o. male with a past medical history of T2DM and HTN, ESRD , Hx of alcohol abuse . The patient presented for initial endocrinology clinic visit on 06/13/2021 for consultative assistance with his diabetes management.    HPI:  Diagnosed with DM in 2011 Prior Medications tried/Intolerance: Novolog /Lantus  stopped during the pandemic due to loss of transportation .             Hemoglobin A1c has ranged from 8.0% in 2022, peaking at 13.5% in 2018. Patient required assistance for hypoglycemia: yes - 2021   On his initial visit to our clinic his A1c was 11.0%, he was started on prandial insulin  per correction scale due to fear of hypoglycemia   Started Rybelsus  05/2022 with an A1c of 9.5% but this was discontinued due to weight loss    SUBJECTIVE:   During the last visit (11/04/2023): A1c 9.4%     Today (02/17/24): Tyler Patterson is here for follow-up on diabetes management . He checks his blood sugars multiple times daily, through freestyle libre.    On HD :Monday, Wednesday and Friday -follows [facility]  No constipation or diarrhea  No nausea or vomiting    HOME DIABETES REGIMEN: Humalog  3 units with a meal- takes  Lantus  5 units daily- he takes 2 units    Statin: yes ACE-I/ARB: yes   CONTINUOUS GLUCOSE MONITORING RECORD INTERPRETATION    Dates of Recording: 11/6 - 02/16/2024  Sensor description: Freestyle libre  Results statistics:   CGM use % of time 59  Average and SD 199/31.2  Time in range      41%  % Time Above 180 41  % Time above 250 18  % Time Below target 0   Glycemic patterns summary: BGs fluctuate throughout the day and night  Hyperglycemic episodes  postprandial  Hypoglycemic episodes occurred N/A  Overnight periods: Variable without a pattern   DIABETIC COMPLICATIONS: Microvascular complications:  ESRD on HD, neuropathy Denies: retinopathy Last eye exam: Completed 04/13/2023 Macrovascular complications:   Denies: CAD, PVD, CVA   PAST HISTORY: Past Medical History:  Past Medical History:  Diagnosis Date   Alcohol abuse    Anemia    Chronic kidney disease    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   COPD (chronic obstructive pulmonary disease) (HCC)    Depression    Diabetes mellitus    type 2   Headache 05/13/2013   Headache(784.0)    Hypertension    PONV (postoperative nausea and vomiting)    Spinal stenosis    04/25/21 MRI ([facility]): Neurogenic claudication secondary to spinal stenosis at L3-4 moderate to severe. Lateral recess stenosis L5-S1 to the left. Moderate stenosis at L4-5 with transitional anatomy   Stroke [facility])    chronic right cerebellar infarct & lacunar infarcts within thalamus and bilateral basal ganglia 10/24/21 CT   Past Surgical History:  Past Surgical History:  Procedure Laterality Date   AV FISTULA PLACEMENT Left 05/28/2021   Procedure: LEFT BRACHIOBASILIC ARTERIOVENOUS FISTULA CREATION;  Surgeon: Magda Debby SAILOR, MD;  Location: [facility];  Service: Vascular;  Laterality: Left;  PERIPHERAL NERVE BLOCK   BASCILIC VEIN TRANSPOSITION Left 07/28/2021   Procedure: SECOND STAGE BASILIC VEIN FISTULA;  Surgeon: Magda Debby SAILOR, MD;  Location: [facility];  Service: Vascular;  Laterality: Left;  PERIPHERAL NERVE BLOCK   COLON SURGERY     Hemicolectomy for benign colon mass   HERNIA REPAIR     INCISIONAL HERNIA REPAIR N/A 01/14/2021   Procedure: REPAIR OF INCISIONAL HERNIA WITH MESH;  Surgeon: Vanderbilt Debby, MD;  Location: [facility];  Service: General;  Laterality: N/A;    INGUINAL HERNIA REPAIR Right 03/19/2022   Procedure: RIGHT INGUINAL HERNIA REPAIR WITH MESH;  Surgeon: Vanderbilt Debby, MD;  Location: [facility];  Service: General;  Laterality: Right;   INSERTION OF MESH Right 03/19/2022   Procedure: INSERTION OF MESH;  Surgeon: Vanderbilt Debby, MD;  Location: [facility];  Service: General;  Laterality: Right;    Social History:  reports that he quit smoking about 2 years ago. His smoking use included cigars and cigarettes. He has never used smokeless tobacco. He reports that he does not currently use alcohol. He reports that he does not use drugs. Family History:  Family History  Problem Relation Age of Onset   Lung cancer Mother    Lung cancer Father    Diabetes Mellitus II Neg Hx      HOME MEDICATIONS: Allergies as of 02/17/2024       Reactions   Duloxetine     Other reaction(s): Abdominal pain   Fiorinal [butalbital-aspirin -caffeine] Swelling   Spironolactone     Other reaction(s): Gynecomastia        Medication List        Accurate as of February 17, 2024  9:10 AM. If you have any questions, ask your nurse or doctor.          aspirin  EC 81 MG tablet Take 81 mg by mouth daily. Swallow whole.   atorvastatin 40 MG tablet Commonly known as: LIPITOR Take 40 mg by mouth daily.   BD Pen Needle Nano U/F 32G X 4 MM Misc Generic drug: Insulin  Pen Needle 1 Device by Does not apply route in the morning, at noon, in the evening, and at bedtime.   Dialyvite 5000 5 MG Tabs Take 5 mg by mouth daily.   Dialyvite 800 0.8 MG Tabs Take by mouth.   FreeStyle Libre 3 Plus Sensor Misc 1 Device by Other route every 14 (fourteen) days. Change sensor every 15 days.   FreeStyle Libre 3 Reader Devi 1 Device by Does not apply route daily in the afternoon.   furosemide  80 MG tablet Commonly known as: LASIX  Take 80 mg by mouth 2 (two) times daily.   insulin  lispro 100 UNIT/ML KwikPen Commonly known as: HUMALOG  Inject 3 Units into the skin 3 (three)  times daily.   Lantus  SoloStar 100 UNIT/ML Solostar Pen Generic drug: insulin  glargine Inject 5 Units into the skin daily.   lidocaine  5 % Commonly known as: LIDODERM  Place onto the skin.   OneTouch Verio test strip Generic drug: glucose blood 1 each by Other route 3 (three) times daily. Use as instructed   oxyCODONE -acetaminophen  5-325 MG tablet Commonly known as: PERCOCET/ROXICET Take 1 tablet by mouth every 6 (six) hours as needed.   vitamin D3 50 MCG (2000 UT) Caps Take 2,000 Units by mouth daily.         ALLERGIES: Allergies  Allergen Reactions   Duloxetine      Other  reaction(s): Abdominal pain   Fiorinal [Butalbital-Aspirin -Caffeine] Swelling   Spironolactone      Other reaction(s): Gynecomastia     REVIEW OF SYSTEMS: A comprehensive ROS was conducted with the patient and is negative except as per HPI     OBJECTIVE:   VITAL SIGNS: BP (!) 144/58   Pulse 66   Resp 18   Ht 5' 8 (1.727 m)   Wt 146 lb (66.2 kg)   SpO2 99%   BMI 22.20 kg/m    PHYSICAL EXAM:  General: Pt appears well and is in NAD  Lungs: Clear with good BS bilat   Heart: RRR   Neuro: MS is good with appropriate affect, pt is alert and Ox3   DM Foot Exam 02/17/2024  The skin of the feet  without sores or ulcerations. The pedal pulses are undetectable The sensation is intact to a screening 5.07, 10 gram monofilament bilaterally    DATA REVIEWED:  Lab Results  Component Value Date   HGBA1C 9.4 (A) 11/04/2023   HGBA1C 8.5 (A) 08/03/2023   HGBA1C 7.9 (A) 12/22/2022    Labs through the [place] [facility] 03/16/2023  Calcium  10.1 Glucose 230 mg/DL Potassium 3.7 GFR 12 Creatinine 4.71  HDL 82.6 Triglycerides 83 LDL 58 A1c 10.4%    In office BG 131 Mg/DL   ASSESSMENT / PLAN / RECOMMENDATIONS:   1) Type 2 Diabetes Mellitus, poorly controlled, With CKD ESRD on HD and neuropathic complications - Most recent A1c of 8.5 %. Goal A1c <7.5%.    -A1c has trended down but continues  to be above goal -Rybelsus  was discontinued by another provider due to weight loss in July 2024, - limited oral glycemic agents due to ESRD - The patient is set in his own waye, and despite multiple attempts at adjusting his basal/prandial dose, the patient continues to use the same doses - I will attempt to increase insulin  again as below - I again emphasized the importance of taking Humalog  with meals - The patient likes to take the same amount of units for both Lantus  and Humalog , so we will make those similar   MEDICATIONS: Take Lantus  3 units a day Take Humalog  3 units with a meal     EDUCATION / INSTRUCTIONS: BG monitoring instructions: Patient is instructed to check his blood sugars 3 times a day, before meals. Call [facility] if: BG persistently < 70  I reviewed the Rule of 15 for the treatment of hypoglycemia in detail with the patient. Literature supplied.   2) Diabetic complications:  Eye: Does not have known diabetic retinopathy.  Neuro/ Feet: Does not have known diabetic peripheral neuropathy. Renal: Patient does  have known baseline CKD. He is  on an ACEI/ARB at present.    Follow-up in 3 months    Signed electronically by: Stefano Redgie Butts, MD  [facility]  [facility] [address] [place], [place] [address] Phone: [phone] FAX: [phone]   CC: Valma Carwin, MD [address] [place] [address] Phone: [phone]  Fax: [phone]    Return to Endocrinology clinic as below: No future appointments.

## 2024-02-17 NOTE — Patient Instructions (Addendum)
 Take  Lantus  3 units ONCE daily  Take Humalog  3 units with EACH meals     HOW TO TREAT LOW BLOOD SUGARS (Blood sugar LESS THAN 70 MG/DL) Please follow the RULE OF 15 for the treatment of hypoglycemia treatment (when your (blood sugars are less than 70 mg/dL)   STEP 1: Take 15 grams of carbohydrates when your blood sugar is low, which includes:  3-4 GLUCOSE TABS  OR 3-4 OZ OF JUICE OR REGULAR SODA OR ONE TUBE OF GLUCOSE GEL    STEP 2: RECHECK blood sugar in 15 MINUTES STEP 3: If your blood sugar is still low at the 15 minute recheck --> then, go back to STEP 1 and treat AGAIN with another 15 grams of carbohydrates.

## 2024-02-28 ENCOUNTER — Emergency Department (HOSPITAL_COMMUNITY)

## 2024-02-28 ENCOUNTER — Emergency Department (HOSPITAL_COMMUNITY)
Admission: EM | Admit: 2024-02-28 | Discharge: 2024-02-28 | Disposition: A | Discharge status: Home / Self Care | Attending: Emergency Medicine | Admitting: Emergency Medicine

## 2024-02-28 ENCOUNTER — Encounter (HOSPITAL_COMMUNITY): Payer: Self-pay

## 2024-02-28 ENCOUNTER — Other Ambulatory Visit: Payer: Self-pay

## 2024-02-28 DIAGNOSIS — J449 Chronic obstructive pulmonary disease, unspecified: Secondary | ICD-10-CM | POA: Diagnosis not present

## 2024-02-28 DIAGNOSIS — M5442 Lumbago with sciatica, left side: Secondary | ICD-10-CM | POA: Diagnosis not present

## 2024-02-28 DIAGNOSIS — M5136 Other intervertebral disc degeneration, lumbar region with discogenic back pain only: Secondary | ICD-10-CM | POA: Diagnosis not present

## 2024-02-28 DIAGNOSIS — Z794 Long term (current) use of insulin: Secondary | ICD-10-CM | POA: Insufficient documentation

## 2024-02-28 DIAGNOSIS — G8929 Other chronic pain: Secondary | ICD-10-CM | POA: Insufficient documentation

## 2024-02-28 DIAGNOSIS — Z992 Dependence on renal dialysis: Secondary | ICD-10-CM | POA: Diagnosis not present

## 2024-02-28 DIAGNOSIS — I120 Hypertensive chronic kidney disease with stage 5 chronic kidney disease or end stage renal disease: Secondary | ICD-10-CM | POA: Diagnosis not present

## 2024-02-28 DIAGNOSIS — N186 End stage renal disease: Secondary | ICD-10-CM | POA: Diagnosis not present

## 2024-02-28 DIAGNOSIS — Z79899 Other long term (current) drug therapy: Secondary | ICD-10-CM | POA: Insufficient documentation

## 2024-02-28 DIAGNOSIS — M545 Low back pain, unspecified: Secondary | ICD-10-CM | POA: Diagnosis present

## 2024-02-28 DIAGNOSIS — E1122 Type 2 diabetes mellitus with diabetic chronic kidney disease: Secondary | ICD-10-CM | POA: Insufficient documentation

## 2024-02-28 DIAGNOSIS — Z7982 Long term (current) use of aspirin: Secondary | ICD-10-CM | POA: Insufficient documentation

## 2024-02-28 LAB — BASIC METABOLIC PANEL WITH GFR
Anion gap: 17 — ABNORMAL HIGH (ref 5–15)
BUN: 78 mg/dL — ABNORMAL HIGH (ref 8–23)
CO2: 22 mmol/L (ref 22–32)
Calcium: 8.9 mg/dL (ref 8.9–10.3)
Chloride: 97 mmol/L — ABNORMAL LOW (ref 98–111)
Creatinine, Ser: 10.97 mg/dL — ABNORMAL HIGH (ref 0.61–1.24)
GFR, Estimated: 4 mL/min — ABNORMAL LOW (ref >60)
Glucose, Bld: 83 mg/dL (ref 70–99)
Potassium: 4.3 mmol/L (ref 3.5–5.1)
Sodium: 136 mmol/L (ref 135–145)

## 2024-02-28 LAB — CBC WITH DIFFERENTIAL/PLATELET
Abs Immature Granulocytes: 0.01 K/uL (ref 0.00–0.07)
Basophils Absolute: 0 K/uL (ref 0.0–0.1)
Basophils Relative: 0 %
Eosinophils Absolute: 0.1 K/uL (ref 0.0–0.5)
Eosinophils Relative: 2 %
HCT: 34.9 % — ABNORMAL LOW (ref 39.0–52.0)
Hemoglobin: 11.3 g/dL — ABNORMAL LOW (ref 13.0–17.0)
Immature Granulocytes: 0 %
Lymphocytes Relative: 38 %
Lymphs Abs: 1.9 K/uL (ref 0.7–4.0)
MCH: 31.7 pg (ref 26.0–34.0)
MCHC: 32.4 g/dL (ref 30.0–36.0)
MCV: 98 fL (ref 80.0–100.0)
Monocytes Absolute: 0.6 K/uL (ref 0.1–1.0)
Monocytes Relative: 11 %
Neutro Abs: 2.4 K/uL (ref 1.7–7.7)
Neutrophils Relative %: 49 %
Platelets: 169 K/uL (ref 150–400)
RBC: 3.56 MIL/uL — ABNORMAL LOW (ref 4.22–5.81)
RDW: 14.6 % (ref 11.5–15.5)
WBC: 5 K/uL (ref 4.0–10.5)
nRBC: 0 % (ref 0.0–0.2)

## 2024-02-28 MED ORDER — OXYCODONE-ACETAMINOPHEN 5-325 MG PO TABS: 1.0000 | ORAL_TABLET | Freq: Four times a day (QID) | ORAL | 0 refills | Status: AC | PRN

## 2024-02-28 MED ORDER — OXYCODONE-ACETAMINOPHEN 5-325 MG PO TABS
1.0000 | ORAL_TABLET | Freq: Once | ORAL | Status: AC
  Administered 2024-02-28: 1 via ORAL
  Filled 2024-02-28: qty 1

## 2024-02-28 MED ORDER — CYCLOBENZAPRINE HCL 10 MG PO TABS
5.0000 mg | ORAL_TABLET | Freq: Once | ORAL | Status: AC
  Administered 2024-02-28: 5 mg via ORAL
  Filled 2024-02-28: qty 1

## 2024-02-28 MED ORDER — CYCLOBENZAPRINE HCL 10 MG PO TABS: 10.0000 mg | ORAL_TABLET | Freq: Two times a day (BID) | ORAL | 0 refills | Status: AC | PRN

## 2024-02-28 MED ORDER — LIDOCAINE 5 % EX PTCH
1.0000 | MEDICATED_PATCH | CUTANEOUS | Status: DC
  Administered 2024-02-28: 1 via TRANSDERMAL
  Filled 2024-02-28: qty 1

## 2024-02-28 NOTE — ED Provider Notes (Signed)
 [facility] Provider Note   CSN: [id number] Arrival date & time: 02/28/24  9493     Patient presents with: Back Pain and Leg Pain   Tyler Patterson is a 73 y.o. male.    Back Pain Associated symptoms: leg pain   Leg Pain Associated symptoms: back pain      73 year old male with medical history significant for DM 2, HTN, COPD, ESRD on HD who presents to the emergency department with a chief complaint of subacute back pain.  The patient states that he fell 4 weeks ago.  He has had chronic back pain but it has worsened.  He endorses some pain in the midline of his neck with range of motion, denies any fevers or neck rigidity.  Was seen by outpatient orthopedics and had x-ray imaging which showed degenerative changes in the lower back.  He was having some weakness during his office visit on 02/15/2024.  He states that given his pain he has been unable to go to dialysis and missed his last 2 dialysis sessions last week.  He was last dialyzed last Sunday on 11/23.  He denies any chest pain or shortness of breath.  He endorses persistent pain in his low back with radiation of pain down his left leg.  He denies any weakness or numbness, states that he having difficulty moving his leg more due to pain.  He denies any saddle anesthesia.  He no longer makes urine.  Prior to Admission medications   Medication Sig Start Date End Date Taking? Authorizing Provider  cyclobenzaprine  (FLEXERIL ) 10 MG tablet Take 1 tablet (10 mg total) by mouth 2 (two) times daily as needed for muscle spasms. 02/28/24  Yes Jerrol Agent, MD  oxyCODONE -acetaminophen  (PERCOCET/ROXICET) 5-325 MG tablet Take 1 tablet by mouth every 6 (six) hours as needed for severe pain (pain score 7-10). 02/28/24  Yes Jerrol Agent, MD  aspirin  EC 81 MG tablet Take 81 mg by mouth daily. Swallow whole.    [provider]  atorvastatin (LIPITOR) 40 MG tablet Take 40 mg by mouth daily.     [provider]  B Complex-C-Biotin-E-Min-FA (DIALYVITE 5000) 5 MG TABS Take 5 mg by mouth daily. 12/29/21   [provider]  B Complex-C-Folic Acid  (DIALYVITE 800) 0.8 MG TABS Take by mouth. 07/03/22   [provider]  Cholecalciferol (VITAMIN D3) 50 MCG (2000 UT) CAPS Take 2,000 Units by mouth daily.    [provider]  Continuous Glucose Sensor (FREESTYLE LIBRE 3 PLUS SENSOR) MISC 1 Device by Other route every 14 (fourteen) days. Change sensor every 15 days. 02/17/24   Shamleffer, Ibtehal Jaralla, MD  furosemide  (LASIX ) 80 MG tablet Take 80 mg by mouth 2 (two) times daily.    [provider]  glucose blood (ONETOUCH VERIO) test strip 1 each by Other route 3 (three) times daily. Use as instructed 06/13/21   Shamleffer, Ibtehal Jaralla, MD  insulin  glargine (LANTUS  SOLOSTAR) 100 UNIT/ML Solostar Pen Inject 3 Units into the skin daily. 02/17/24   Shamleffer, Ibtehal Jaralla, MD  insulin  lispro (HUMALOG ) 100 UNIT/ML KwikPen Inject 3 Units into the skin 3 (three) times daily. 02/17/24   Shamleffer, Ibtehal Jaralla, MD  Insulin  Pen Needle 32G X 4 MM MISC 1 Device by Does not apply route in the morning, at noon, in the evening, and at bedtime. 02/17/24   Shamleffer, Ibtehal Jaralla, MD  lidocaine  (LIDODERM ) 5 % Place onto the skin. 07/03/22   [provider]    Allergies: Duloxetine , Fiorinal [butalbital-aspirin -caffeine], and Spironolactone     Review of Systems  Musculoskeletal:  Positive for back pain.  All other systems reviewed and are negative.   Updated Vital Signs BP (!) 157/62 (BP Location: Right Arm)   Pulse 60   Temp 97.6 F (36.4 C) (Oral)   Resp 16   Ht 5' 8 (1.727 m)   Wt 65.8 kg   SpO2 100%   BMI 22.05 kg/m   Physical Exam Vitals and nursing note reviewed.  Constitutional:      General: He is not in acute distress.    Appearance: He is well-developed.  HENT:     Head: Normocephalic and atraumatic.  Eyes:      Conjunctiva/sclera: Conjunctivae normal.  Cardiovascular:     Rate and Rhythm: Normal rate and regular rhythm.     Heart sounds: No murmur heard. Pulmonary:     Effort: Pulmonary effort is normal. No respiratory distress.     Breath sounds: Normal breath sounds.  Abdominal:     Palpations: Abdomen is soft.     Tenderness: There is no abdominal tenderness.  Musculoskeletal:        General: No swelling.     Cervical back: Neck supple.     Comments: Mild TTP of the cervical spine, mild midline TTP of the lower lumbar spine, positive straight leg raise test on the left.   Skin:    General: Skin is warm and dry.     Capillary Refill: Capillary refill takes less than 2 seconds.  Neurological:     Mental Status: He is alert.     Comments: 5/5 and sensation intact in the bilateral upper extremities. 4/5 strength in the LLE, seems more due to effort dependence than true weakness, 5/5 strength on thigh extension, 5/5 strength in the RLE, intact sensation to light touch in the bilateral lower extremities  Psychiatric:        Mood and Affect: Mood normal.     (all labs ordered are listed, but only abnormal results are displayed) Labs Reviewed  CBC WITH DIFFERENTIAL/PLATELET - Abnormal; Notable for the following components:      Result Value   RBC 3.56 (*)    Hemoglobin 11.3 (*)    HCT 34.9 (*)    All other components within normal limits  BASIC METABOLIC PANEL WITH GFR - Abnormal; Notable for the following components:   Chloride 97 (*)    BUN 78 (*)    Creatinine, Ser 10.97 (*)    GFR, Estimated 4 (*)    Anion gap 17 (*)    All other components within normal limits    EKG: None  Radiology: CT Lumbar Spine Wo Contrast Result Date: 02/28/2024 EXAM: CT OF THE LUMBAR SPINE WITHOUT CONTRAST 02/28/2024 06:32:29 AM TECHNIQUE: CT of the lumbar spine was performed without the administration of intravenous contrast. Multiplanar reformatted images are provided for review. Automated exposure  control, iterative reconstruction, and/or weight based adjustment of the mA/kV was utilized to reduce the radiation dose to as low as reasonably achievable. COMPARISON: Lumbar spine series dated 02/15/2024. CLINICAL HISTORY: Back trauma, no prior imaging (Age >= 16y); Subacute back pain, radiculopathy on the left, fall 4 weeks ago. FINDINGS: BONES AND ALIGNMENT: Normal vertebral body heights. No acute fracture or suspicious bone lesion. There is mild dextroscoliosis of the thoracolumbar spine. DEGENERATIVE CHANGES: * **L2-L3:** Moderate chronic degenerative disc disease with endplate erosions and disc space narrowing. Broad-based disc bulging and moderate  bilateral facet arthrosis, resulting in moderate central spinal canal stenosis and bilateral lateral recess stenosis, with likely impingement of the L3 nerves in the lateral recesses. * **L3-L4:** Mild diffuse disc bulging. Degenerative cysts within the inferior endplate of L3. Bilateral facet hypertrophic changes contributing to moderate central spinal canal stenosis and moderate bilateral lateral recess stenosis. Compression of the thecal sac and likely compression of the L4 nerves in the lateral recesses. * **L4-L5:** Broad-based disc bulge eccentric to the left. Moderate bilateral facet arthrosis. Cystic lesion present within the right lamina, measuring approximately 8 x 6 mm. Moderate central spinal canal stenosis and moderate left lateral recess and neural foraminal stenosis with possible impingement of the left L5 nerve in the lateral recess. * **L5-S1:** Mild facet hypertrophic changes present. The spinal canal and neural foramina appear patent. SOFT TISSUES: No acute abnormality. IMPRESSION: 1. Moderate chronic degenerative disc disease at L2-3 with endplate erosions, disc space narrowing, broad-based disc bulging, and moderate bilateral facet arthrosis, resulting in moderate central spinal canal stenosis and bilateral lateral recess stenosis with likely  impingement of the L3 nerves. 2. Mild diffuse disc bulging at L3-4 with degenerative cysts within the inferior endplate of L3 and bilateral facet hypertrophic changes, contributing to moderate central spinal canal stenosis and moderate bilateral lateral recess stenosis with likely compression of the L4 nerves. 3. Broad-based disc bulge at L4-5, eccentric to the left, with moderate bilateral facet arthrosis and a cystic lesion within the right lamina (approximately 8 x 6 mm), resulting in moderate central spinal canal stenosis and moderate left lateral recess and neural foraminal stenosis with possible impingement of the left L5 nerve. 4. Mild facet hypertrophic changes at L5-S1 with patent spinal canal and neural foramina. Electronically signed by: Evalene Coho MD 02/28/2024 06:58 AM EST RP Workstation: [id number]   CT Cervical Spine Wo Contrast Result Date: 02/28/2024 EXAM: CT CERVICAL SPINE WITHOUT CONTRAST 02/28/2024 06:32:29 AM TECHNIQUE: CT of the cervical spine was performed without the administration of intravenous contrast. Multiplanar reformatted images are provided for review. Automated exposure control, iterative reconstruction, and/or weight based adjustment of the mA/kV was utilized to reduce the radiation dose to as low as reasonably achievable. COMPARISON: CTA of the cervical spine dated 02/27/2017. CLINICAL HISTORY: Neck trauma (Age >= 65y). FINDINGS: CERVICAL SPINE: BONES AND ALIGNMENT: No acute fracture or traumatic malalignment. DEGENERATIVE CHANGES: At C3-C4, there is mild central spinal canal stenosis. The neural foramina are patent. At C4-C5, there is mild central disc bulging causing mild central spinal canal stenosis. There is moderate right-sided facet arthrosis. The neural foramina appear patent. At C5-C6, there is diffuse disc bulging and mild left uncovertebral joint hypertrophy, resulting in mild central spinal canal stenosis and mild left neural foraminal stenosis. At C6-C7,  there is diffuse disc bulging and bilateral uncovertebral joint hypertrophy, which is more pronounced on the left. There is mild-to-moderate central spinal canal stenosis, moderate left neural foraminal stenosis and mild right neural foraminal stenosis. SOFT TISSUES: No prevertebral soft tissue swelling. VASCULATURE: There is mild calcification within the carotid bulbs bilaterally. JOINTS: There is mild-to-moderate left temporomandibular arthropathy. IMPRESSION: 1. No acute abnormality of the cervical spine related to the reported neck trauma. 2. Multilevel cervical spondylosis as detailed above, including mild to moderate canal stenosis at C6-7 and multilevel neural foraminal narrowing, greatest on the left at C6-7. 3. Mild calcified atherosclerosis of the carotid bulbs bilaterally. 4. Mild-to-moderate left temporomandibular joint arthropathy. Electronically signed by: Evalene Coho MD 02/28/2024 06:45 AM EST RP Workstation: [id number]  Procedures   Medications Ordered in the ED  oxyCODONE -acetaminophen  (PERCOCET/ROXICET) 5-325 MG per tablet 1 tablet (1 tablet Oral Given 02/28/24 0631)  cyclobenzaprine  (FLEXERIL ) tablet 5 mg (5 mg Oral Given 02/28/24 0631)                                    Medical Decision Making Amount and/or Complexity of Data Reviewed Labs: ordered. Radiology: ordered.  Risk Prescription drug management.     73 year old male with medical history significant for DM 2, HTN, COPD, ESRD on HD who presents to the emergency department with a chief complaint of subacute back pain.  The patient states that he fell 4 weeks ago.  He has had chronic back pain but it has worsened.  He endorses some pain in the midline of his neck with range of motion, denies any fevers or neck rigidity.  Was seen by outpatient orthopedics and had x-ray imaging which showed degenerative changes in the lower back.  He was having some weakness during his office visit on 02/15/2024.  He states that  given his pain he has been unable to go to dialysis and missed his last 2 dialysis sessions last week.  He was last dialyzed last Sunday on 11/23.  He denies any chest pain or shortness of breath.  He endorses persistent pain in his low back with radiation of pain down his left leg.  He denies any weakness or numbness, states that he having difficulty moving his leg more due to pain.  He denies any saddle anesthesia.  He no longer makes urine.  On arrival, the patient was vitally stable, afebrile, nontachycardic, nontachypneic BP 160/77, saturating 100% on room air.  Physical exam positive for straight leg raise test on the left, some effort dependent and pain related weakness in the left lower extremity compared to the right.  Lungs clear to auscultation bilaterally.  Patient has missed dialysis over the past week in the setting of acute on chronic back pain with radiculopathy..  Patient reported multiple falls recently, will obtain CT imaging to further evaluate.  Patient denies any recent head trauma, denies any headache.  Denies shortness of breath or chest pain.  BMP without hyperkalemia, potassium 4.3, BUN 78, creatinine 10.97, CBC without a leukocytosis, mild anemia to 11.3 noted.  Patient was administered Flexeril , Percocet for pain control.  On repeat assessment following these medications, the patient reports feeling symptomatically improved and feels like he will be able to ambulate.  CT cervical spine: IMPRESSION:  1. No acute abnormality of the cervical spine related to the reported neck  trauma.  2. Multilevel cervical spondylosis as detailed above, including mild to  moderate canal stenosis at C6-7 and multilevel neural foraminal narrowing,  greatest on the left at C6-7.  3. Mild calcified atherosclerosis of the carotid bulbs bilaterally.  4. Mild-to-moderate left temporomandibular joint arthropathy.    CT Lumbar spine: IMPRESSION:  1. Moderate chronic degenerative disc disease at  L2-3 with endplate erosions,  disc space narrowing, broad-based disc bulging, and moderate bilateral facet  arthrosis, resulting in moderate central spinal canal stenosis and bilateral  lateral recess stenosis with likely impingement of the L3 nerves.  2. Mild diffuse disc bulging at L3-4 with degenerative cysts within the  inferior endplate of L3 and bilateral facet hypertrophic changes, contributing  to moderate central spinal canal stenosis and moderate bilateral lateral recess  stenosis with likely compression of the  L4 nerves.  3. Broad-based disc bulge at L4-5, eccentric to the left, with moderate  bilateral facet arthrosis and a cystic lesion within the right lamina  (approximately 8 x 6 mm), resulting in moderate central spinal canal stenosis  and moderate left lateral recess and neural foraminal stenosis with possible  impingement of the left L5 nerve.  4. Mild facet hypertrophic changes at L5-S1 with patent spinal canal and neural  foramina.   Patient able to successfully ambulate, did not drop oxygen saturations on room air, low concern for acute need for emergent dialysis given normal electrolytes, no significant hypoxia, clear lungs on exam.  Patient advised to follow-up closely for outpatient dialysis, advised continued outpatient follow-up regarding his degenerative disc disease and sciatica.  Provided with outpatient pain regimen, low concern for cauda equina syndrome at this time, patient without red flag symptoms, overall stable for discharge.     Final diagnoses:  Chronic left-sided low back pain with left-sided sciatica  Degeneration of intervertebral disc of lumbar region with discogenic back pain    ED Discharge Orders          Ordered    cyclobenzaprine  (FLEXERIL ) 10 MG tablet  2 times daily PRN        02/28/24 0738    oxyCODONE -acetaminophen  (PERCOCET/ROXICET) 5-325 MG tablet  Every 6 hours PRN        02/28/24 0738               Jerrol Agent,  MD 02/28/24 1637

## 2024-02-28 NOTE — ED Notes (Signed)
 Pt ambulated for about 3-4 minutes around the the bl/orange pod.  Pt was able to ambulate with a walker and did not present any balance issues.  Pt reports feeling a little winded. Pt 02 is at 100

## 2024-02-28 NOTE — ED Triage Notes (Signed)
 Patient BIB [facility] from home due to back and left leg pain. Patient states this has been going on for a couple months but has not gotten any answers. Patient reports that the pain originates in his lower back and shoots down his left leg, described as a shooting/pins &needs pain. Pain comes and goes. A&Ox4 VSS en route.

## 2024-02-28 NOTE — Discharge Instructions (Addendum)
 Your symptoms are consistent with degenerative disease in your low back with associated sciatica.  Your CT imaging revealed the following: IMPRESSION:  1. Moderate chronic degenerative disc disease at L2-3 with endplate erosions,  disc space narrowing, broad-based disc bulging, and moderate bilateral facet  arthrosis, resulting in moderate central spinal canal stenosis and bilateral  lateral recess stenosis with likely impingement of the L3 nerves.  2. Mild diffuse disc bulging at L3-4 with degenerative cysts within the  inferior endplate of L3 and bilateral facet hypertrophic changes, contributing  to moderate central spinal canal stenosis and moderate bilateral lateral recess  stenosis with likely compression of the L4 nerves.  3. Broad-based disc bulge at L4-5, eccentric to the left, with moderate  bilateral facet arthrosis and a cystic lesion within the right lamina  (approximately 8 x 6 mm), resulting in moderate central spinal canal stenosis  and moderate left lateral recess and neural foraminal stenosis with possible  impingement of the left L5 nerve.  4. Mild facet hypertrophic changes at L5-S1 with patent spinal canal and neural  foramina.   Will treat with pain medication outpatient.  Given your diabetes, we will hold on any steroid treatment for now.  Muscle relaxants have also been prescribed.  Follow-up outpatient with your spine specialist

## 2024-02-28 NOTE — ED Notes (Signed)
 Patient transported to CT

## 2024-03-02 ENCOUNTER — Ambulatory Visit: Attending: Physician Assistant

## 2024-03-02 DIAGNOSIS — M5442 Lumbago with sciatica, left side: Secondary | ICD-10-CM | POA: Diagnosis present

## 2024-03-02 DIAGNOSIS — G8929 Other chronic pain: Secondary | ICD-10-CM | POA: Insufficient documentation

## 2024-03-02 DIAGNOSIS — R262 Difficulty in walking, not elsewhere classified: Secondary | ICD-10-CM | POA: Diagnosis present

## 2024-03-02 DIAGNOSIS — M6281 Muscle weakness (generalized): Secondary | ICD-10-CM | POA: Diagnosis present
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
## 2024-03-09 ENCOUNTER — Telehealth: Payer: Self-pay | Admitting: Physical Therapy

## 2024-03-09 ENCOUNTER — Ambulatory Visit: Admitting: Physical Therapy

## 2024-03-09 NOTE — Telephone Encounter (Signed)
Called and informed patient of missed visit and provided reminder of next appt and attendance policy via VM. 
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
## 2024-03-15 ENCOUNTER — Telehealth: Payer: Self-pay | Admitting: Physician Assistant

## 2024-03-15 NOTE — Telephone Encounter (Signed)
 02/15/24 ov note faxed to Dr. Terrie MD [phone]

## 2024-03-16 ENCOUNTER — Ambulatory Visit
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
## 2024-03-21 ENCOUNTER — Telehealth: Payer: Self-pay

## 2024-03-21 ENCOUNTER — Ambulatory Visit

## 2024-03-21 NOTE — Telephone Encounter (Signed)
 3rd no show appt. Informed pt via VM of DC due to attendance policy, but he can return to PT with another referral from his MD.

## 2024-03-28 ENCOUNTER — Ambulatory Visit
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
## 2024-04-11 ENCOUNTER — Ambulatory Visit

## 2024-04-18 ENCOUNTER — Ambulatory Visit: Admitting: Physical Medicine and Rehabilitation

## 2024-05-03 ENCOUNTER — Other Ambulatory Visit: Payer: Self-pay

## 2024-05-03 DIAGNOSIS — I739 Peripheral vascular disease, unspecified: Secondary | ICD-10-CM

## 2024-05-30 ENCOUNTER — Ambulatory Visit (HOSPITAL_COMMUNITY)

## 2024-05-30 ENCOUNTER — Encounter: Admitting: Vascular Surgery

## 2024-08-17 ENCOUNTER — Ambulatory Visit: Admitting: Internal Medicine
